# Patient Record
Sex: Male | Born: 1935 | Race: White | Hispanic: No | Marital: Married | Smoking: Never smoker
Health system: Southern US, Community
[De-identification: ages and names within clinical notes are randomized; demographics above are authoritative.]

## PROBLEM LIST (undated history)

## (undated) DIAGNOSIS — L409 Psoriasis, unspecified: Secondary | ICD-10-CM

## (undated) DIAGNOSIS — K219 Gastro-esophageal reflux disease without esophagitis: Secondary | ICD-10-CM

## (undated) DIAGNOSIS — I6509 Occlusion and stenosis of unspecified vertebral artery: Secondary | ICD-10-CM

## (undated) DIAGNOSIS — R55 Syncope and collapse: Secondary | ICD-10-CM

## (undated) DIAGNOSIS — I499 Cardiac arrhythmia, unspecified: Secondary | ICD-10-CM

## (undated) DIAGNOSIS — G43909 Migraine, unspecified, not intractable, without status migrainosus: Secondary | ICD-10-CM

## (undated) DIAGNOSIS — D039 Melanoma in situ, unspecified: Secondary | ICD-10-CM

## (undated) DIAGNOSIS — IMO0001 Reserved for inherently not codable concepts without codable children: Secondary | ICD-10-CM

## (undated) DIAGNOSIS — K635 Polyp of colon: Secondary | ICD-10-CM

## (undated) DIAGNOSIS — I493 Ventricular premature depolarization: Secondary | ICD-10-CM

## (undated) DIAGNOSIS — C801 Malignant (primary) neoplasm, unspecified: Secondary | ICD-10-CM

## (undated) DIAGNOSIS — I10 Essential (primary) hypertension: Secondary | ICD-10-CM

## (undated) DIAGNOSIS — G458 Other transient cerebral ischemic attacks and related syndromes: Secondary | ICD-10-CM

## (undated) DIAGNOSIS — Z87442 Personal history of urinary calculi: Secondary | ICD-10-CM

## (undated) DIAGNOSIS — J309 Allergic rhinitis, unspecified: Secondary | ICD-10-CM

## (undated) DIAGNOSIS — H269 Unspecified cataract: Secondary | ICD-10-CM

## (undated) DIAGNOSIS — K649 Unspecified hemorrhoids: Secondary | ICD-10-CM

## (undated) DIAGNOSIS — M109 Gout, unspecified: Secondary | ICD-10-CM

## (undated) DIAGNOSIS — D696 Thrombocytopenia, unspecified: Secondary | ICD-10-CM

## (undated) DIAGNOSIS — B019 Varicella without complication: Secondary | ICD-10-CM

## (undated) DIAGNOSIS — K2270 Barrett's esophagus without dysplasia: Secondary | ICD-10-CM

## (undated) DIAGNOSIS — R7989 Other specified abnormal findings of blood chemistry: Secondary | ICD-10-CM

## (undated) DIAGNOSIS — Z8673 Personal history of transient ischemic attack (TIA), and cerebral infarction without residual deficits: Secondary | ICD-10-CM

## (undated) DIAGNOSIS — H509 Unspecified strabismus: Secondary | ICD-10-CM

## (undated) DIAGNOSIS — N200 Calculus of kidney: Secondary | ICD-10-CM

## (undated) DIAGNOSIS — K7689 Other specified diseases of liver: Secondary | ICD-10-CM

## (undated) DIAGNOSIS — S065XAA Traumatic subdural hemorrhage with loss of consciousness status unknown, initial encounter: Secondary | ICD-10-CM

## (undated) DIAGNOSIS — R161 Splenomegaly, not elsewhere classified: Secondary | ICD-10-CM

## (undated) DIAGNOSIS — H532 Diplopia: Secondary | ICD-10-CM

## (undated) DIAGNOSIS — H50112 Monocular exotropia, left eye: Secondary | ICD-10-CM

## (undated) DIAGNOSIS — H5010 Unspecified exotropia: Secondary | ICD-10-CM

## (undated) DIAGNOSIS — D369 Benign neoplasm, unspecified site: Secondary | ICD-10-CM

## (undated) HISTORY — DX: Other specified diseases of liver: K76.89

## (undated) HISTORY — DX: Unspecified cataract: H26.9

## (undated) HISTORY — DX: Splenomegaly, not elsewhere classified: R16.1

## (undated) HISTORY — DX: Migraine, unspecified, not intractable, without status migrainosus: G43.909

## (undated) HISTORY — DX: Other transient cerebral ischemic attacks and related syndromes: G45.8

## (undated) HISTORY — DX: Benign neoplasm, unspecified site: D36.9

## (undated) HISTORY — DX: Cardiac arrhythmia, unspecified: I49.9

## (undated) HISTORY — DX: Syncope and collapse: R55

## (undated) HISTORY — DX: Unspecified strabismus: H50.9

## (undated) HISTORY — DX: Barrett's esophagus without dysplasia: K22.70

## (undated) HISTORY — DX: Psoriasis, unspecified: L40.9

## (undated) HISTORY — DX: Varicella without complication: B01.9

## (undated) HISTORY — DX: Gastro-esophageal reflux disease without esophagitis: K21.9

## (undated) HISTORY — DX: Personal history of transient ischemic attack (TIA), and cerebral infarction without residual deficits: Z86.73

## (undated) HISTORY — DX: Unspecified hemorrhoids: K64.9

## (undated) HISTORY — DX: Diplopia: H53.2

## (undated) HISTORY — DX: Ventricular premature depolarization: I49.3

## (undated) HISTORY — DX: Calculus of kidney: N20.0

## (undated) HISTORY — DX: Gout, unspecified: M10.9

## (undated) HISTORY — DX: Reserved for inherently not codable concepts without codable children: IMO0001

## (undated) HISTORY — DX: Other specified abnormal findings of blood chemistry: R79.89

## (undated) HISTORY — DX: Polyp of colon: K63.5

## (undated) HISTORY — PX: WISDOM TOOTH EXTRACTION: SHX21

## (undated) HISTORY — DX: Allergic rhinitis, unspecified: J30.9

## (undated) HISTORY — PX: COLONOSCOPY: SHX174

## (undated) HISTORY — DX: Traumatic subdural hemorrhage with loss of consciousness status unknown, initial encounter: S06.5XAA

## (undated) HISTORY — DX: Essential (primary) hypertension: I10

## (undated) HISTORY — DX: Monocular exotropia, left eye: H50.112

## (undated) HISTORY — DX: Melanoma in situ, unspecified: D03.9

## (undated) HISTORY — PX: MOHS SURGERY: SUR867

## (undated) HISTORY — DX: Occlusion and stenosis of unspecified vertebral artery: I65.09

## (undated) HISTORY — PX: TONSILLECTOMY: SUR1361

## (undated) HISTORY — PX: ESOPHAGOGASTRODUODENOSCOPY: SHX1529

## (undated) HISTORY — DX: Thrombocytopenia, unspecified: D69.6

## (undated) HISTORY — DX: Unspecified exotropia: H50.10

## (undated) HISTORY — PX: APPENDECTOMY: SHX54

## (undated) HISTORY — PX: OTHER SURGICAL HISTORY: SHX169

---

## 2003-08-16 HISTORY — PX: EYE SURGERY: SHX253

## 2003-10-18 ENCOUNTER — Other Ambulatory Visit: Payer: Self-pay

## 2005-05-19 ENCOUNTER — Emergency Department: Payer: Self-pay | Admitting: Unknown Physician Specialty

## 2006-04-18 ENCOUNTER — Ambulatory Visit: Payer: Self-pay | Admitting: Gastroenterology

## 2007-07-28 ENCOUNTER — Emergency Department: Payer: Self-pay | Admitting: Emergency Medicine

## 2007-07-31 ENCOUNTER — Emergency Department: Payer: Self-pay | Admitting: Emergency Medicine

## 2007-08-04 ENCOUNTER — Emergency Department: Payer: Self-pay | Admitting: Emergency Medicine

## 2007-08-11 ENCOUNTER — Emergency Department: Payer: Self-pay | Admitting: Emergency Medicine

## 2007-08-25 ENCOUNTER — Emergency Department: Payer: Self-pay | Admitting: Emergency Medicine

## 2008-06-21 ENCOUNTER — Ambulatory Visit: Payer: Self-pay | Admitting: Gastroenterology

## 2010-09-03 DIAGNOSIS — D369 Benign neoplasm, unspecified site: Secondary | ICD-10-CM

## 2010-09-03 HISTORY — DX: Benign neoplasm, unspecified site: D36.9

## 2010-12-29 ENCOUNTER — Ambulatory Visit: Payer: Self-pay | Admitting: Gastroenterology

## 2011-01-01 LAB — PATHOLOGY REPORT

## 2012-08-12 ENCOUNTER — Ambulatory Visit: Payer: Self-pay | Admitting: Gastroenterology

## 2012-08-12 LAB — CBC WITH DIFFERENTIAL/PLATELET
Basophil %: 1.2 %
Eosinophil %: 2.9 %
HGB: 17.8 g/dL (ref 13.0–18.0)
Lymphocyte #: 1 10*3/uL (ref 1.0–3.6)
Lymphocyte %: 16.5 %
MCH: 31.2 pg (ref 26.0–34.0)
MCV: 90 fL (ref 80–100)
Monocyte #: 0.6 x10 3/mm (ref 0.2–1.0)
Monocyte %: 9.4 %
Neutrophil %: 70 %
RBC: 5.7 10*6/uL (ref 4.40–5.90)
RDW: 13.2 % (ref 11.5–14.5)

## 2012-08-13 LAB — PATHOLOGY REPORT

## 2014-03-04 ENCOUNTER — Ambulatory Visit: Payer: Self-pay | Admitting: Gastroenterology

## 2014-03-25 ENCOUNTER — Ambulatory Visit: Payer: Self-pay | Admitting: Gastroenterology

## 2014-06-03 ENCOUNTER — Observation Stay: Payer: Self-pay | Admitting: Internal Medicine

## 2014-06-03 LAB — BASIC METABOLIC PANEL
Anion Gap: 5 — ABNORMAL LOW (ref 7–16)
BUN: 18 mg/dL (ref 7–18)
Calcium, Total: 8.2 mg/dL — ABNORMAL LOW (ref 8.5–10.1)
Chloride: 106 mmol/L (ref 98–107)
Co2: 31 mmol/L (ref 21–32)
Creatinine: 1.24 mg/dL (ref 0.60–1.30)
EGFR (African American): >60
EGFR (Non-African Amer.): 60 — ABNORMAL LOW
GLUCOSE: 121 mg/dL — AB (ref 65–99)
Osmolality: 286 (ref 275–301)
Potassium: 3.7 mmol/L (ref 3.5–5.1)
Sodium: 142 mmol/L (ref 136–145)

## 2014-06-03 LAB — CBC WITH DIFFERENTIAL/PLATELET
BASOS PCT: 0.3 %
Basophil #: 0 10*3/uL (ref 0.0–0.1)
EOS ABS: 0.1 10*3/uL (ref 0.0–0.7)
Eosinophil %: 2.3 %
HCT: 53.8 % — AB (ref 40.0–52.0)
HGB: 17.5 g/dL (ref 13.0–18.0)
LYMPHS ABS: 0.6 10*3/uL — AB (ref 1.0–3.6)
LYMPHS PCT: 12.5 %
MCH: 30.6 pg (ref 26.0–34.0)
MCHC: 32.4 g/dL (ref 32.0–36.0)
MCV: 94 fL (ref 80–100)
MONOS PCT: 8 %
Monocyte #: 0.4 x10 3/mm (ref 0.2–1.0)
NEUTROS ABS: 3.9 10*3/uL (ref 1.4–6.5)
Neutrophil %: 76.9 %
Platelet: 99 10*3/uL — ABNORMAL LOW (ref 150–440)
RBC: 5.71 10*6/uL (ref 4.40–5.90)
RDW: 13.8 % (ref 11.5–14.5)
WBC: 5.1 10*3/uL (ref 3.8–10.6)

## 2014-06-03 LAB — TROPONIN I

## 2014-06-03 LAB — URINALYSIS, COMPLETE
BACTERIA: NONE SEEN
Bilirubin,UR: NEGATIVE
Blood: NEGATIVE
GLUCOSE, UR: NEGATIVE mg/dL (ref 0–75)
KETONE: NEGATIVE
Leukocyte Esterase: NEGATIVE
NITRITE: NEGATIVE
PROTEIN: NEGATIVE
Ph: 5 (ref 4.5–8.0)
RBC,UR: 1 /HPF (ref 0–5)
Specific Gravity: 1.013 (ref 1.003–1.030)
Squamous Epithelial: NONE SEEN

## 2014-06-04 DIAGNOSIS — R55 Syncope and collapse: Secondary | ICD-10-CM

## 2014-06-04 DIAGNOSIS — G458 Other transient cerebral ischemic attacks and related syndromes: Secondary | ICD-10-CM

## 2014-06-04 DIAGNOSIS — I361 Nonrheumatic tricuspid (valve) insufficiency: Secondary | ICD-10-CM

## 2014-06-04 LAB — TSH: THYROID STIMULATING HORM: 2.65 u[IU]/mL

## 2014-06-04 LAB — LIPID PANEL
Cholesterol: 109 mg/dL (ref 0–200)
HDL Cholesterol: 23 mg/dL — ABNORMAL LOW (ref 40–60)
Ldl Cholesterol, Calc: 54 mg/dL (ref 0–100)
TRIGLYCERIDES: 160 mg/dL (ref 0–200)
VLDL CHOLESTEROL, CALC: 32 mg/dL (ref 5–40)

## 2014-06-04 LAB — HEMOGLOBIN A1C: HEMOGLOBIN A1C: 5.4 % (ref 4.2–6.3)

## 2014-06-04 LAB — TROPONIN I: Troponin-I: <0.02 ng/mL

## 2014-06-10 ENCOUNTER — Ambulatory Visit (INDEPENDENT_AMBULATORY_CARE_PROVIDER_SITE_OTHER): Payer: Medicare Other | Admitting: Physician Assistant

## 2014-06-10 ENCOUNTER — Encounter: Payer: Self-pay | Admitting: Physician Assistant

## 2014-06-10 VITALS — BP 120/90 | HR 88 | Ht 71.0 in | Wt 168.0 lb

## 2014-06-10 DIAGNOSIS — D696 Thrombocytopenia, unspecified: Secondary | ICD-10-CM

## 2014-06-10 DIAGNOSIS — G458 Other transient cerebral ischemic attacks and related syndromes: Secondary | ICD-10-CM

## 2014-06-10 DIAGNOSIS — R55 Syncope and collapse: Secondary | ICD-10-CM

## 2014-06-10 MED ORDER — CARVEDILOL 3.125 MG PO TABS: 3.1250 mg | ORAL_TABLET | Freq: Two times a day (BID) | ORAL | Status: DC

## 2014-06-10 NOTE — Patient Instructions (Signed)
Please start Carvedilol 3.125mg  twice daily  Please keep your appt w/ vascular this afternoon  Please follow up with Dr. Rockey Situ in 1 month
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
## 2014-06-14 ENCOUNTER — Encounter: Payer: Self-pay | Admitting: Cardiovascular Disease

## 2014-06-16 ENCOUNTER — Ambulatory Visit: Payer: Self-pay | Admitting: Vascular Surgery

## 2014-07-12 ENCOUNTER — Ambulatory Visit (INDEPENDENT_AMBULATORY_CARE_PROVIDER_SITE_OTHER): Payer: Medicare Other | Admitting: Cardiovascular Disease

## 2014-07-12 ENCOUNTER — Encounter: Payer: Self-pay | Admitting: Cardiovascular Disease

## 2014-07-12 VITALS — BP 150/80 | HR 82 | Ht 71.0 in | Wt 173.8 lb

## 2014-07-12 DIAGNOSIS — I499 Cardiac arrhythmia, unspecified: Secondary | ICD-10-CM

## 2014-07-12 DIAGNOSIS — I493 Ventricular premature depolarization: Secondary | ICD-10-CM | POA: Insufficient documentation

## 2014-07-12 DIAGNOSIS — I6501 Occlusion and stenosis of right vertebral artery: Secondary | ICD-10-CM

## 2014-07-12 DIAGNOSIS — I6509 Occlusion and stenosis of unspecified vertebral artery: Secondary | ICD-10-CM | POA: Insufficient documentation

## 2014-07-12 DIAGNOSIS — R55 Syncope and collapse: Secondary | ICD-10-CM | POA: Insufficient documentation

## 2014-07-12 DIAGNOSIS — I491 Atrial premature depolarization: Secondary | ICD-10-CM | POA: Insufficient documentation

## 2014-07-12 NOTE — Assessment & Plan Note (Signed)
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 He has had follow-up with vascular surgery. Medical management recommended. This is likely the cause of his recent near syncope episode. No further workup needed at this time.
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
## 2014-07-12 NOTE — Assessment & Plan Note (Signed)
Holter monitor reviewed with him in detail. Ectopy shown to him. He does not want beta blockers at this time as he is asymptomatic

## 2014-07-12 NOTE — Patient Instructions (Signed)
You are doing well. No medication changes were made.  Please call for more episodes of lightheadedness,  We could order a 30 day monitor  Please track your blood pressure (we would start a b-blocker if needed)  Please call us if you have new issues that need to be addressed before your next appt.  Your physician wants you to follow-up in: 12 months.  You will receive a reminder letter in the mail two months in advance. If you don't receive a letter, please call our office to schedule the follow-up appointment.  Your next appointment will be scheduled in our new office located at :  [facility]  [address], [place]  [place],  [address]

## 2014-07-12 NOTE — Assessment & Plan Note (Signed)
PVCs noted on Holter monitor and shown to him. Again he does not want beta blockers as he is asymptomatic. Would recommend 30 day monitor if he has additional symptoms concerning for arrhythmia

## 2014-07-12 NOTE — Assessment & Plan Note (Signed)
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 If he has additional episodes of near syncope or syncope, recommended he call our office. 30 day monitor could be ordered

## 2014-07-16 DIAGNOSIS — R55 Syncope and collapse: Secondary | ICD-10-CM | POA: Insufficient documentation

## 2014-07-16 DIAGNOSIS — I499 Cardiac arrhythmia, unspecified: Secondary | ICD-10-CM | POA: Insufficient documentation

## 2014-07-16 DIAGNOSIS — G458 Other transient cerebral ischemic attacks and related syndromes: Secondary | ICD-10-CM | POA: Insufficient documentation
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
## 2015-06-29 IMAGING — CT CT ANGIOGRAPHY NECK
2 of 7 series · 9 of 33 positions shown · IV contrast (APPLIED)
Comparison: Carotid ultrasound 06/03/2014

CLINICAL DATA: Acute subclavian steal syndrome. Syncope. Right arm
pain 1 year ago. Intermittent left-sided chest pain for a couple of
weeks.

EXAM:
CT ANGIOGRAPHY NECK
TECHNIQUE: Multidetector CT imaging of the neck was performed using the
standard protocol during bolus administration of intravenous
contrast. Multiplanar CT image reconstructions and MIPs were
obtained to evaluate the vascular anatomy. Carotid stenosis
measurements (when applicable) are obtained utilizing NASCET
criteria, using the distal internal carotid diameter as the
denominator.
CONTRAST:  80 mL Isovue 370

[Series 5: cta neck · axial · 0.75mm/px · z∈[-502,-374]mm · 3 of 172 slices shown]
[im 43/172  soft-tissue]
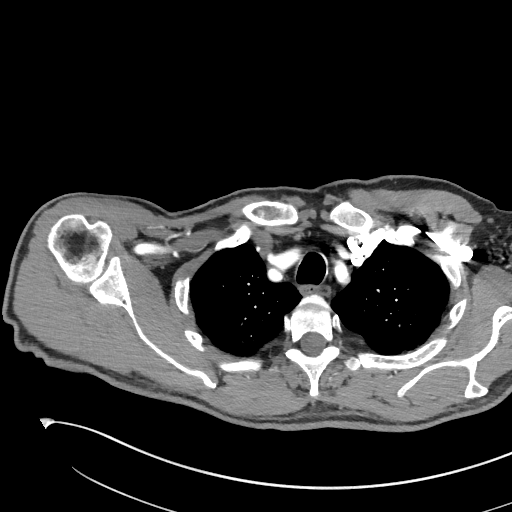
[im 86/172  soft-tissue]
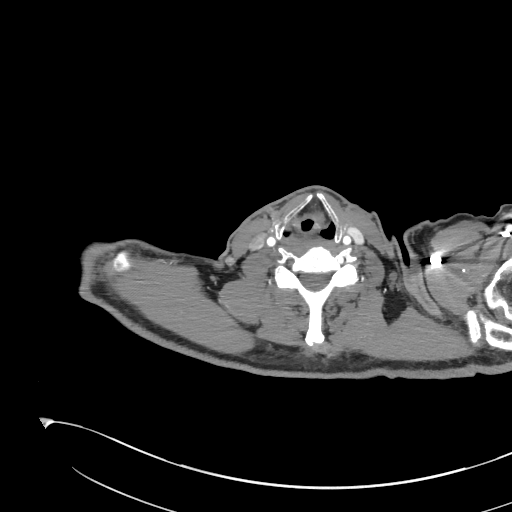
[im 129/172  soft-tissue]
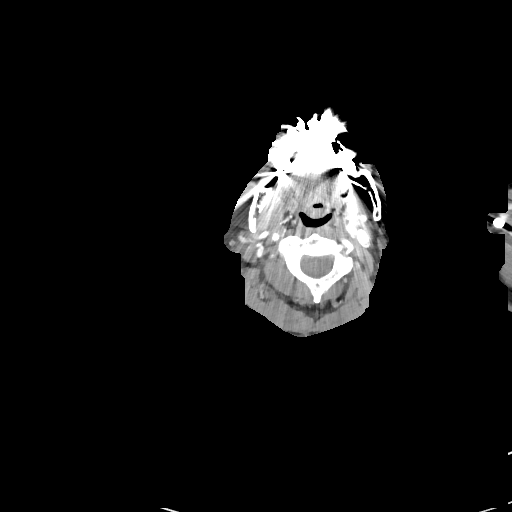

[Series 6: ax thin · axial · 0.39mm/px · z∈[-529,-347]mm · 6 of 256 slices shown]
[im 37/256  soft-tissue]
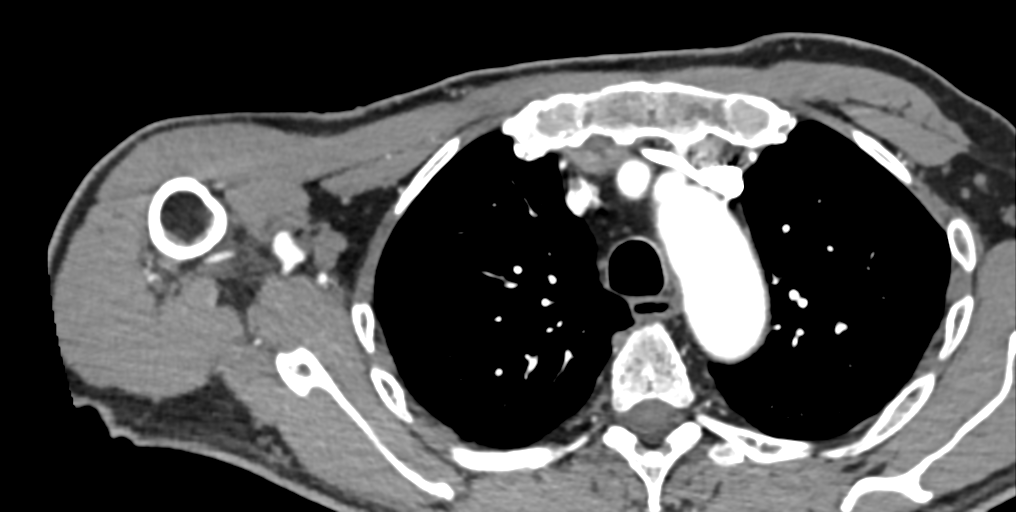
[im 73/256  bone]
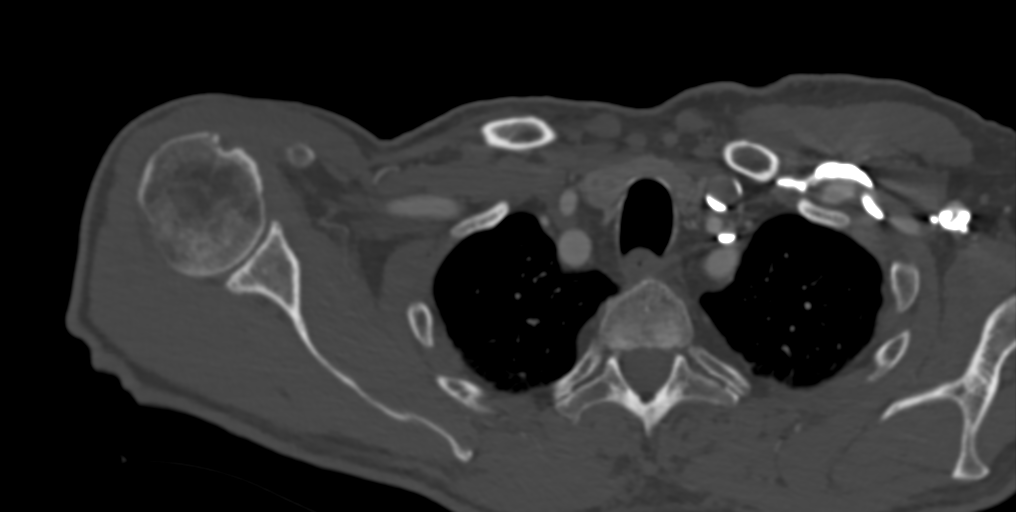
[im 110/256  soft-tissue]
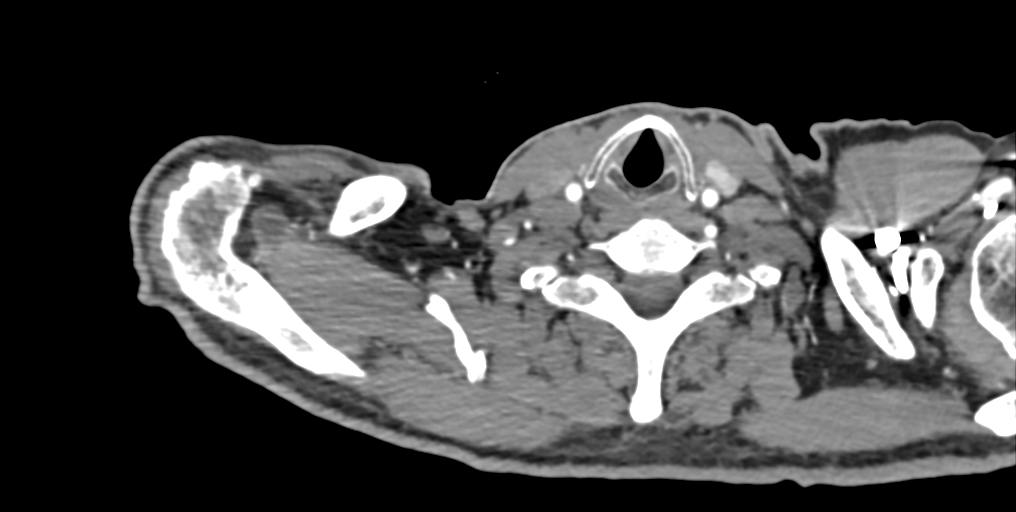
[im 146/256  bone]
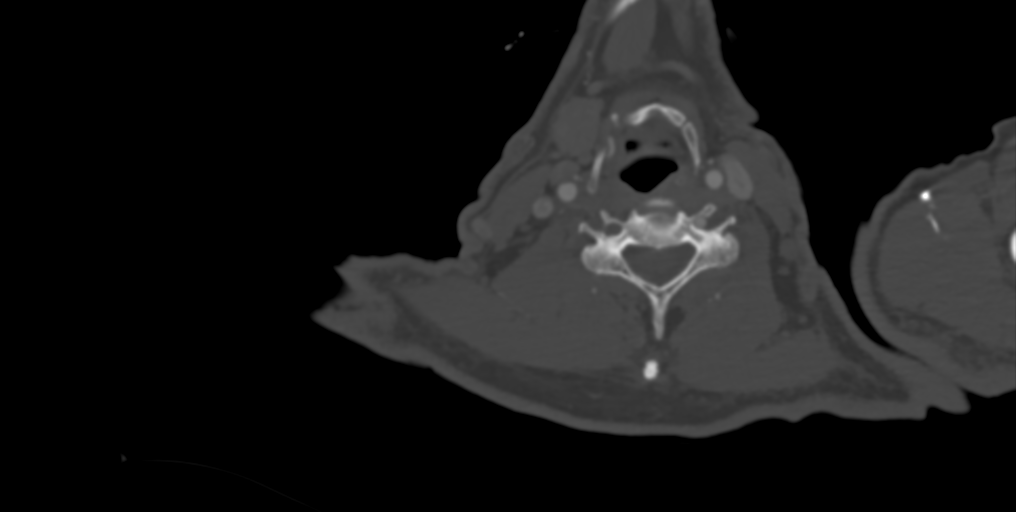
[im 183/256  soft-tissue]
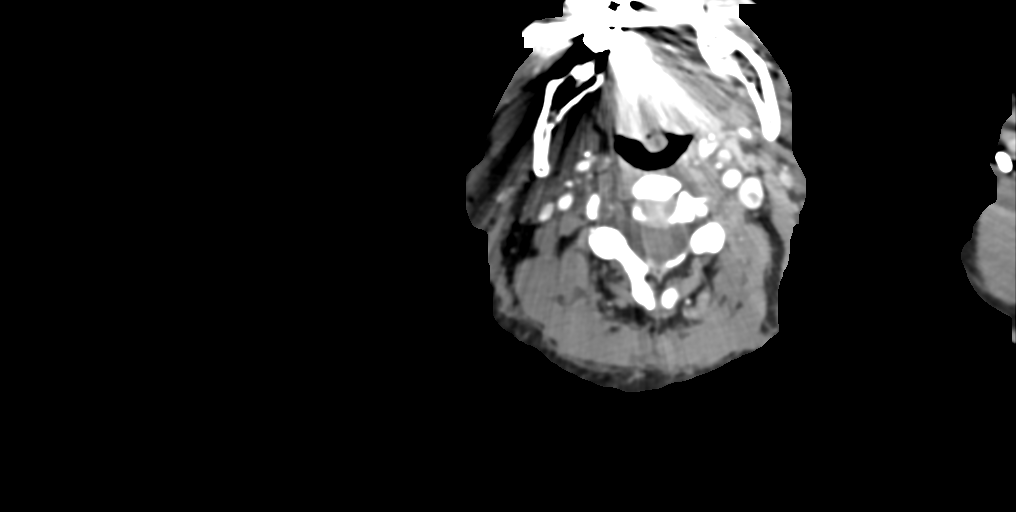
[im 219/256  bone]
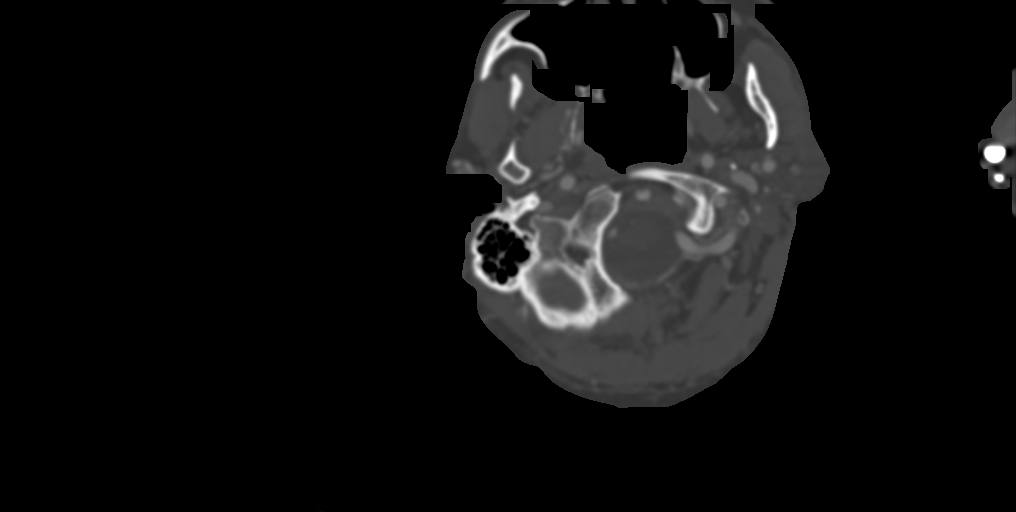

[9 of 33 positions shown; findings below may reference images not displayed]

FINDINGS: Three vessel aortic arch. Brachiocephalic artery is unremarkable.
There is mild luminal narrowing of the proximal right subclavian
artery due to vessel tortuosity, however there is no significant
atherosclerotic disease or associated stenosis, and the remainder of
the right subclavian artery is unremarkable. Left subclavian artery
is patent without stenosis. Common carotid and cervical internal
carotid arteries are patent without significant atherosclerotic
disease or stenosis.

Right vertebral artery is occluded at its origin. There is minimal,
intermittent reconstitution in its proximal V2 portion. The more
distal V2 portion is reconstituted at the C3 level with the V3 and
V4 segments being patent. This reconstitution may reflect retrograde
flow left given findings on ultrasound. The left vertebral artery is
patent and dominant without stenosis.

Lungs are are more fully evaluated on separate chest CT. There is
moderate multilevel cervical disc degeneration and facet arthrosis.

Review of the MIP images confirms the above findings.
IMPRESSION: 1. Occlusion of the right vertebral artery at its origin with
partial distal reconstitution/retrograde filling.
2. No subclavian artery stenosis.
3. Patent and dominant left vertebral artery without stenosis.
4. Unremarkable appearance of the common carotid and cervical
internal carotid arteries.

## 2015-07-13 ENCOUNTER — Encounter (INDEPENDENT_AMBULATORY_CARE_PROVIDER_SITE_OTHER): Payer: Self-pay

## 2015-07-13 ENCOUNTER — Encounter: Payer: Self-pay | Admitting: Cardiovascular Disease

## 2015-07-13 ENCOUNTER — Ambulatory Visit (INDEPENDENT_AMBULATORY_CARE_PROVIDER_SITE_OTHER): Payer: Medicare Other | Admitting: Cardiovascular Disease

## 2015-07-13 VITALS — BP 148/80 | HR 74 | Ht 71.0 in | Wt 171.5 lb

## 2015-07-13 DIAGNOSIS — I6501 Occlusion and stenosis of right vertebral artery: Secondary | ICD-10-CM | POA: Diagnosis not present

## 2015-07-13 DIAGNOSIS — I493 Ventricular premature depolarization: Secondary | ICD-10-CM | POA: Diagnosis not present

## 2015-07-13 DIAGNOSIS — I498 Other specified cardiac arrhythmias: Secondary | ICD-10-CM

## 2015-07-13 DIAGNOSIS — I499 Cardiac arrhythmia, unspecified: Secondary | ICD-10-CM | POA: Diagnosis not present

## 2015-07-13 DIAGNOSIS — R55 Syncope and collapse: Secondary | ICD-10-CM

## 2015-07-13 DIAGNOSIS — I491 Atrial premature depolarization: Secondary | ICD-10-CM

## 2015-07-13 MED ORDER — CARVEDILOL 3.125 MG PO TABS: 3.1250 mg | ORAL_TABLET | Freq: Two times a day (BID) | ORAL | Status: DC

## 2015-07-13 NOTE — Patient Instructions (Signed)
You are doing well. No medication changes were made.  Please call us if you have new issues that need to be addressed before your next appt.  Your physician wants you to follow-up in: 12 months.  You will receive a reminder letter in the mail two months in advance. If you don't receive a letter, please call our office to schedule the follow-up appointment. 

## 2015-07-13 NOTE — Assessment & Plan Note (Signed)
No further episodes of near syncope or syncope. If he has recurrent symptoms, would repeat head scan with contrast, consider 30 day monitor

## 2015-07-13 NOTE — Assessment & Plan Note (Signed)
Currently asymptomatic from his ectopy. None seen on EKG today  Carvedilol renewed and suggested he take this if he becomes symptomatic  would monitor his blood pressure

## 2015-07-13 NOTE — Assessment & Plan Note (Signed)
Etiology of vertebral artery obstruction unclear, previously seen by vascular surgery  Images reviewed on his last clinic visit with no atherosclerotic plaque seen in his carotids  With low cholesterol, currently not on a statin
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
## 2015-10-20 DIAGNOSIS — Z9889 Other specified postprocedural states: Secondary | ICD-10-CM | POA: Insufficient documentation

## 2015-10-20 DIAGNOSIS — I6782 Cerebral ischemia: Secondary | ICD-10-CM | POA: Insufficient documentation

## 2015-10-20 DIAGNOSIS — I10 Essential (primary) hypertension: Secondary | ICD-10-CM | POA: Insufficient documentation

## 2015-10-20 DIAGNOSIS — G939 Disorder of brain, unspecified: Secondary | ICD-10-CM | POA: Insufficient documentation

## 2015-10-20 HISTORY — PX: STRABISMUS SURGERY: SHX218

## 2016-01-31 ENCOUNTER — Encounter: Payer: Self-pay | Admitting: Urology

## 2016-01-31 ENCOUNTER — Ambulatory Visit (INDEPENDENT_AMBULATORY_CARE_PROVIDER_SITE_OTHER): Payer: Medicare Other | Admitting: Urology

## 2016-01-31 VITALS — BP 131/69 | HR 63 | Ht 70.0 in | Wt 165.8 lb

## 2016-01-31 DIAGNOSIS — R351 Nocturia: Secondary | ICD-10-CM | POA: Diagnosis not present

## 2016-01-31 DIAGNOSIS — R3912 Poor urinary stream: Secondary | ICD-10-CM

## 2016-01-31 DIAGNOSIS — J309 Allergic rhinitis, unspecified: Secondary | ICD-10-CM | POA: Insufficient documentation

## 2016-01-31 DIAGNOSIS — H5010 Unspecified exotropia: Secondary | ICD-10-CM | POA: Insufficient documentation

## 2016-01-31 DIAGNOSIS — D039 Melanoma in situ, unspecified: Secondary | ICD-10-CM | POA: Insufficient documentation

## 2016-01-31 DIAGNOSIS — K2270 Barrett's esophagus without dysplasia: Secondary | ICD-10-CM | POA: Insufficient documentation

## 2016-01-31 DIAGNOSIS — E349 Endocrine disorder, unspecified: Secondary | ICD-10-CM | POA: Insufficient documentation

## 2016-01-31 DIAGNOSIS — K209 Esophagitis, unspecified: Secondary | ICD-10-CM

## 2016-01-31 DIAGNOSIS — N200 Calculus of kidney: Secondary | ICD-10-CM | POA: Insufficient documentation

## 2016-01-31 DIAGNOSIS — G43909 Migraine, unspecified, not intractable, without status migrainosus: Secondary | ICD-10-CM | POA: Insufficient documentation

## 2016-01-31 DIAGNOSIS — K649 Unspecified hemorrhoids: Secondary | ICD-10-CM | POA: Insufficient documentation

## 2016-01-31 LAB — URINALYSIS, COMPLETE
Bilirubin, UA: NEGATIVE
Glucose, UA: NEGATIVE
KETONES UA: NEGATIVE
Leukocytes, UA: NEGATIVE
NITRITE UA: NEGATIVE
Protein, UA: NEGATIVE
RBC UA: NEGATIVE
SPEC GRAV UA: 1.02 (ref 1.005–1.030)
UUROB: 0.2 mg/dL (ref 0.2–1.0)
pH, UA: 5.5 (ref 5.0–7.5)

## 2016-01-31 LAB — MICROSCOPIC EXAMINATION
Bacteria, UA: NONE SEEN
Epithelial Cells (non renal): NONE SEEN /hpf (ref 0–10)

## 2016-01-31 LAB — BLADDER SCAN AMB NON-IMAGING: SCAN RESULT: 0

## 2016-01-31 MED ORDER — TAMSULOSIN HCL 0.4 MG PO CAPS: 0.4000 mg | ORAL_CAPSULE | Freq: Every day | ORAL | Status: DC

## 2016-01-31 NOTE — Progress Notes (Signed)
01/31/2016 10:36 AM   [name] [birth year] [id number]  Referring provider: [name], MD [phone] [place], [place] [address]  Chief Complaint  Patient presents with  . New Patient (Initial Visit)    BPH, weak stream    HPI: Mr [name] is a 82yo seen today for weak urinary stream and nocturia. His symptoms have been worsening over the past year. He has nocturia 1x and hesitancy. No BPH meds He is on androgel and per patient his PSA was 0.69 in 01/2016. AUA symptoms score was 10 with bother of 5. NO hx of prostate infections.  No has no issues with erections.   PVR is 26cc.    PMH: Past Medical History  Diagnosis Date  . Reflux   . Near syncope   . Subclavian steal syndrome   . Arrhythmia   . Syncope and collapse     Surgical History: Past Surgical History  Procedure Laterality Date  . Appendectomy    . Strabismus      Home Medications:    Medication List       This list is accurate as of: 01/31/16 10:36 AM.  Always use your most recent med list.               ANDROGEL PUMP 20.25 MG/ACT (1.62%) Gel  Generic drug:  Testosterone  20.5 mg daily.     MULTI-VITAMINS Tabs  Take by mouth daily.     omeprazole 20 MG capsule  Commonly known as:  PRILOSEC  Take 20 mg by mouth daily.     valACYclovir 1000 MG tablet  Commonly known as:  VALTREX  Take 500 mg by mouth daily.     vitamin C 1000 MG tablet  Take by mouth.        Allergies:  Allergies  Allergen Reactions  . Other Diarrhea    Other reaction(s): Abdominal Pain tarragon    Family History: Family History  Problem Relation Age of Onset  . Heart failure Mother   . Heart attack Father     Social History:  reports that he quit smoking about 55 years ago. His smoking use included Cigarettes. He has a 2.5 pack-year smoking history. He does not have any smokeless tobacco history on file. He reports that he drinks alcohol. He reports that he does not use illicit  drugs.  ROS: UROLOGY Frequent Urination?: No Hard to postpone urination?: No Burning/pain with urination?: No Get up at night to urinate?: Yes Leakage of urine?: No Urine stream starts and stops?: Yes Trouble starting stream?: Yes Do you have to strain to urinate?: No Blood in urine?: No Urinary tract infection?: No Sexually transmitted disease?: No Injury to kidneys or bladder?: No Painful intercourse?: No Weak stream?: Yes Erection problems?: No Penile pain?: No  Gastrointestinal Nausea?: No Vomiting?: No Indigestion/heartburn?: No Diarrhea?: Yes Constipation?: No  Constitutional Fever: No Night sweats?: No Weight loss?: Yes Fatigue?: No  Skin Skin rash/lesions?: No Itching?: No  Eyes Blurred vision?: No Double vision?: Yes  Ears/Nose/Throat Sore throat?: No Sinus problems?: Yes  Hematologic/Lymphatic Swollen glands?: No Easy bruising?: No  Cardiovascular Leg swelling?: No Chest pain?: No  Respiratory Cough?: No Shortness of breath?: No  Endocrine Excessive thirst?: No  Musculoskeletal Back pain?: Yes Joint pain?: No  Neurological Headaches?: No Dizziness?: No  Psychologic Depression?: No Anxiety?: No  Physical Exam: BP 131/69 mmHg  Pulse 63  Ht 5\' 10"  (1.778 m)  Wt 75.206 kg (165 lb 12.8 oz)  BMI 23.79  kg/m2  Constitutional:  Alert and oriented, No acute distress. HEENT: [place] AT, moist mucus membranes.  Trachea midline, no masses. Cardiovascular: No clubbing, cyanosis, or edema. Respiratory: Normal respiratory effort, no increased work of breathing. GI: Abdomen is soft, nontender, nondistended, no abdominal masses GU: No CVA tenderness. No masses/lesions on penis, testes, scrotum. Circumcised. Prostate 60g, smooth, no nodules, no induration. Normal rectal tone Skin: No rashes, bruises or suspicious lesions. Lymph: No cervical or inguinal adenopathy. Neurologic: Grossly intact, no focal deficits, moving all 4  extremities. Psychiatric: Normal mood and affect.  Laboratory Data: Lab Results  Component Value Date   WBC 5.1 06/03/2014   HGB 17.5 06/03/2014   HCT 53.8* 06/03/2014   MCV 94 06/03/2014   PLT 99* 06/03/2014    Lab Results  Component Value Date   CREATININE 1.24 06/03/2014    No results found for: PSA  No results found for: TESTOSTERONE  Lab Results  Component Value Date   HGBA1C 5.4 06/04/2014    Urinalysis    Component Value Date/Time   COLORURINE Yellow 06/03/2014 1536   APPEARANCEUR Clear 06/03/2014 1536   LABSPEC 1.013 06/03/2014 1536   PHURINE 5.0 06/03/2014 1536   GLUCOSEU Negative 06/03/2014 1536   HGBUR Negative 06/03/2014 1536   BILIRUBINUR Negative 06/03/2014 1536   KETONESUR Negative 06/03/2014 1536   PROTEINUR Negative 06/03/2014 1536   NITRITE Negative 06/03/2014 1536   LEUKOCYTESUR Negative 06/03/2014 1536    Pertinent Imaging: none  Assessment & Plan:    1. Weak urinary stream -flomax 0.4mg   - Urinalysis, Complete - Bladder Scan (Post Void Residual) in office - RTC 6 weeks with flomax PVR  2. Nocturia -fluid management - Urinalysis, Complete - Bladder Scan (Post Void Residual) in office   No Follow-up on file.  Nicolette Bang, MD  [facility] [address], [place], [place] [address] [phone]

## 2016-02-02 ENCOUNTER — Inpatient Hospital Stay: Payer: Medicare Other | Attending: Internal Medicine | Admitting: Internal Medicine

## 2016-02-02 ENCOUNTER — Encounter: Payer: Self-pay | Admitting: *Deleted

## 2016-02-02 VITALS — BP 143/88 | HR 67 | Temp 98.0°F | Resp 20 | Ht 69.0 in | Wt 166.1 lb

## 2016-02-02 DIAGNOSIS — K219 Gastro-esophageal reflux disease without esophagitis: Secondary | ICD-10-CM | POA: Diagnosis not present

## 2016-02-02 DIAGNOSIS — Z86010 Personal history of colonic polyps: Secondary | ICD-10-CM | POA: Diagnosis not present

## 2016-02-02 DIAGNOSIS — I6509 Occlusion and stenosis of unspecified vertebral artery: Secondary | ICD-10-CM

## 2016-02-02 DIAGNOSIS — R160 Hepatomegaly, not elsewhere classified: Secondary | ICD-10-CM | POA: Insufficient documentation

## 2016-02-02 DIAGNOSIS — G458 Other transient cerebral ischemic attacks and related syndromes: Secondary | ICD-10-CM | POA: Insufficient documentation

## 2016-02-02 DIAGNOSIS — L409 Psoriasis, unspecified: Secondary | ICD-10-CM | POA: Insufficient documentation

## 2016-02-02 DIAGNOSIS — I499 Cardiac arrhythmia, unspecified: Secondary | ICD-10-CM | POA: Insufficient documentation

## 2016-02-02 DIAGNOSIS — Z87442 Personal history of urinary calculi: Secondary | ICD-10-CM | POA: Diagnosis not present

## 2016-02-02 DIAGNOSIS — Z79899 Other long term (current) drug therapy: Secondary | ICD-10-CM | POA: Diagnosis not present

## 2016-02-02 DIAGNOSIS — Z8669 Personal history of other diseases of the nervous system and sense organs: Secondary | ICD-10-CM | POA: Diagnosis not present

## 2016-02-02 DIAGNOSIS — Z85828 Personal history of other malignant neoplasm of skin: Secondary | ICD-10-CM | POA: Diagnosis not present

## 2016-02-02 DIAGNOSIS — K2270 Barrett's esophagus without dysplasia: Secondary | ICD-10-CM | POA: Diagnosis not present

## 2016-02-02 DIAGNOSIS — I10 Essential (primary) hypertension: Secondary | ICD-10-CM

## 2016-02-02 DIAGNOSIS — Z87891 Personal history of nicotine dependence: Secondary | ICD-10-CM | POA: Insufficient documentation

## 2016-02-02 DIAGNOSIS — I493 Ventricular premature depolarization: Secondary | ICD-10-CM

## 2016-02-02 DIAGNOSIS — R161 Splenomegaly, not elsewhere classified: Secondary | ICD-10-CM | POA: Insufficient documentation

## 2016-02-02 DIAGNOSIS — D696 Thrombocytopenia, unspecified: Secondary | ICD-10-CM | POA: Diagnosis not present

## 2016-02-02 NOTE — Progress Notes (Signed)
[place] NOTE  Patient Care Team: [name], MD as PCP - General (Family Medicine)  CHIEF COMPLAINTS/PURPOSE OF CONSULTATION:   # 2013- CHRONIC ISOLATED THROMBOCYTOPENIA- 741-63; May 2017- 115 [place]- 2015- MILD splenomegaly.   # On testosterone supplementation  HISTORY OF PRESENTING ILLNESS:  [name] 82 y.o.  male has been referred to [place] for further evaluation of his isolated thrombocytopenia. Patient had multiple endoscopies-EGD- 4 Barrett's esophagus.  Patient denies any bleeding gums or bleeding nose. Denies easy bruising. He is not on any blood thinners. He does drink 4-5 cocktails each night. Denies any herbal medication.   ROS: A complete 10 point review of system is done which is negative except mentioned above in history of present illness  MEDICAL HISTORY:  Past Medical History  Diagnosis Date  . Reflux   . Near syncope   . Subclavian steal syndrome   . Arrhythmia   . Syncope and collapse   . Thrombocytopenia ([place])   . Exotropia, left eye   . Allergic rhinitis   . Hemorrhoid   . Nephrolithiasis   . Migraine   . 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 Low testosterone   . Melanoma in situ ([place])     left ear, right cheek  . Cardiac arrhythmia   . H/O: CVA (cerebrovascular accident)   . Ventricular premature depolarization   . Essential hypertension   . Vertebral artery stenosis   . Splenomegaly   . Hepatic cyst   . Adenomatous polyp 2012  . Cataract   . Chicken pox   . Colon polyp 12/29/10, 04/18/06, 01/05/03  . Diplopia   . GERD (gastroesophageal reflux disease)   . Gout   . Psoriasis   . Strabismus   . Nephrolithiasis     SURGICAL HISTORY: Past Surgical History  Procedure Laterality Date  . Appendectomy    . Eye surgery  08/16/2003    eye sug  . Esophagogastroduodenoscopy  08/2014, 06/14/2012  . Colonoscopy    . Mohs surgery  2011, 2012    left ear and right cheek  .  Strabismus surgery  10/20/2015    2 horizontal muscles-left    SOCIAL HISTORY: [place]; no smoking; 4-5 cocktails a week; retired from [employer].  Social History   Social History  . Marital Status: Married    Spouse Name: N/A  . Number of Children: N/A  . Years of Education: N/A   Occupational History  . Not on file.   Social History Main Topics  . Smoking status: Former Smoker -- 0.25 packs/day for 10 years    Types: Cigarettes    Quit date: 01/30/1961  . Smokeless tobacco: Never Used  . Alcohol Use: 0.0 oz/week    0 Standard drinks or equivalent per week     Comment: social drinker  . Drug Use: No  . Sexual Activity: Not on file   Other Topics Concern  . Not on file   Social History Narrative    FAMILY HISTORY: Family History  Problem Relation Age of Onset  . Heart failure Mother   . Heart attack Father   . Osteoporosis Mother   . Stroke Mother   . Gout Father   . Diabetes Mellitus II Father   . Renal Disease Father   . Alcohol abuse Father   . Osteoporosis Maternal Grandfather   . Osteoporosis Maternal Grandmother     ALLERGIES:  is allergic to other.  MEDICATIONS:  Current Outpatient Prescriptions  Medication Sig Dispense Refill  . Ascorbic Acid (VITAMIN C) 1000 MG tablet Take by mouth.    . Multiple Vitamin (MULTI-VITAMINS) TABS Take by mouth daily.     Marland Kitchen omeprazole (PRILOSEC) 20 MG capsule Take 20 mg by mouth daily.     . tamsulosin (FLOMAX) 0.4 MG CAPS capsule Take 1 capsule (0.4 mg total) by mouth daily after supper. 30 capsule 11  . Testosterone (ANDROGEL PUMP) 20.25 MG/ACT (1.62%) GEL 20.5 mg daily.     . valACYclovir (VALTREX) 1000 MG tablet Take 500 mg by mouth daily.      No current facility-administered medications for this visit.      Marland Kitchen  PHYSICAL EXAMINATION:   Filed Vitals:   02/02/16 0837  BP: 143/88  Pulse: 67  Temp: 98 F (36.7 C)   Filed Weights   02/02/16 0837  Weight: 166 lb 1.9 oz (75.35 kg)    GENERAL:  Well-nourished well-developed; Alert, no distress and comfortable.   Alone.  EYES: no pallor or icterus OROPHARYNX: no thrush or ulceration; good dentition  NECK: supple, no masses felt LYMPH:  no palpable lymphadenopathy in the cervical, axillary or inguinal regions LUNGS: clear to auscultation and  No wheeze or crackles HEART/CVS: regular rate & rhythm and no murmurs; No lower extremity edema ABDOMEN: abdomen soft, non-tender and normal bowel sounds Musculoskeletal:no cyanosis of digits and no clubbing  PSYCH: alert & oriented x 3 with fluent speech NEURO: no focal motor/sensory deficits SKIN:  no rashes or significant lesions  LABORATORY DATA:  I have reviewed the data as listed Lab Results  Component Value Date   WBC 5.1 06/03/2014   HGB 17.5 06/03/2014   HCT 53.8* 06/03/2014   MCV 94 06/03/2014   PLT 99* 06/03/2014   No results for input(s): NA, K, CL, CO2, GLUCOSE, BUN, CREATININE, CALCIUM, GFRNONAA, GFRAA, PROT, ALBUMIN, AST, ALT, ALKPHOS, BILITOT, BILIDIR, IBILI in the last 8760 hours.   ASSESSMENT & PLAN:   # Chronic thrombocytopenia isolated- 100 to 115. The etiology is unclear- the differential includes alcohol use; possibly from mild splenomegaly ITP versus primary bone marrow process like myelodysplastic syndrome especially given his age.  Patient is fairly asymptomatic at this time; he is not on any blood thinners.  I discussed the option of offering a bone marrow biopsy further evaluation however I think it's reasonable to hold it off  unless his platelets starts to drop further. I would also recommend further imaging of the abdomen if his counts get worse.  # Recommend checking CBC CMP and LDH monoclonal workup in approximately 4 months/2 days before the next visit. Depending upon his labs recommend further evaluation including a possible bone marrow biopsy.  All questions were answered. The patient knows to call the clinic with any problems, questions or  concerns.  # 45 minutes face-to-face with the patient discussing the above plan of care; more than 50% of time spent on counseling and coordination.  Thank you Dr. Gustavo Lah  for allowing me to participate in the care of your pleasant patient. Please do not hesitate to contact me with questions or concerns in the interim.    Cammie Sickle, MD 02/02/2016 8:48 AM

## 2016-02-02 NOTE — Progress Notes (Signed)
Patient here for initial visit. He states he has chronic back pain. No other concerns today.

## 2016-02-03 ENCOUNTER — Ambulatory Visit: Payer: Medicare Other | Admitting: Internal Medicine

## 2016-03-12 ENCOUNTER — Encounter: Payer: Self-pay | Admitting: *Deleted

## 2016-03-13 ENCOUNTER — Ambulatory Visit
Admission: RE | Admit: 2016-03-13 | Discharge: 2016-03-13 | Disposition: A | Discharge status: Home / Self Care | Payer: Medicare Other | Source: Ambulatory Visit | Attending: Gastroenterology | Admitting: Gastroenterology

## 2016-03-13 ENCOUNTER — Encounter: Payer: Self-pay | Admitting: *Deleted

## 2016-03-13 ENCOUNTER — Encounter
Admission: RE | Disposition: A | Discharge status: Home / Self Care | Payer: Self-pay | Source: Ambulatory Visit | Attending: Gastroenterology

## 2016-03-13 ENCOUNTER — Ambulatory Visit: Payer: Medicare Other | Admitting: Anesthesiology

## 2016-03-13 DIAGNOSIS — I8500 Esophageal varices without bleeding: Secondary | ICD-10-CM | POA: Insufficient documentation

## 2016-03-13 DIAGNOSIS — K219 Gastro-esophageal reflux disease without esophagitis: Secondary | ICD-10-CM | POA: Diagnosis not present

## 2016-03-13 DIAGNOSIS — G43909 Migraine, unspecified, not intractable, without status migrainosus: Secondary | ICD-10-CM | POA: Insufficient documentation

## 2016-03-13 DIAGNOSIS — K2270 Barrett's esophagus without dysplasia: Secondary | ICD-10-CM | POA: Insufficient documentation

## 2016-03-13 DIAGNOSIS — J309 Allergic rhinitis, unspecified: Secondary | ICD-10-CM | POA: Insufficient documentation

## 2016-03-13 DIAGNOSIS — Z85820 Personal history of malignant melanoma of skin: Secondary | ICD-10-CM | POA: Diagnosis not present

## 2016-03-13 DIAGNOSIS — I499 Cardiac arrhythmia, unspecified: Secondary | ICD-10-CM | POA: Insufficient documentation

## 2016-03-13 DIAGNOSIS — Z79899 Other long term (current) drug therapy: Secondary | ICD-10-CM | POA: Diagnosis not present

## 2016-03-13 DIAGNOSIS — I10 Essential (primary) hypertension: Secondary | ICD-10-CM | POA: Insufficient documentation

## 2016-03-13 DIAGNOSIS — K529 Noninfective gastroenteritis and colitis, unspecified: Secondary | ICD-10-CM | POA: Insufficient documentation

## 2016-03-13 DIAGNOSIS — K222 Esophageal obstruction: Secondary | ICD-10-CM | POA: Diagnosis not present

## 2016-03-13 DIAGNOSIS — K2950 Unspecified chronic gastritis without bleeding: Secondary | ICD-10-CM | POA: Insufficient documentation

## 2016-03-13 DIAGNOSIS — K766 Portal hypertension: Secondary | ICD-10-CM | POA: Insufficient documentation

## 2016-03-13 DIAGNOSIS — K3189 Other diseases of stomach and duodenum: Secondary | ICD-10-CM | POA: Insufficient documentation

## 2016-03-13 DIAGNOSIS — M109 Gout, unspecified: Secondary | ICD-10-CM | POA: Insufficient documentation

## 2016-03-13 DIAGNOSIS — K5730 Diverticulosis of large intestine without perforation or abscess without bleeding: Secondary | ICD-10-CM | POA: Insufficient documentation

## 2016-03-13 DIAGNOSIS — D696 Thrombocytopenia, unspecified: Secondary | ICD-10-CM | POA: Diagnosis not present

## 2016-03-13 DIAGNOSIS — Z8673 Personal history of transient ischemic attack (TIA), and cerebral infarction without residual deficits: Secondary | ICD-10-CM | POA: Diagnosis not present

## 2016-03-13 DIAGNOSIS — Z86010 Personal history of colonic polyps: Secondary | ICD-10-CM | POA: Diagnosis not present

## 2016-03-13 DIAGNOSIS — K621 Rectal polyp: Secondary | ICD-10-CM | POA: Insufficient documentation

## 2016-03-13 DIAGNOSIS — Z87442 Personal history of urinary calculi: Secondary | ICD-10-CM | POA: Diagnosis not present

## 2016-03-13 DIAGNOSIS — L409 Psoriasis, unspecified: Secondary | ICD-10-CM | POA: Diagnosis not present

## 2016-03-13 HISTORY — PX: ESOPHAGOGASTRODUODENOSCOPY (EGD) WITH PROPOFOL: SHX5813

## 2016-03-13 HISTORY — DX: Malignant (primary) neoplasm, unspecified: C80.1

## 2016-03-13 HISTORY — DX: Occlusion and stenosis of unspecified vertebral artery: I65.09

## 2016-03-13 HISTORY — PX: COLONOSCOPY WITH PROPOFOL: SHX5780

## 2016-03-13 LAB — CBC
HCT: 53.6 % — ABNORMAL HIGH (ref 40.0–52.0)
HEMOGLOBIN: 18.8 g/dL — AB (ref 13.0–18.0)
MCH: 32.3 pg (ref 26.0–34.0)
MCHC: 35 g/dL (ref 32.0–36.0)
MCV: 92.1 fL (ref 80.0–100.0)
Platelets: 101 10*3/uL — ABNORMAL LOW (ref 150–440)
RBC: 5.82 MIL/uL (ref 4.40–5.90)
RDW: 13.6 % (ref 11.5–14.5)
WBC: 5.3 10*3/uL (ref 3.8–10.6)

## 2016-03-13 SURGERY — COLONOSCOPY WITH PROPOFOL
Anesthesia: General

## 2016-03-13 MED ORDER — EPHEDRINE SULFATE 50 MG/ML IJ SOLN
INTRAMUSCULAR | Status: DC | PRN
  Administered 2016-03-13: 5 mg via INTRAVENOUS

## 2016-03-13 MED ORDER — SODIUM CHLORIDE 0.9 % IV SOLN: INTRAVENOUS | Status: DC

## 2016-03-13 MED ORDER — PROPOFOL 10 MG/ML IV BOLUS
INTRAVENOUS | Status: DC | PRN
  Administered 2016-03-13 (×2): 20 mg via INTRAVENOUS
  Administered 2016-03-13: 100 mg via INTRAVENOUS
  Administered 2016-03-13: 50 mg via INTRAVENOUS
  Administered 2016-03-13: 30 mg via INTRAVENOUS
  Administered 2016-03-13: 10 mg via INTRAVENOUS

## 2016-03-13 MED ORDER — PROPOFOL 500 MG/50ML IV EMUL
INTRAVENOUS | Status: DC | PRN
Start: 2016-03-13 — End: 2016-03-13
  Administered 2016-03-13: 160 ug/kg/min via INTRAVENOUS

## 2016-03-13 MED ORDER — FENTANYL CITRATE (PF) 100 MCG/2ML IJ SOLN
INTRAMUSCULAR | Status: DC | PRN
  Administered 2016-03-13: 50 ug via INTRAVENOUS

## 2016-03-13 MED ORDER — LIDOCAINE 2% (20 MG/ML) 5 ML SYRINGE
INTRAMUSCULAR | Status: DC | PRN
  Administered 2016-03-13: 40 mg via INTRAVENOUS

## 2016-03-13 MED ORDER — MIDAZOLAM HCL 5 MG/5ML IJ SOLN
INTRAMUSCULAR | Status: DC | PRN
  Administered 2016-03-13: 1 mg via INTRAVENOUS

## 2016-03-13 MED ORDER — SODIUM CHLORIDE 0.9 % IV SOLN
INTRAVENOUS | Status: DC
  Administered 2016-03-13: 1000 mL via INTRAVENOUS
  Administered 2016-03-13: 08:00:00 via INTRAVENOUS

## 2016-03-13 NOTE — Op Note (Signed)
[facility] Gastroenterology Patient Name: [name] Procedure Date: 03/13/2016 8:16 AM MRN: [id number] Account #: [id number] Date of Birth: [birth year] Admit Type: Outpatient Age: 82 Room: [facility] ENDO ROOM 3 Gender: Male Note Status: Finalized Procedure:            Colonoscopy Indications:          Personal history of colonic polyps Providers:            Lollie Sails, MD Referring MD:         Youlanda Roys. Lovie Macadamia, MD (Referring MD) Medicines:            Monitored Anesthesia Care Complications:        No immediate complications. Procedure:            Pre-Anesthesia Assessment:                       - ASA Grade Assessment: III - A patient with severe                        systemic disease.                       After obtaining informed consent, the colonoscope was                        passed under direct vision. Throughout the procedure,                        the patient's blood pressure, pulse, and oxygen                        saturations were monitored continuously. The                        Colonoscope was introduced through the anus and                        advanced to the the cecum, identified by appendiceal                        orifice and ileocecal valve. The colonoscopy was                        performed with moderate difficulty due to multiple                        diverticula in the colon and poor bowel prep with stool                        present. Successful completion of the procedure was                        aided by lavage. The quality of the bowel preparation                        was fair. Findings:      Multiple small and large-mouthed diverticula were found in the entire       colon.      A 2 mm polyp was found in the rectum. The polyp was sessile. The polyp  was removed with a cold biopsy forceps. Resection and retrieval were       complete.      No additional abnormalities were found on retroflexion.  Non-bleeding external and internal hemorrhoids were found during       retroflexion and during anoscopy. The hemorrhoids were medium-sized. Impression:           - Preparation of the colon was fair.                       - Diverticulosis in the entire examined colon.                       - One 2 mm polyp in the rectum, removed with a cold                        biopsy forceps. Resected and retrieved. Recommendation:       - Await pathology results.                       - Telephone GI clinic for pathology results in 1 week. Procedure Code(s):    --- Professional ---                       [phone], Colonoscopy, flexible; with biopsy, single or                        multiple Diagnosis Code(s):    --- Professional ---                       K62.1, Rectal polyp                       Z86.010, Personal history of colonic polyps                       K57.30, Diverticulosis of large intestine without                        perforation or abscess without bleeding CPT copyright 2016 American Medical Association. All rights reserved. The codes documented in this report are preliminary and upon coder review may  be revised to meet current compliance requirements. Lollie Sails, MD 03/13/2016 9:11:14 AM This report has been signed electronically. Number of Addenda: 0 Note Initiated On: 03/13/2016 8:16 AM Scope Withdrawal Time: 0 hours 8 minutes 57 seconds  Total Procedure Duration: 0 hours 21 minutes 4 seconds       [facility]

## 2016-03-13 NOTE — Transfer of Care (Signed)
Immediate Anesthesia Transfer of Care Note  Patient: [name]  Procedure(s) Performed: Procedure(s): COLONOSCOPY WITH PROPOFOL (N/A) ESOPHAGOGASTRODUODENOSCOPY (EGD) WITH PROPOFOL (N/A)  Patient Location: PACU and Endoscopy Unit  Anesthesia Type:General  Level of Consciousness: sedated  Airway & Oxygen Therapy: Patient Spontanous Breathing and Patient connected to nasal cannula oxygen  Post-op Assessment: Report given to RN and Post -op Vital signs reviewed and stable  Post vital signs: stable  Last Vitals:  Filed Vitals:   03/13/16 0724  BP: 123/75  Pulse: 67  Temp: 35.9 C  Resp: 16    Last Pain: There were no vitals filed for this visit.       Complications: No apparent anesthesia complications

## 2016-03-13 NOTE — Anesthesia Preprocedure Evaluation (Signed)
Anesthesia Evaluation  Patient identified by MRN, date of birth, ID band Patient awake    Reviewed: Allergy & Precautions, NPO status , Patient's Chart, lab work & pertinent test results  Airway Mallampati: II       Dental  (+) Teeth Intact   Pulmonary neg pulmonary ROS, former smoker,    breath sounds clear to auscultation       Cardiovascular Exercise Tolerance: Good hypertension,  Rhythm:Regular     Neuro/Psych  Headaches,    GI/Hepatic GERD  Medicated,  Endo/Other  negative endocrine ROS  Renal/GU      Musculoskeletal   Abdominal Normal abdominal exam  (+)   Peds negative pediatric ROS (+)  Hematology negative hematology ROS (+)   Anesthesia Other Findings   Reproductive/Obstetrics                             Anesthesia Physical Anesthesia Plan  ASA: II  Anesthesia Plan: General   Post-op Pain Management:    Induction: Intravenous  Airway Management Planned: Natural Airway and Nasal Cannula  Additional Equipment:   Intra-op Plan:   Post-operative Plan:   Informed Consent: I have reviewed the patients History and Physical, chart, labs and discussed the procedure including the risks, benefits and alternatives for the proposed anesthesia with the patient or authorized representative who has indicated his/her understanding and acceptance.     Plan Discussed with: CRNA  Anesthesia Plan Comments:         Anesthesia Quick Evaluation

## 2016-03-13 NOTE — Anesthesia Postprocedure Evaluation (Signed)
Anesthesia Post Note  Patient: [name]  Procedure(s) Performed: Procedure(s) (LRB): COLONOSCOPY WITH PROPOFOL (N/A) ESOPHAGOGASTRODUODENOSCOPY (EGD) WITH PROPOFOL (N/A)  Patient location during evaluation: PACU Anesthesia Type: General Level of consciousness: awake Pain management: satisfactory to patient Vital Signs Assessment: post-procedure vital signs reviewed and stable Respiratory status: nonlabored ventilation Cardiovascular status: blood pressure returned to baseline Anesthetic complications: no    Last Vitals:  Filed Vitals:   03/13/16 0910 03/13/16 0920  BP: 96/63 101/65  Pulse: 69 65  Temp: 35.9 C   Resp: 12 15    Last Pain: There were no vitals filed for this visit.               VAN STAVEREN,Kaleia Longhi

## 2016-03-13 NOTE — H&P (Signed)
Outpatient short stay form Pre-procedure 03/13/2016 8:13 AM Kenneth Sails MD  Primary Physician: Dr. [name]  Reason for visit:  EGD and colonoscopy  History of present illness:  Patient is a 82 year old Caucasian male presenting today for follow-up evaluation of his history of varices esophagus as well as personal history of adenomatous colon polyps. He tolerated his prep well. He has taken no recent aspirin products. He takes no blood thinning agents.    Current facility-administered medications:  .  0.9 %  sodium chloride infusion, , Intravenous, Continuous, Kenneth Sails, MD, Last Rate: 20 mL/hr at 03/13/16 0809, 1,000 mL at 03/13/16 0809 .  0.9 %  sodium chloride infusion, , Intravenous, Continuous, Kenneth Sails, MD  Prescriptions prior to admission  Medication Sig Dispense Refill Last Dose  . CALCIUM ASCORBATE PO Take 1 tablet by mouth daily.     . calcium-vitamin D (OSCAL WITH D) 500-200 MG-UNIT tablet Take 1 tablet by mouth daily with breakfast.     . Testosterone (ANDROGEL PUMP) 20.25 MG/ACT (1.62%) GEL 20.5 mg daily.    03/13/2016 at 0630  . Ascorbic Acid (VITAMIN C) 1000 MG tablet Take by mouth.   Taking  . Multiple Vitamin (MULTI-VITAMINS) TABS Take by mouth daily.    Taking  . omeprazole (PRILOSEC) 20 MG capsule Take 20 mg by mouth daily.    Taking  . tamsulosin (FLOMAX) 0.4 MG CAPS capsule Take 1 capsule (0.4 mg total) by mouth daily after supper. 30 capsule 11 Taking  . valACYclovir (VALTREX) 1000 MG tablet Take 500 mg by mouth daily.    Taking     Allergies  Allergen Reactions  . Other Diarrhea    Other reaction(s): Abdominal Pain tarragon     Past Medical History  Diagnosis Date  . Reflux   . Near syncope   . Subclavian steal syndrome   . Arrhythmia   . Syncope and collapse   . Thrombocytopenia ([place])   . Exotropia, left eye   . Allergic rhinitis   . Hemorrhoid   . Nephrolithiasis   . Migraine   . Barrett esophagus 08/12/12,  12/26/10, 06/21/08, 04/20/06, 04/11/03, 01/05/03  . Low testosterone   . Melanoma in situ ([place])     left ear, right cheek  . Cardiac arrhythmia   . H/O: CVA (cerebrovascular accident)   . Ventricular premature depolarization   . Essential hypertension   . Vertebral artery stenosis   . Splenomegaly   . Hepatic cyst   . Adenomatous polyp 2012  . Cataract   . Chicken pox   . Colon polyp 12/29/10, 04/18/06, 01/05/03  . Diplopia   . GERD (gastroesophageal reflux disease)   . Gout   . Psoriasis   . Strabismus   . Nephrolithiasis   . Occlusion and stenosis of vertebral artery   . Cancer ([place])     melanoma  . Stroke [facility])     Review of systems:      Physical Exam    Heart and lungs: Regular rate and rhythm without rub or gallop, lungs are bilaterally clear.    HEENT: Normocephalic atraumatic eyes are anicteric    Other:     Pertinant exam for procedure: Soft nontender nondistended bowel sounds positive normoactive    Planned proceedures: EGD, colonoscopy and indicated procedures. I have discussed the risks benefits and complications of procedures to include not limited to bleeding, infection, perforation and the risk of sedation and the patient wishes to proceed.    Hassell Done  Kassie Mends, MD Gastroenterology 03/13/2016  8:13 AM

## 2016-03-13 NOTE — Op Note (Signed)
[facility] Gastroenterology Patient Name: [name] Procedure Date: 03/13/2016 8:16 AM MRN: [id number] Account #: [id number] Date of Birth: 1933/12/07 Admit Type: Outpatient Age: 82 Room: [facility] ENDO ROOM 3 Gender: Male Note Status: Finalized Procedure:            Upper GI endoscopy Indications:          Follow-up of Barrett's esophagus Providers:            Lollie Sails, MD Referring MD:         Youlanda Roys. Lovie Macadamia, MD (Referring MD) Medicines:            Monitored Anesthesia Care Complications:        No immediate complications. Procedure:            Pre-Anesthesia Assessment:                       - ASA Grade Assessment: II - A patient with mild                        systemic disease.                       After obtaining informed consent, the endoscope was                        passed under direct vision. Throughout the procedure,                        the patient's blood pressure, pulse, and oxygen                        saturations were monitored continuously. The Endoscope                        was introduced through the mouth, and advanced to the                        third part of duodenum. The upper GI endoscopy was                        accomplished without difficulty. The patient tolerated                        the procedure well. Findings:      There were esophageal mucosal changes secondary to established       short-segment Barrett's disease present at the gastroesophageal       junction. The maximum longitudinal extent of these mucosal changes was 2       cm in length. Mucosa was biopsied with a cold forceps for histology in 4       quadrants.      A widely patent and non-obstructing, transient appearing Schatzki ring       (acquired) was found in the lower third of the esophagus.      Mild portal hypertensive gastropathy versus gastritis was found in the       gastric body. Biopsies were taken with a cold forceps for histology.     The exam of the esophagus was otherwise normal, no evidence of varices.      The examined duodenum was normal.      The cardia and  gastric fundus were normal on retroflexion. Impression:           - Esophageal mucosal changes secondary to established                        short-segment Barrett's disease. Biopsied.                       - Widely patent and non-obstructing Schatzki ring.                       - Portal hypertensive gastropathy. Biopsied.                       - Normal examined duodenum. Recommendation:       - Perform an abdominal ultrasound with dopplers of the                        portal splenic and hepatic veins and elastography at                        appointment to be scheduled.                       - Await pathology results. Procedure Code(s):    --- Professional ---                       [phone], Esophagogastroduodenoscopy, flexible, transoral;                        with biopsy, single or multiple Diagnosis Code(s):    --- Professional ---                       K22.70, Barrett's esophagus without dysplasia                       K22.2, Esophageal obstruction                       K76.6, Portal hypertension                       K31.89, Other diseases of stomach and duodenum CPT copyright 2016 American Medical Association. All rights reserved. The codes documented in this report are preliminary and upon coder review may  be revised to meet current compliance requirements. Lollie Sails, MD 03/13/2016 8:43:33 AM This report has been signed electronically. Number of Addenda: 0 Note Initiated On: 03/13/2016 8:16 AM      [facility]

## 2016-03-18 ENCOUNTER — Encounter: Payer: Self-pay | Admitting: Gastroenterology

## 2016-03-23 ENCOUNTER — Other Ambulatory Visit: Payer: Self-pay | Admitting: Gastroenterology

## 2016-03-23 DIAGNOSIS — I864 Gastric varices: Secondary | ICD-10-CM

## 2016-03-27 ENCOUNTER — Ambulatory Visit (INDEPENDENT_AMBULATORY_CARE_PROVIDER_SITE_OTHER): Payer: Medicare Other | Admitting: Urology

## 2016-03-27 ENCOUNTER — Encounter: Payer: Self-pay | Admitting: Urology

## 2016-03-27 ENCOUNTER — Ambulatory Visit: Payer: Medicare Other

## 2016-03-27 VITALS — BP 144/66 | HR 66 | Ht 70.0 in | Wt 164.2 lb

## 2016-03-27 DIAGNOSIS — R351 Nocturia: Secondary | ICD-10-CM

## 2016-03-27 DIAGNOSIS — R3912 Poor urinary stream: Secondary | ICD-10-CM | POA: Diagnosis not present

## 2016-03-27 LAB — SURGICAL PATHOLOGY

## 2016-03-27 NOTE — Progress Notes (Signed)
03/27/2016 8:55 AM   [name] [birth year] [id number]  Referring provider: [name], MD [phone] [place], [place] [address]  Chief Complaint  Patient presents with  . Follow-up    weak urinary stream, nocturia    HPI: [name] is a 82yo seen in followup for BPH with LUTS. He was started on flomax 6 weeks ago and notes improvement in stream. Nocturia has decreased to 1x. His hesitancy, frequency, urgency. Has improved.    PMH: Past Medical History:  Diagnosis Date  . Adenomatous polyp 2012  . Allergic rhinitis   . Arrhythmia   . 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 Cancer ([place])    melanoma  . Cardiac arrhythmia   . Cataract   . Chicken pox   . Colon polyp 12/29/10, 04/18/06, 01/05/03  . Diplopia   . Essential hypertension   . Exotropia, left eye   . GERD (gastroesophageal reflux disease)   . Gout   . H/O: CVA (cerebrovascular accident)   . Hemorrhoid   . Hepatic cyst   . Low testosterone   . Melanoma in situ ([place])    left ear, right cheek  . Migraine   . Near syncope   . Nephrolithiasis   . Nephrolithiasis   . Occlusion and stenosis of vertebral artery   . Psoriasis   . Reflux   . Splenomegaly   . Strabismus   . Stroke ([place])   . Subclavian steal syndrome   . Syncope and collapse   . Thrombocytopenia ([place])   . Ventricular premature depolarization   . Vertebral artery stenosis     Surgical History: Past Surgical History:  Procedure Laterality Date  . APPENDECTOMY    . COLONOSCOPY    . COLONOSCOPY WITH PROPOFOL N/A 03/13/2016   Procedure: COLONOSCOPY WITH PROPOFOL;  Surgeon: Lollie Sails, MD;  Location: [facility] ENDOSCOPY;  Service: Endoscopy;  Laterality: N/A;  . diplopia    . ESOPHAGOGASTRODUODENOSCOPY  08/2014, 06/14/2012  . ESOPHAGOGASTRODUODENOSCOPY (EGD) WITH PROPOFOL N/A 03/13/2016   Procedure: ESOPHAGOGASTRODUODENOSCOPY (EGD) WITH PROPOFOL;  Surgeon: Lollie Sails, MD;  Location: [facility]  ENDOSCOPY;  Service: Endoscopy;  Laterality: N/A;  . EYE SURGERY  08/16/2003   eye sug  . MOHS SURGERY  2011, 2012   left ear and right cheek  . STRABISMUS SURGERY  10/20/2015   2 horizontal muscles-left    Home Medications:    Medication List       Accurate as of 03/27/16  8:55 AM. Always use your most recent med list.          calcium-vitamin D 500-200 MG-UNIT tablet Commonly known as:  OSCAL WITH D Take 1 tablet by mouth daily with breakfast.   MULTI-VITAMINS Tabs Take by mouth daily.   omeprazole 20 MG capsule Commonly known as:  PRILOSEC TAKE 1 CAPSULE BY MOUTH ONCE DAILY 1 HOUR BEFORE A MEAL   polyethylene glycol powder powder Commonly known as:  GLYCOLAX/MIRALAX   tamsulosin 0.4 MG Caps capsule Commonly known as:  FLOMAX Take by mouth.   Testosterone 20.25 MG/ACT (1.62%) Gel Apply to shoulders and/or upper arms only 5 grams   valACYclovir 1000 MG tablet Commonly known as:  VALTREX Take 500 mg by mouth daily.       Allergies:  Allergies  Allergen Reactions  . Other Diarrhea    Other reaction(s): Abdominal Pain tarragon    Family History: Family History  Problem Relation Age of Onset  . Heart failure Mother   .  Heart attack Father   . Osteoporosis Mother   . Stroke Mother   . Gout Father   . Diabetes Mellitus II Father   . Renal Disease Father   . Alcohol abuse Father   . Osteoporosis Maternal Grandfather   . Osteoporosis Maternal Grandmother     Social History:  reports that he quit smoking about 55 years ago. His smoking use included Cigarettes. He has a 2.50 pack-year smoking history. He has never used smokeless tobacco. He reports that he drinks alcohol. He reports that he does not use drugs.  ROS: UROLOGY Frequent Urination?: No Hard to postpone urination?: No Burning/pain with urination?: No Get up at night to urinate?: No Leakage of urine?: No Urine stream starts and stops?: No Trouble starting stream?: Yes Do you have to  strain to urinate?: No Blood in urine?: No Urinary tract infection?: No Sexually transmitted disease?: No Injury to kidneys or bladder?: No Painful intercourse?: No Weak stream?: Yes Erection problems?: No Penile pain?: No  Gastrointestinal Nausea?: No Vomiting?: No Indigestion/heartburn?: No Diarrhea?: No Constipation?: No  Constitutional Fever: No Night sweats?: No Weight loss?: No Fatigue?: No  Skin Skin rash/lesions?: No Itching?: No  Eyes Blurred vision?: No Double vision?: No  Ears/Nose/Throat Sore throat?: No Sinus problems?: No  Hematologic/Lymphatic Swollen glands?: No Easy bruising?: No  Cardiovascular Leg swelling?: No Chest pain?: No  Respiratory Cough?: No Shortness of breath?: No  Endocrine Excessive thirst?: No  Musculoskeletal Back pain?: No Joint pain?: No  Neurological Headaches?: No Dizziness?: No  Psychologic Depression?: No Anxiety?: No  Physical Exam: BP (!) 144/66 (BP Location: Left Arm, Patient Position: Sitting)   Pulse 66   Ht 5\' 10"  (1.778 m)   Wt 74.5 kg (164 lb 3.2 oz)   BMI 23.56 kg/m   Constitutional:  Alert and oriented, No acute distress. HEENT: [place] AT, moist mucus membranes.  Trachea midline, no masses. Cardiovascular: No clubbing, cyanosis, or edema. Respiratory: Normal respiratory effort, no increased work of breathing. GI: Abdomen is soft, nontender, nondistended, no abdominal masses GU: No CVA tenderness.  Skin: No rashes, bruises or suspicious lesions. Lymph: No cervical or inguinal adenopathy. Neurologic: Grossly intact, no focal deficits, moving all 4 extremities. Psychiatric: Normal mood and affect.  Laboratory Data: Lab Results  Component Value Date   WBC 5.3 03/13/2016   HGB 18.8 (H) 03/13/2016   HCT 53.6 (H) 03/13/2016   MCV 92.1 03/13/2016   PLT 101 (L) 03/13/2016    Lab Results  Component Value Date   CREATININE 1.24 06/03/2014    No results found for: PSA  No results found  for: TESTOSTERONE  Lab Results  Component Value Date   HGBA1C 5.4 06/04/2014    Urinalysis    Component Value Date/Time   COLORURINE Yellow 06/03/2014 1536   APPEARANCEUR Clear 01/31/2016 1001   LABSPEC 1.013 06/03/2014 1536   PHURINE 5.0 06/03/2014 1536   GLUCOSEU Negative 01/31/2016 1001   GLUCOSEU Negative 06/03/2014 1536   HGBUR Negative 06/03/2014 1536   BILIRUBINUR Negative 01/31/2016 1001   BILIRUBINUR Negative 06/03/2014 1536   KETONESUR Negative 06/03/2014 1536   PROTEINUR Negative 01/31/2016 1001   PROTEINUR Negative 06/03/2014 1536   NITRITE Negative 01/31/2016 1001   NITRITE Negative 06/03/2014 1536   LEUKOCYTESUR Negative 01/31/2016 1001   LEUKOCYTESUR Negative 06/03/2014 1536    Pertinent Imaging: none  Assessment & Plan:   1. Nocturia -timed voiding - Bladder Scan (Post Void Residual) in office  2. Weak urinary stream -continue flomax 0.4mg   daily -RTC 3 months - Bladder Scan (Post Void Residual) in office -we discussed 5ARI treatment and the pt defers at this time   No Follow-up on file.  Nicolette Bang, MD  [facility] [address] [place], [place] [address] [phone]

## 2016-03-28 ENCOUNTER — Ambulatory Visit
Admission: RE | Admit: 2016-03-28 | Discharge: 2016-03-28 | Disposition: A | Discharge status: Home / Self Care | Payer: Medicare Other | Source: Ambulatory Visit | Attending: Gastroenterology | Admitting: Gastroenterology

## 2016-03-28 DIAGNOSIS — K8020 Calculus of gallbladder without cholecystitis without obstruction: Secondary | ICD-10-CM | POA: Insufficient documentation

## 2016-03-28 DIAGNOSIS — I864 Gastric varices: Secondary | ICD-10-CM

## 2016-03-28 DIAGNOSIS — R161 Splenomegaly, not elsewhere classified: Secondary | ICD-10-CM | POA: Diagnosis not present

## 2016-05-28 ENCOUNTER — Inpatient Hospital Stay: Payer: Medicare Other | Attending: Internal Medicine

## 2016-05-28 DIAGNOSIS — D696 Thrombocytopenia, unspecified: Secondary | ICD-10-CM | POA: Insufficient documentation

## 2016-05-28 LAB — LACTATE DEHYDROGENASE: LDH: 133 U/L (ref 98–192)

## 2016-05-28 LAB — COMPREHENSIVE METABOLIC PANEL
ALBUMIN: 4.7 g/dL (ref 3.5–5.0)
ALT: 16 U/L — AB (ref 17–63)
AST: 27 U/L (ref 15–41)
Alkaline Phosphatase: 65 U/L (ref 38–126)
Anion gap: 6 (ref 5–15)
BUN: 26 mg/dL — AB (ref 6–20)
CHLORIDE: 105 mmol/L (ref 101–111)
CO2: 27 mmol/L (ref 22–32)
CREATININE: 1.09 mg/dL (ref 0.61–1.24)
Calcium: 9.2 mg/dL (ref 8.9–10.3)
GFR calc Af Amer: >60 mL/min (ref >60)
GFR calc non Af Amer: >60 mL/min (ref >60)
GLUCOSE: 102 mg/dL — AB (ref 65–99)
Potassium: 4 mmol/L (ref 3.5–5.1)
SODIUM: 138 mmol/L (ref 135–145)
Total Bilirubin: 1.1 mg/dL (ref 0.3–1.2)
Total Protein: 7.4 g/dL (ref 6.5–8.1)

## 2016-05-28 LAB — APTT: aPTT: 29 seconds (ref 24–36)

## 2016-05-28 LAB — CBC WITH DIFFERENTIAL/PLATELET
BASOS ABS: 0 10*3/uL (ref 0–0.1)
BASOS PCT: 1 %
Eosinophils Absolute: 0.2 10*3/uL (ref 0–0.7)
Eosinophils Relative: 5 %
HEMATOCRIT: 52.6 % — AB (ref 40.0–52.0)
HEMOGLOBIN: 18.4 g/dL — AB (ref 13.0–18.0)
Lymphocytes Relative: 14 %
Lymphs Abs: 0.7 10*3/uL — ABNORMAL LOW (ref 1.0–3.6)
MCH: 31.8 pg (ref 26.0–34.0)
MCHC: 35 g/dL (ref 32.0–36.0)
MCV: 90.9 fL (ref 80.0–100.0)
Monocytes Absolute: 0.5 10*3/uL (ref 0.2–1.0)
Monocytes Relative: 10 %
NEUTROS ABS: 3.5 10*3/uL (ref 1.4–6.5)
NEUTROS PCT: 70 %
PLATELETS: 107 10*3/uL — AB (ref 150–440)
RBC: 5.79 MIL/uL (ref 4.40–5.90)
RDW: 13.3 % (ref 11.5–14.5)
WBC: 5 10*3/uL (ref 3.8–10.6)

## 2016-05-28 LAB — PROTIME-INR
INR: 0.98
Prothrombin Time: 13 seconds (ref 11.4–15.2)

## 2016-05-30 LAB — MULTIPLE MYELOMA PANEL, SERUM
ALBUMIN/GLOB SERPL: 1.9 — AB (ref 0.7–1.7)
Albumin SerPl Elph-Mcnc: 4.3 g/dL (ref 2.9–4.4)
Alpha 1: 0.2 g/dL (ref 0.0–0.4)
Alpha2 Glob SerPl Elph-Mcnc: 0.5 g/dL (ref 0.4–1.0)
B-Globulin SerPl Elph-Mcnc: 0.8 g/dL (ref 0.7–1.3)
GAMMA GLOB SERPL ELPH-MCNC: 0.8 g/dL (ref 0.4–1.8)
GLOBULIN, TOTAL: 2.3 g/dL (ref 2.2–3.9)
IGA: 67 mg/dL (ref 61–437)
IGM, SERUM: 67 mg/dL (ref 15–143)
IgG (Immunoglobin G), Serum: 854 mg/dL (ref 700–1600)
Total Protein ELP: 6.6 g/dL (ref 6.0–8.5)

## 2016-06-04 ENCOUNTER — Inpatient Hospital Stay: Payer: Medicare Other | Attending: Internal Medicine | Admitting: Internal Medicine

## 2016-06-04 ENCOUNTER — Encounter: Payer: Self-pay | Admitting: Internal Medicine

## 2016-06-04 DIAGNOSIS — Z86010 Personal history of colonic polyps: Secondary | ICD-10-CM | POA: Insufficient documentation

## 2016-06-04 DIAGNOSIS — I6509 Occlusion and stenosis of unspecified vertebral artery: Secondary | ICD-10-CM | POA: Insufficient documentation

## 2016-06-04 DIAGNOSIS — G458 Other transient cerebral ischemic attacks and related syndromes: Secondary | ICD-10-CM | POA: Insufficient documentation

## 2016-06-04 DIAGNOSIS — M129 Arthropathy, unspecified: Secondary | ICD-10-CM | POA: Diagnosis not present

## 2016-06-04 DIAGNOSIS — Z85828 Personal history of other malignant neoplasm of skin: Secondary | ICD-10-CM | POA: Diagnosis not present

## 2016-06-04 DIAGNOSIS — I10 Essential (primary) hypertension: Secondary | ICD-10-CM | POA: Diagnosis not present

## 2016-06-04 DIAGNOSIS — Z8673 Personal history of transient ischemic attack (TIA), and cerebral infarction without residual deficits: Secondary | ICD-10-CM | POA: Diagnosis not present

## 2016-06-04 DIAGNOSIS — K2270 Barrett's esophagus without dysplasia: Secondary | ICD-10-CM | POA: Insufficient documentation

## 2016-06-04 DIAGNOSIS — I499 Cardiac arrhythmia, unspecified: Secondary | ICD-10-CM | POA: Diagnosis not present

## 2016-06-04 DIAGNOSIS — Z79899 Other long term (current) drug therapy: Secondary | ICD-10-CM | POA: Diagnosis not present

## 2016-06-04 DIAGNOSIS — Z87891 Personal history of nicotine dependence: Secondary | ICD-10-CM | POA: Diagnosis not present

## 2016-06-04 DIAGNOSIS — R161 Splenomegaly, not elsewhere classified: Secondary | ICD-10-CM | POA: Insufficient documentation

## 2016-06-04 DIAGNOSIS — Z87442 Personal history of urinary calculi: Secondary | ICD-10-CM | POA: Insufficient documentation

## 2016-06-04 DIAGNOSIS — K7689 Other specified diseases of liver: Secondary | ICD-10-CM | POA: Insufficient documentation

## 2016-06-04 DIAGNOSIS — D696 Thrombocytopenia, unspecified: Secondary | ICD-10-CM | POA: Diagnosis not present

## 2016-06-04 DIAGNOSIS — K219 Gastro-esophageal reflux disease without esophagitis: Secondary | ICD-10-CM | POA: Diagnosis not present

## 2016-06-04 DIAGNOSIS — I493 Ventricular premature depolarization: Secondary | ICD-10-CM | POA: Insufficient documentation

## 2016-06-04 NOTE — Progress Notes (Signed)
[facility] NOTE  Patient Care Team: [name], MD as PCP - General (Family Medicine)  CHIEF COMPLAINTS/PURPOSE OF CONSULTATION:   # 2013- CHRONIC ISOLATED THROMBOCYTOPENIA- [id number]; May 2017- 115 [place]- 2015- MILD splenomegaly; liver Elastography- Dr.Skulskie-N/ no cirrhosis; hepatitis B/C- NEG; Monoclonal work up-NEG  # On testosterone supplementation  HISTORY OF PRESENTING ILLNESS:  [name] 82 y.o.  male here for follow-up because of his thrombocytopenia.   Patient denies any bleeding gums or bleeding nose. Denies easy bruising. He is not on any blood thinners. He does drink 4-5 cocktails each night. This is not any new/ for many years.   ROS: A complete 10 point review of system is done which is negative except mentioned above in history of present illness  MEDICAL HISTORY:  Past Medical History:  Diagnosis Date  . Adenomatous polyp 2012  . Allergic rhinitis   . Arrhythmia   . 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 Cancer ([place])    melanoma  . Cardiac arrhythmia   . Cataract   . Chicken pox   . Colon polyp 12/29/10, 04/18/06, 01/05/03  . Diplopia   . Essential hypertension   . Exotropia, left eye   . GERD (gastroesophageal reflux disease)   . Gout   . H/O: CVA (cerebrovascular accident)   . Hemorrhoid   . Hepatic cyst   . Low testosterone   . Melanoma in situ ([place])    left ear, right cheek  . Migraine   . Near syncope   . Nephrolithiasis   . Nephrolithiasis   . Occlusion and stenosis of vertebral artery   . Psoriasis   . Reflux   . Splenomegaly   . Strabismus   . Stroke ([place])   . Subclavian steal syndrome   . Syncope and collapse   . Thrombocytopenia ([place])   . Ventricular premature depolarization   . Vertebral artery stenosis     SURGICAL HISTORY: Past Surgical History:  Procedure Laterality Date  . APPENDECTOMY    . COLONOSCOPY    . COLONOSCOPY WITH PROPOFOL N/A 03/13/2016   Procedure: COLONOSCOPY WITH PROPOFOL;  Surgeon: Lollie Sails, MD;  Location[facility] ENDOSCOPY;  Service: Endoscopy;  Laterality: N/A;  . diplopia    . ESOPHAGOGASTRODUODENOSCOPY  08/2014, 06/14/2012  . ESOPHAGOGASTRODUODENOSCOPY (EGD) WITH PROPOFOL N/A 03/13/2016   Procedure: ESOPHAGOGASTRODUODENOSCOPY (EGD) WITH PROPOFOL;  Surgeon: Lollie Sails, MD;  Location: [facility] ENDOSCOPY;  Service: Endoscopy;  Laterality: N/A;  . EYE SURGERY  08/16/2003   eye sug  . MOHS SURGERY  2011, 2012   left ear and right cheek  . STRABISMUS SURGERY  10/20/2015   2 horizontal muscles-left    SOCIAL HISTORY: [place]; no smoking; 4-5 cocktails a week; retired from [employer].  Social History   Social History  . Marital status: Married    Spouse name: N/A  . Number of children: N/A  . Years of education: N/A   Occupational History  . Not on file.   Social History Main Topics  . Smoking status: Former Smoker    Packs/day: 0.25    Years: 10.00    Types: Cigarettes    Quit date: 01/30/1961  . Smokeless tobacco: Never Used  . Alcohol use 0.0 oz/week     Comment: social drinker  . Drug use: No  . Sexual activity: Not on file   Other Topics Concern  . Not on file   Social History Narrative  . No narrative on file  FAMILY HISTORY: Family History  Problem Relation Age of Onset  . Heart failure Mother   . Osteoporosis Mother   . Stroke Mother   . Heart attack Father   . Gout Father   . Diabetes Mellitus II Father   . Renal Disease Father   . Alcohol abuse Father   . Osteoporosis Maternal Grandfather   . Osteoporosis Maternal Grandmother     ALLERGIES:  is allergic to other.  MEDICATIONS:  Current Outpatient Prescriptions  Medication Sig Dispense Refill  . calcium-vitamin D (OSCAL WITH D) 500-200 MG-UNIT tablet Take 1 tablet by mouth daily with breakfast.    . Multiple Vitamin (MULTI-VITAMINS) TABS Take by mouth daily.     Marland Kitchen omeprazole (PRILOSEC) 20 MG capsule TAKE 1  CAPSULE BY MOUTH ONCE DAILY 1 HOUR BEFORE A MEAL    . tamsulosin (FLOMAX) 0.4 MG CAPS capsule Take by mouth.    . Testosterone 20.25 MG/ACT (1.62%) GEL Apply to shoulders and/or upper arms only 5 grams    . valACYclovir (VALTREX) 1000 MG tablet Take 500 mg by mouth daily.     . clobetasol ointment (TEMOVATE) 0.05 %     . desonide (DESOWEN) 0.05 % cream      No current facility-administered medications for this visit.       Marland Kitchen  PHYSICAL EXAMINATION:   Vitals:   06/04/16 1012  BP: (!) 159/82  Resp: 16  Temp: (!) 96.7 F (35.9 C)   Filed Weights   06/04/16 1012  Weight: 169 lb 6.4 oz (76.8 kg)    GENERAL: Well-nourished well-developed; Alert, no distress and comfortable.   Alone.  EYES: no pallor or icterus OROPHARYNX: no thrush or ulceration; good dentition  NECK: supple, no masses felt LYMPH:  no palpable lymphadenopathy in the cervical, axillary or inguinal regions LUNGS: clear to auscultation and  No wheeze or crackles HEART/CVS: regular rate & rhythm and no murmurs; No lower extremity edema ABDOMEN: abdomen soft, non-tender and normal bowel sounds Musculoskeletal:no cyanosis of digits and no clubbing  PSYCH: alert & oriented x 3 with fluent speech NEURO: no focal motor/sensory deficits SKIN:  no rashes or significant lesions  LABORATORY DATA:  I have reviewed the data as listed Lab Results  Component Value Date   WBC 5.0 05/28/2016   HGB 18.4 (H) 05/28/2016   HCT 52.6 (H) 05/28/2016   MCV 90.9 05/28/2016   PLT 107 (L) 05/28/2016    Recent Labs  05/28/16 0943  NA 138  K 4.0  CL 105  CO2 27  GLUCOSE 102*  BUN 26*  CREATININE 1.09  CALCIUM 9.2  GFRNONAA >60  GFRAA >60  PROT 7.4  ALBUMIN 4.7  AST 27  ALT 16*  ALKPHOS 65  BILITOT 1.1     ASSESSMENT & PLAN:   Thrombocytopenia (HCC) # Chronic thrombocytopenia isolated- > 100 [since 2015]- ? Alcohol use; possibly from mild splenomegaly ITP versus primary bone marrow process like myelodysplastic  syndrome  [less likley]  Patient is fairly asymptomatic at this time; he is not on any blood thinners. Discussed re: bone marrow; wants to hold off for now; which is reasonable.   # follow up in 6 months/labs.     Cammie Sickle, MD 06/04/2016 12:58 PM

## 2016-06-04 NOTE — Progress Notes (Signed)
Pt reports Recent vertigo, some numbness on bottom of feet, chronic low back when moving.

## 2016-06-04 NOTE — Assessment & Plan Note (Addendum)
#   Chronic thrombocytopenia isolated- > 100 [since 2015]- ? Alcohol use; possibly from mild splenomegaly ITP versus primary bone marrow process like myelodysplastic syndrome  [less likley]  Patient is fairly asymptomatic at this time; he is not on any blood thinners. Discussed re: bone marrow; wants to hold off for now; which is reasonable.   # follow up in 6 months/labs.

## 2016-06-05 ENCOUNTER — Other Ambulatory Visit: Payer: Self-pay

## 2016-06-05 DIAGNOSIS — D696 Thrombocytopenia, unspecified: Secondary | ICD-10-CM

## 2016-06-26 ENCOUNTER — Encounter: Payer: Self-pay | Admitting: Urology

## 2016-06-26 ENCOUNTER — Ambulatory Visit (INDEPENDENT_AMBULATORY_CARE_PROVIDER_SITE_OTHER): Payer: Medicare Other | Admitting: Urology

## 2016-06-26 VITALS — BP 152/77 | HR 80 | Ht 70.0 in | Wt 167.0 lb

## 2016-06-26 DIAGNOSIS — R3912 Poor urinary stream: Secondary | ICD-10-CM

## 2016-06-26 DIAGNOSIS — N401 Enlarged prostate with lower urinary tract symptoms: Secondary | ICD-10-CM | POA: Insufficient documentation

## 2016-06-26 DIAGNOSIS — R351 Nocturia: Secondary | ICD-10-CM

## 2016-06-26 LAB — BLADDER SCAN AMB NON-IMAGING

## 2016-06-26 MED ORDER — FINASTERIDE 5 MG PO TABS: 5.0000 mg | ORAL_TABLET | Freq: Every day | ORAL | 3 refills | Status: DC

## 2016-06-26 NOTE — Progress Notes (Signed)
06/26/2016 9:40 AM   [name] [birth year] [id number]  Referring provider: [name], MD [phone] [place], [place] [address]  Chief Complaint  Patient presents with  . Follow-up    68month    HPI: [name] is a 82yo seen in followup for BPH with LUTS. He was started on flomax 6 weeks ago and notes improvement in stream. Nocturia has decreased to 1x. His hesitancy, frequency, urgency has improved.  Interval hx:  IPSS is 9 with a bother of 3. Weak stream was a 5 and his biggest complaint. He is still on flomax.    PMH: Past Medical History:  Diagnosis Date  . Adenomatous polyp 2012  . Allergic rhinitis   . Arrhythmia   . 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 Cancer ([place])    melanoma  . Cardiac arrhythmia   . Cataract   . Chicken pox   . Colon polyp 12/29/10, 04/18/06, 01/05/03  . Diplopia   . Essential hypertension   . Exotropia, left eye   . GERD (gastroesophageal reflux disease)   . Gout   . H/O: CVA (cerebrovascular accident)   . Hemorrhoid   . Hepatic cyst   . Low testosterone   . Melanoma in situ ([place])    left ear, right cheek  . Migraine   . Near syncope   . Nephrolithiasis   . Nephrolithiasis   . Occlusion and stenosis of vertebral artery   . Psoriasis   . Reflux   . Splenomegaly   . Strabismus   . Stroke ([place])   . Subclavian steal syndrome   . Syncope and collapse   . Thrombocytopenia ([place])   . Ventricular premature depolarization   . Vertebral artery stenosis     Surgical History: Past Surgical History:  Procedure Laterality Date  . APPENDECTOMY    . COLONOSCOPY    . COLONOSCOPY WITH PROPOFOL N/A 03/13/2016   Procedure: COLONOSCOPY WITH PROPOFOL;  Surgeon: Kenneth Sails, MD;  Location: [facility] ENDOSCOPY;  Service: Endoscopy;  Laterality: N/A;  . diplopia    . ESOPHAGOGASTRODUODENOSCOPY  08/2014, 06/14/2012  . ESOPHAGOGASTRODUODENOSCOPY (EGD) WITH PROPOFOL N/A 03/13/2016   Procedure:  ESOPHAGOGASTRODUODENOSCOPY (EGD) WITH PROPOFOL;  Surgeon: Kenneth Sails, MD;  Location: [facility] ENDOSCOPY;  Service: Endoscopy;  Laterality: N/A;  . EYE SURGERY  08/16/2003   eye sug  . MOHS SURGERY  2011, 2012   left ear and right cheek  . STRABISMUS SURGERY  10/20/2015   2 horizontal muscles-left    Home Medications:    Medication List       Accurate as of 06/26/16  9:40 AM. Always use your most recent med list.          calcium-vitamin D 500-200 MG-UNIT tablet Commonly known as:  OSCAL WITH D Take 1 tablet by mouth daily with breakfast.   desonide 0.05 % cream Commonly known as:  DESOWEN   MULTI-VITAMINS Tabs Take by mouth daily.   omeprazole 20 MG capsule Commonly known as:  PRILOSEC TAKE 1 CAPSULE BY MOUTH ONCE DAILY 1 HOUR BEFORE A MEAL   tamsulosin 0.4 MG Caps capsule Commonly known as:  FLOMAX Take by mouth.   Testosterone 20.25 MG/ACT (1.62%) Gel Apply to shoulders and/or upper arms only 5 grams   valACYclovir 1000 MG tablet Commonly known as:  VALTREX Take 500 mg by mouth daily.       Allergies:  Allergies  Allergen Reactions  . Other Diarrhea  Other reaction(s): Abdominal Pain tarragon    Family History: Family History  Problem Relation Age of Onset  . Heart failure Mother   . Osteoporosis Mother   . Stroke Mother   . Heart attack Father   . Gout Father   . Diabetes Mellitus II Father   . Renal Disease Father   . Alcohol abuse Father   . Osteoporosis Maternal Grandfather   . Osteoporosis Maternal Grandmother     Social History:  reports that he quit smoking about 55 years ago. His smoking use included Cigarettes. He has a 2.50 pack-year smoking history. He has never used smokeless tobacco. He reports that he drinks alcohol. He reports that he does not use drugs.  ROS: UROLOGY Frequent Urination?: No Hard to postpone urination?: No Burning/pain with urination?: No Get up at night to urinate?: Yes Leakage of urine?: No Urine  stream starts and stops?: No Trouble starting stream?: No Do you have to strain to urinate?: No Blood in urine?: No Urinary tract infection?: No Sexually transmitted disease?: No Injury to kidneys or bladder?: No Painful intercourse?: No Weak stream?: No Erection problems?: No Penile pain?: No  Gastrointestinal Nausea?: No Vomiting?: No Indigestion/heartburn?: No Diarrhea?: No Constipation?: No  Constitutional Fever: No Night sweats?: No Weight loss?: No Fatigue?: No  Skin Skin rash/lesions?: No Itching?: No  Eyes Blurred vision?: No Double vision?: No  Ears/Nose/Throat Sore throat?: No Sinus problems?: No  Hematologic/Lymphatic Swollen glands?: No Easy bruising?: No  Cardiovascular Leg swelling?: No Chest pain?: No  Respiratory Cough?: No Shortness of breath?: No  Endocrine Excessive thirst?: No  Musculoskeletal Back pain?: No Joint pain?: No  Neurological Headaches?: No Dizziness?: No  Psychologic Depression?: No Anxiety?: No  Physical Exam: BP (!) 152/77   Pulse 80   Ht 5\' 10"  (1.778 m)   Wt 75.8 kg (167 lb)   BMI 23.96 kg/m   Constitutional:  Alert and oriented, No acute distress. HEENT: [name] AT, moist mucus membranes.  Trachea midline, no masses. Cardiovascular: No clubbing, cyanosis, or edema. Respiratory: Normal respiratory effort, no increased work of breathing. GI: Abdomen is soft, nontender, nondistended, no abdominal masses GU: No CVA tenderness.  Skin: No rashes, bruises or suspicious lesions. Lymph: No cervical or inguinal adenopathy. Neurologic: Grossly intact, no focal deficits, moving all 4 extremities. Psychiatric: Normal mood and affect.  Laboratory Data: Lab Results  Component Value Date   WBC 5.0 05/28/2016   HGB 18.4 (H) 05/28/2016   HCT 52.6 (H) 05/28/2016   MCV 90.9 05/28/2016   PLT 107 (L) 05/28/2016    Lab Results  Component Value Date   CREATININE 1.09 05/28/2016    No results found for: PSA  No  results found for: TESTOSTERONE  Lab Results  Component Value Date   HGBA1C 5.4 06/04/2014    Urinalysis    Component Value Date/Time   COLORURINE Yellow 06/03/2014 1536   APPEARANCEUR Clear 01/31/2016 1001   LABSPEC 1.013 06/03/2014 1536   PHURINE 5.0 06/03/2014 1536   GLUCOSEU Negative 01/31/2016 1001   GLUCOSEU Negative 06/03/2014 1536   HGBUR Negative 06/03/2014 1536   BILIRUBINUR Negative 01/31/2016 1001   BILIRUBINUR Negative 06/03/2014 1536   KETONESUR Negative 06/03/2014 1536   PROTEINUR Negative 01/31/2016 1001   PROTEINUR Negative 06/03/2014 1536   NITRITE Negative 01/31/2016 1001   NITRITE Negative 06/03/2014 1536   LEUKOCYTESUR Negative 01/31/2016 1001   LEUKOCYTESUR Negative 06/03/2014 1536    Pertinent Imaging: none  Assessment & Plan:    1. Weak  urinary stream -flomax 0.4mg  -finasteride 5mg  - Bladder Scan (Post Void Residual) in office   No Follow-up on file.  Nicolette Bang, MD  [facility] [address], [place], [place] [address] [phone]

## 2016-11-05 ENCOUNTER — Encounter: Payer: Self-pay | Admitting: Cardiovascular Disease

## 2016-11-05 ENCOUNTER — Ambulatory Visit (INDEPENDENT_AMBULATORY_CARE_PROVIDER_SITE_OTHER): Payer: Medicare Other | Admitting: Cardiovascular Disease

## 2016-11-05 VITALS — BP 138/64 | HR 76 | Ht 70.0 in | Wt 171.0 lb

## 2016-11-05 DIAGNOSIS — R42 Dizziness and giddiness: Secondary | ICD-10-CM

## 2016-11-05 DIAGNOSIS — I6501 Occlusion and stenosis of right vertebral artery: Secondary | ICD-10-CM

## 2016-11-05 DIAGNOSIS — I498 Other specified cardiac arrhythmias: Secondary | ICD-10-CM | POA: Diagnosis not present

## 2016-11-05 DIAGNOSIS — R55 Syncope and collapse: Secondary | ICD-10-CM | POA: Diagnosis not present

## 2016-11-05 MED ORDER — MECLIZINE HCL 25 MG PO TABS: 25.0000 mg | ORAL_TABLET | Freq: Three times a day (TID) | ORAL | 3 refills | Status: DC | PRN
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
## 2016-11-05 NOTE — Patient Instructions (Addendum)
Medication Instructions:   No medication changes made  Try meclizine as needed for vertigo  Labwork:  No new labs needed  Testing/Procedures:  No further testing at this time   I recommend watching educational videos on topics of interest to you at:       www.goemmi.com  Enter code: HEARTCARE    Follow-Up: It was a pleasure seeing you in the office today. Please call us if you have new issues that need to be addressed before your next appt.  [phone]  Your physician wants you to follow-up in: 12 months.  You will receive a reminder letter in the mail two months in advance. If you don't receive a letter, please call our office to schedule the follow-up appointment.  If you need a refill on your cardiac medications before your next appointment, please call your pharmacy.

## 2016-12-03 ENCOUNTER — Inpatient Hospital Stay: Payer: Medicare Other | Attending: Internal Medicine

## 2016-12-03 ENCOUNTER — Inpatient Hospital Stay (HOSPITAL_BASED_OUTPATIENT_CLINIC_OR_DEPARTMENT_OTHER): Payer: Medicare Other | Admitting: Internal Medicine

## 2016-12-03 VITALS — BP 150/80 | HR 73 | Temp 97.8°F | Ht 71.0 in | Wt 170.9 lb

## 2016-12-03 DIAGNOSIS — M109 Gout, unspecified: Secondary | ICD-10-CM | POA: Diagnosis not present

## 2016-12-03 DIAGNOSIS — R161 Splenomegaly, not elsewhere classified: Secondary | ICD-10-CM | POA: Diagnosis not present

## 2016-12-03 DIAGNOSIS — G458 Other transient cerebral ischemic attacks and related syndromes: Secondary | ICD-10-CM | POA: Diagnosis not present

## 2016-12-03 DIAGNOSIS — I6509 Occlusion and stenosis of unspecified vertebral artery: Secondary | ICD-10-CM | POA: Insufficient documentation

## 2016-12-03 DIAGNOSIS — Z8673 Personal history of transient ischemic attack (TIA), and cerebral infarction without residual deficits: Secondary | ICD-10-CM | POA: Insufficient documentation

## 2016-12-03 DIAGNOSIS — Z87442 Personal history of urinary calculi: Secondary | ICD-10-CM

## 2016-12-03 DIAGNOSIS — Z8719 Personal history of other diseases of the digestive system: Secondary | ICD-10-CM | POA: Insufficient documentation

## 2016-12-03 DIAGNOSIS — D696 Thrombocytopenia, unspecified: Secondary | ICD-10-CM | POA: Diagnosis not present

## 2016-12-03 DIAGNOSIS — K7689 Other specified diseases of liver: Secondary | ICD-10-CM | POA: Diagnosis not present

## 2016-12-03 DIAGNOSIS — Z87891 Personal history of nicotine dependence: Secondary | ICD-10-CM | POA: Insufficient documentation

## 2016-12-03 DIAGNOSIS — K219 Gastro-esophageal reflux disease without esophagitis: Secondary | ICD-10-CM | POA: Insufficient documentation

## 2016-12-03 DIAGNOSIS — Z86010 Personal history of colonic polyps: Secondary | ICD-10-CM | POA: Insufficient documentation

## 2016-12-03 DIAGNOSIS — I493 Ventricular premature depolarization: Secondary | ICD-10-CM | POA: Insufficient documentation

## 2016-12-03 DIAGNOSIS — Z85828 Personal history of other malignant neoplasm of skin: Secondary | ICD-10-CM

## 2016-12-03 DIAGNOSIS — Z79890 Hormone replacement therapy (postmenopausal): Secondary | ICD-10-CM | POA: Insufficient documentation

## 2016-12-03 DIAGNOSIS — I10 Essential (primary) hypertension: Secondary | ICD-10-CM | POA: Diagnosis not present

## 2016-12-03 DIAGNOSIS — Z79899 Other long term (current) drug therapy: Secondary | ICD-10-CM

## 2016-12-03 LAB — CBC WITH DIFFERENTIAL/PLATELET
BASOS ABS: 0 10*3/uL (ref 0–0.1)
BASOS PCT: 1 %
Eosinophils Absolute: 0.2 10*3/uL (ref 0–0.7)
Eosinophils Relative: 3 %
HEMATOCRIT: 49.9 % (ref 40.0–52.0)
Hemoglobin: 17.3 g/dL (ref 13.0–18.0)
Lymphocytes Relative: 15 %
Lymphs Abs: 0.8 10*3/uL — ABNORMAL LOW (ref 1.0–3.6)
MCH: 32 pg (ref 26.0–34.0)
MCHC: 34.8 g/dL (ref 32.0–36.0)
MCV: 91.9 fL (ref 80.0–100.0)
MONO ABS: 0.5 10*3/uL (ref 0.2–1.0)
Monocytes Relative: 9 %
NEUTROS PCT: 72 %
Neutro Abs: 3.8 10*3/uL (ref 1.4–6.5)
Platelets: 107 10*3/uL — ABNORMAL LOW (ref 150–440)
RBC: 5.42 MIL/uL (ref 4.40–5.90)
RDW: 13.5 % (ref 11.5–14.5)
WBC: 5.3 10*3/uL (ref 3.8–10.6)

## 2016-12-03 LAB — COMPREHENSIVE METABOLIC PANEL
ALK PHOS: 63 U/L (ref 38–126)
ALT: 18 U/L (ref 17–63)
AST: 24 U/L (ref 15–41)
Albumin: 4.3 g/dL (ref 3.5–5.0)
Anion gap: 6 (ref 5–15)
BILIRUBIN TOTAL: 1 mg/dL (ref 0.3–1.2)
BUN: 22 mg/dL — ABNORMAL HIGH (ref 6–20)
CALCIUM: 9 mg/dL (ref 8.9–10.3)
CO2: 30 mmol/L (ref 22–32)
CREATININE: 1.14 mg/dL (ref 0.61–1.24)
Chloride: 103 mmol/L (ref 101–111)
GFR calc non Af Amer: 59 mL/min — ABNORMAL LOW (ref >60)
Glucose, Bld: 100 mg/dL — ABNORMAL HIGH (ref 65–99)
Potassium: 4.2 mmol/L (ref 3.5–5.1)
Sodium: 139 mmol/L (ref 135–145)
Total Protein: 7 g/dL (ref 6.5–8.1)

## 2016-12-03 NOTE — Assessment & Plan Note (Signed)
#   Chronic thrombocytopenia isolated- > 100 [since 2015]- ? Alcohol use; possibly from mild splenomegaly ITP versus primary bone marrow process like myelodysplastic syndrome  [less likley] . Today platelets 107; monitor for now.   # Patient is fairly asymptomatic at this time; he is not on any blood thinners. Again discussed re: bone marrow; wants to hold off for now; which is reasonable.   # follow up in 6 months/labs.

## 2016-12-03 NOTE — Progress Notes (Signed)
[place] NOTE  Patient Care Team: [name], MD as PCP - General (Family Medicine) Minna Merritts, MD as Consulting Physician (Cardiology)  CHIEF COMPLAINTS/PURPOSE OF CONSULTATION:   # 2013- CHRONIC ISOLATED THROMBOCYTOPENIA- 130-99; May 2017- 115 [place]- 2015- MILD splenomegaly; liver Elastography- Dr.Skulskie-N/ no cirrhosis; hepatitis B/C- NEG; Monoclonal work up-NEG  # On testosterone supplementation  HISTORY OF PRESENTING ILLNESS:  [name] 83 y.o.  male here for follow-up because of his thrombocytopenia.   Patient denies any bleeding gums or bleeding nose. Denies easy bruising. He is not on any blood thinners. He does drink 4-5 cocktails each night; denies any change in his drinking habits.  ROS: A complete 10 point review of system is done which is negative except mentioned above in history of present illness  MEDICAL HISTORY:  Past Medical History:  Diagnosis Date  . Adenomatous polyp 2012  . Allergic rhinitis   . Arrhythmia   . 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 Cancer ([place])    melanoma  . Cardiac arrhythmia   . Cataract   . Chicken pox   . Colon polyp 12/29/10, 04/18/06, 01/05/03  . Diplopia   . Essential hypertension   . Exotropia, left eye   . GERD (gastroesophageal reflux disease)   . Gout   . H/O: CVA (cerebrovascular accident)   . Hemorrhoid   . Hepatic cyst   . Low testosterone   . Melanoma in situ ([place])    left ear, right cheek  . Migraine   . Near syncope   . Nephrolithiasis   . Nephrolithiasis   . Occlusion and stenosis of vertebral artery   . Psoriasis   . Reflux   . Splenomegaly   . Strabismus   . Stroke ([place])   . Subclavian steal syndrome   . Syncope and collapse   . Thrombocytopenia ([place])   . Ventricular premature depolarization   . Vertebral artery stenosis     SURGICAL HISTORY: Past Surgical History:  Procedure Laterality Date  . APPENDECTOMY    .  COLONOSCOPY    . COLONOSCOPY WITH PROPOFOL N/A 03/13/2016   Procedure: COLONOSCOPY WITH PROPOFOL;  Surgeon: Lollie Sails, MD;  Location: [facility] ENDOSCOPY;  Service: Endoscopy;  Laterality: N/A;  . diplopia    . ESOPHAGOGASTRODUODENOSCOPY  08/2014, 06/14/2012  . ESOPHAGOGASTRODUODENOSCOPY (EGD) WITH PROPOFOL N/A 03/13/2016   Procedure: ESOPHAGOGASTRODUODENOSCOPY (EGD) WITH PROPOFOL;  Surgeon: Lollie Sails, MD;  Location: [facility] ENDOSCOPY;  Service: Endoscopy;  Laterality: N/A;  . EYE SURGERY  08/16/2003   eye sug  . MOHS SURGERY  2011, 2012   left ear and right cheek  . STRABISMUS SURGERY  10/20/2015   2 horizontal muscles-left    SOCIAL HISTORY: [place]; no smoking; 4-5 cocktails a week; retired from [employer].  Social History   Social History  . Marital status: Married    Spouse name: N/A  . Number of children: N/A  . Years of education: N/A   Occupational History  . Not on file.   Social History Main Topics  . Smoking status: Former Smoker    Packs/day: 0.25    Years: 10.00    Types: Cigarettes    Quit date: 01/30/1961  . Smokeless tobacco: Never Used  . Alcohol use 0.0 oz/week     Comment: social drinker  . Drug use: No  . Sexual activity: Not on file   Other Topics Concern  . Not on file   Social History  Narrative  . No narrative on file    FAMILY HISTORY: Family History  Problem Relation Age of Onset  . Heart failure Mother   . Osteoporosis Mother   . Stroke Mother   . Heart attack Father   . Gout Father   . Diabetes Mellitus II Father   . Renal Disease Father   . Alcohol abuse Father   . Osteoporosis Maternal Grandfather   . Osteoporosis Maternal Grandmother     ALLERGIES:  is allergic to other.  MEDICATIONS:  Current Outpatient Prescriptions  Medication Sig Dispense Refill  . calcium-vitamin D (OSCAL WITH D) 500-200 MG-UNIT tablet Take 1 tablet by mouth daily with breakfast.    . desonide (DESOWEN) 0.05 % cream     . finasteride  (PROSCAR) 5 MG tablet Take 1 tablet (5 mg total) by mouth daily. 90 tablet 3  . meclizine (ANTIVERT) 25 MG tablet Take 1 tablet (25 mg total) by mouth 3 (three) times daily as needed. 60 tablet 3  . Multiple Vitamin (MULTI-VITAMINS) TABS Take by mouth daily.     Marland Kitchen omeprazole (PRILOSEC) 20 MG capsule TAKE 1 CAPSULE BY MOUTH ONCE DAILY 1 HOUR BEFORE A MEAL    . tamsulosin (FLOMAX) 0.4 MG CAPS capsule Take by mouth.    . Testosterone 20.25 MG/ACT (1.62%) GEL Apply to shoulders and/or upper arms only 5 grams    . valACYclovir (VALTREX) 1000 MG tablet Take 500 mg by mouth daily.      No current facility-administered medications for this visit.       Marland Kitchen  PHYSICAL EXAMINATION:   Vitals:   12/03/16 1023  BP: (!) 150/80  Pulse: 73  Temp: 97.8 F (36.6 C)   Filed Weights   12/03/16 1023  Weight: 170 lb 14.4 oz (77.5 kg)    GENERAL: Well-nourished well-developed; Alert, no distress and comfortable.   Alone. Patient looks much younger than his stated age. EYES: no pallor or icterus OROPHARYNX: no thrush or ulceration; good dentition  NECK: supple, no masses felt LYMPH:  no palpable lymphadenopathy in the cervical, axillary or inguinal regions LUNGS: clear to auscultation and  No wheeze or crackles HEART/CVS: regular rate & rhythm and no murmurs; No lower extremity edema ABDOMEN: abdomen soft, non-tender and normal bowel sounds Musculoskeletal:no cyanosis of digits and no clubbing  PSYCH: alert & oriented x 3 with fluent speech NEURO: no focal motor/sensory deficits SKIN:  no rashes or significant lesions  LABORATORY DATA:  I have reviewed the data as listed Lab Results  Component Value Date   WBC 5.3 12/03/2016   HGB 17.3 12/03/2016   HCT 49.9 12/03/2016   MCV 91.9 12/03/2016   PLT 107 (L) 12/03/2016    Recent Labs  05/28/16 0943 12/03/16 1005  NA 138 139  K 4.0 4.2  CL 105 103  CO2 27 30  GLUCOSE 102* 100*  BUN 26* 22*  CREATININE 1.09 1.14  CALCIUM 9.2 9.0   GFRNONAA >60 59*  GFRAA >60 >60  PROT 7.4 7.0  ALBUMIN 4.7 4.3  AST 27 24  ALT 16* 18  ALKPHOS 65 63  BILITOT 1.1 1.0     ASSESSMENT & PLAN:   Thrombocytopenia (HCC) # Chronic thrombocytopenia isolated- > 100 [since 2015]- ? Alcohol use; possibly from mild splenomegaly ITP versus primary bone marrow process like myelodysplastic syndrome  [less likley] . Today platelets 107; monitor for now.   # Patient is fairly asymptomatic at this time; he is not on any blood thinners. Again  discussed re: bone marrow; wants to hold off for now; which is reasonable.   # follow up in 6 months/labs.     Cammie Sickle, MD 12/04/2016 1:28 PM

## 2016-12-03 NOTE — Progress Notes (Signed)
Patient here for follow up. He has injured his shoulder recently and has chronic back pain, otherwise no changes since last appointment.

## 2016-12-24 ENCOUNTER — Ambulatory Visit (INDEPENDENT_AMBULATORY_CARE_PROVIDER_SITE_OTHER): Payer: Medicare Other | Admitting: Urology

## 2016-12-24 ENCOUNTER — Encounter: Payer: Self-pay | Admitting: Urology

## 2016-12-24 VITALS — BP 131/76 | HR 73 | Ht 71.0 in | Wt 170.6 lb

## 2016-12-24 DIAGNOSIS — N401 Enlarged prostate with lower urinary tract symptoms: Secondary | ICD-10-CM | POA: Diagnosis not present

## 2016-12-24 DIAGNOSIS — R3912 Poor urinary stream: Secondary | ICD-10-CM

## 2016-12-24 LAB — BLADDER SCAN AMB NON-IMAGING: Scan Result: 19

## 2016-12-24 NOTE — Progress Notes (Signed)
12/24/2016 9:34 AM   [name] 12/10/1933 [id number]  Referring provider: [name], MD [id number] [place], [place] [address]  Chief Complaint  Patient presents with  . Follow-up    weak stream    HPI: [name] is a 83yo seen in followup for BPH with LUTS. He was started on flomax in May 2017 and noted improvement in stream. PVR was 26 mL. Nocturia decreased to 1x. His hesitancy, frequency, urgency improved. F/u IPSS was 9 with a bother of 3. Weak stream was a 5 and his biggest complaint. He started finasteride Oct 2017.   He is on androgel and per patient his PSA was 0.69 in 01/2016.  Today, [name] is seen for the above.  He voiding well with less frequency and urgency. Still sometimes a weak stream. Not a lot of change since finasteride. He continues tamsulosin. He's had no gross hematuria.    PMH: Past Medical History:  Diagnosis Date  . Adenomatous polyp 2012  . Allergic rhinitis   . Arrhythmia   . 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 Cancer ([place])    melanoma  . Cardiac arrhythmia   . Cataract   . Chicken pox   . Colon polyp 12/29/10, 04/18/06, 01/05/03  . Diplopia   . Essential hypertension   . Exotropia, left eye   . GERD (gastroesophageal reflux disease)   . Gout   . H/O: CVA (cerebrovascular accident)   . Hemorrhoid   . Hepatic cyst   . Low testosterone   . Melanoma in situ ([place])    left ear, right cheek  . Migraine   . Near syncope   . Nephrolithiasis   . Nephrolithiasis   . Occlusion and stenosis of vertebral artery   . Psoriasis   . Reflux   . Splenomegaly   . Strabismus   . Stroke ([place])   . Subclavian steal syndrome   . Syncope and collapse   . Thrombocytopenia ([place])   . Ventricular premature depolarization   . Vertebral artery stenosis     Surgical History: Past Surgical History:  Procedure Laterality Date  . APPENDECTOMY    . COLONOSCOPY    . COLONOSCOPY WITH PROPOFOL N/A  03/13/2016   Procedure: COLONOSCOPY WITH PROPOFOL;  Surgeon: Lollie Sails, MD;  Location: [facility] ENDOSCOPY;  Service: Endoscopy;  Laterality: N/A;  . diplopia    . ESOPHAGOGASTRODUODENOSCOPY  08/2014, 06/14/2012  . ESOPHAGOGASTRODUODENOSCOPY (EGD) WITH PROPOFOL N/A 03/13/2016   Procedure: ESOPHAGOGASTRODUODENOSCOPY (EGD) WITH PROPOFOL;  Surgeon: Lollie Sails, MD;  Location: [facility] ENDOSCOPY;  Service: Endoscopy;  Laterality: N/A;  . EYE SURGERY  08/16/2003   eye sug  . MOHS SURGERY  2011, 2012   left ear and right cheek  . STRABISMUS SURGERY  10/20/2015   2 horizontal muscles-left    Home Medications:  Allergies as of 12/24/2016      Reactions   Other Diarrhea   Other reaction(s): Abdominal Pain tarragon      Medication List       Accurate as of 12/24/16  9:34 AM. Always use your most recent med list.          calcium-vitamin D 500-200 MG-UNIT tablet Commonly known as:  OSCAL WITH D Take 1 tablet by mouth daily with breakfast.   desonide 0.05 % cream Commonly known as:  DESOWEN   finasteride 5 MG tablet Commonly known as:  PROSCAR Take 1 tablet (5 mg total) by mouth daily.  meclizine 25 MG tablet Commonly known as:  ANTIVERT Take 1 tablet (25 mg total) by mouth 3 (three) times daily as needed.   MULTI-VITAMINS Tabs Take by mouth daily.   omeprazole 20 MG capsule Commonly known as:  PRILOSEC TAKE 1 CAPSULE BY MOUTH ONCE DAILY 1 HOUR BEFORE A MEAL   tamsulosin 0.4 MG Caps capsule Commonly known as:  FLOMAX Take by mouth.   Testosterone 20.25 MG/ACT (1.62%) Gel Apply to shoulders and/or upper arms only 5 grams   valACYclovir 1000 MG tablet Commonly known as:  VALTREX Take 500 mg by mouth daily.       Allergies:  Allergies  Allergen Reactions  . Other Diarrhea    Other reaction(s): Abdominal Pain tarragon    Family History: Family History  Problem Relation Age of Onset  . Heart failure Mother   . Osteoporosis Mother   . Stroke Mother   .  Heart attack Father   . Gout Father   . Diabetes Mellitus II Father   . Renal Disease Father   . Alcohol abuse Father   . Osteoporosis Maternal Grandfather   . Osteoporosis Maternal Grandmother     Social History:  reports that he quit smoking about 55 years ago. His smoking use included Cigarettes. He has a 2.50 pack-year smoking history. He has never used smokeless tobacco. He reports that he drinks alcohol. He reports that he does not use drugs.  ROS: UROLOGY Frequent Urination?: No Hard to postpone urination?: No Burning/pain with urination?: No Get up at night to urinate?: Yes Leakage of urine?: No Urine stream starts and stops?: No Trouble starting stream?: No Do you have to strain to urinate?: No Blood in urine?: No Urinary tract infection?: No Sexually transmitted disease?: No Injury to kidneys or bladder?: No Painful intercourse?: No Weak stream?: No Erection problems?: No Penile pain?: No  Gastrointestinal Nausea?: No Vomiting?: No Indigestion/heartburn?: No Diarrhea?: No Constipation?: No  Constitutional Fever: No Night sweats?: No Weight loss?: No Fatigue?: No  Skin Skin rash/lesions?: No Itching?: No  Eyes Blurred vision?: No Double vision?: No  Ears/Nose/Throat Sore throat?: No Sinus problems?: Yes  Hematologic/Lymphatic Swollen glands?: No Easy bruising?: No  Cardiovascular Leg swelling?: No Chest pain?: No  Respiratory Cough?: No Shortness of breath?: No  Endocrine Excessive thirst?: No  Musculoskeletal Back pain?: Yes Joint pain?: No  Neurological Headaches?: No Dizziness?: No  Psychologic Depression?: No Anxiety?: No  Physical Exam: BP 131/76   Pulse 73   Ht 5\' 11"  (1.803 m)   Wt 77.4 kg (170 lb 9.6 oz)   BMI 23.79 kg/m   Constitutional:  Alert and oriented, No acute distress. HEENT: [place] AT, moist mucus membranes.  Trachea midline, no masses. Cardiovascular: No clubbing, cyanosis, or edema. Respiratory:  Normal respiratory effort, no increased work of breathing. GI: Abdomen is soft, nontender, nondistended, no abdominal masses GU: No CVA tenderness.  DRE - prostate 40 grams, smooth, broad. No hard area or nodule.  Skin: No rashes, bruises or suspicious lesions. Lymph: No cervical or inguinal adenopathy. Neurologic: Grossly intact, no focal deficits, moving all 4 extremities. Psychiatric: Normal mood and affect.  Laboratory Data: Lab Results  Component Value Date   WBC 5.3 12/03/2016   HGB 17.3 12/03/2016   HCT 49.9 12/03/2016   MCV 91.9 12/03/2016   PLT 107 (L) 12/03/2016    Lab Results  Component Value Date   CREATININE 1.14 12/03/2016    No results found for: PSA  No results found for: TESTOSTERONE  Lab Results  Component Value Date   HGBA1C 5.4 06/04/2014    Urinalysis    Component Value Date/Time   COLORURINE Yellow 06/03/2014 1536   APPEARANCEUR Clear 01/31/2016 1001   LABSPEC 1.013 06/03/2014 1536   PHURINE 5.0 06/03/2014 1536   GLUCOSEU Negative 01/31/2016 1001   GLUCOSEU Negative 06/03/2014 1536   HGBUR Negative 06/03/2014 1536   BILIRUBINUR Negative 01/31/2016 1001   BILIRUBINUR Negative 06/03/2014 1536   KETONESUR Negative 06/03/2014 1536   PROTEINUR Negative 01/31/2016 1001   PROTEINUR Negative 06/03/2014 1536   NITRITE Negative 01/31/2016 1001   NITRITE Negative 06/03/2014 1536   LEUKOCYTESUR Negative 01/31/2016 1001   LEUKOCYTESUR Negative 06/03/2014 1536     Assessment & Plan:    1) BPH with LUTS - bwe went over again the nature r/b of alpha blocker, 5ARI and also discussed procedures. He wants to continue combination therapy.   There are no diagnoses linked to this encounter.  No Follow-up on file.  Festus Aloe, [facility] [address] [place], [place] [address] [phone]

## 2016-12-25 ENCOUNTER — Ambulatory Visit: Payer: Medicare Other

## 2017-01-07 ENCOUNTER — Ambulatory Visit (INDEPENDENT_AMBULATORY_CARE_PROVIDER_SITE_OTHER): Payer: Medicare Other | Admitting: Podiatry

## 2017-01-07 DIAGNOSIS — L03032 Cellulitis of left toe: Secondary | ICD-10-CM | POA: Diagnosis not present

## 2017-01-07 DIAGNOSIS — L600 Ingrowing nail: Secondary | ICD-10-CM | POA: Diagnosis not present

## 2017-01-07 MED ORDER — CEPHALEXIN 500 MG PO CAPS: 500.0000 mg | ORAL_CAPSULE | Freq: Two times a day (BID) | ORAL | 0 refills | Status: DC

## 2017-01-07 NOTE — Addendum Note (Signed)
Addended by: Graceann Congress D on: 01/07/2017 03:52 PM   Modules accepted: Orders

## 2017-01-07 NOTE — Progress Notes (Signed)
Subjective:     Patient ID: [name], male   DOB: September 03, 1934, 83 y.o.   MRN: [id number]  HPI this patient presents the office with chief complaint of a painful right big toe. He states he developed an ingrown toenail one week ago. He says it immediately became painful and tender to the touch. He says it has improved now that he has worked on the nail himself. He denies any drainage or pus noted from the site. He presents the office today for an evaluation and treatment of this great toe   Review of Systems     Objective:   Physical Exam GENERAL APPEARANCE: Alert, conversant. Appropriately groomed. No acute distress.  VASCULAR: Pedal pulses are  palpable at  [facility] and PT bilateral.  Capillary refill time is immediate to all digits,  Normal temperature gradient.   NEUROLOGIC: sensation is normal to 5.07 monofilament at 5/5 sites bilateral.  Light touch is intact bilateral, Muscle strength normal.  MUSCULOSKELETAL: acceptable muscle strength, tone and stability bilateral.  Intrinsic muscluature intact bilateral.  Rectus appearance of foot and digits noted bilateral.   DERMATOLOGIC: skin color, texture, and turgor are within normal limits.  No preulcerative lesions or ulcers  are seen, no interdigital maceration noted.  No open lesions present.  . No drainage noted. NAILS  Redness and swelling along the medial border right great toe.  No pus or drainage noted.     Assessment:     Ingrown Toenail medial border right great toe.    Plan:     IE  Examination of his right great toe reveals no evidence of drainage or pus noted along the medial border of the right great toe. Patient does have persistent pain noted at this site. After evaluating his toe in discussion of the ingrowing toenail. We decided to allow him to perform home soaks and take antibiotics for 5 days. Patient will be reevaluated in the office on Thursday morning and if the problem persists, an incision and drainage will be  performed at that time. Prescribe 5 days of cephalexin.   Gardiner Barefoot DPM

## 2017-01-10 ENCOUNTER — Ambulatory Visit (INDEPENDENT_AMBULATORY_CARE_PROVIDER_SITE_OTHER): Payer: Medicare Other | Admitting: Podiatry

## 2017-01-10 ENCOUNTER — Encounter: Payer: Self-pay | Admitting: Podiatry

## 2017-01-10 DIAGNOSIS — L600 Ingrowing nail: Secondary | ICD-10-CM | POA: Diagnosis not present

## 2017-01-10 NOTE — Progress Notes (Signed)
Subjective:     Patient ID: [name], male   DOB: [birth year], 83 y.o.   MRN: [id number]  HPI this patient presents the office with chief complaint of a ingrown toenail. right big toe.  He gives a history of having an infected ingrown toenail. He says that his toenail has improved when seen on Monday and we discussed his condition. I told him to continue soaks and to allow the painful site to continue to heal. I told to return the office on Thursday morning and if the problem persists we will perform surgical correction of his nail. He was also given antibiotics for 5 days to help to clear up the self described infection   Review of Systems     Objective:   Physical Exam GENERAL APPEARANCE: Alert, conversant. Appropriately groomed. No acute distress.  VASCULAR: Pedal pulses are  palpable at  [facility] and PT bilateral.  Capillary refill time is immediate to all digits,  Normal temperature gradient.   NEUROLOGIC: sensation is normal to 5.07 monofilament at 5/5 sites bilateral.  Light touch is intact bilateral, Muscle strength normal.  MUSCULOSKELETAL: acceptable muscle strength, tone and stability bilateral.  Intrinsic muscluature intact bilateral.  Rectus appearance of foot and digits noted bilateral.   DERMATOLOGIC: skin color, texture, and turgor are within normal limits.  No preulcerative lesions or ulcers  are seen, no interdigital maceration noted.  No open lesions present.  . No drainage noted. NAILS  Redness and swelling has resolved  along the medial border right great toe.  No pus or drainage noted. A small, hard tissue is noted at the distal aspect of the medial border of the right great toe     Assessment:     Ingrown Toenail medial border right great toe.    Plan:      Examination of his right great toe reveals no evidence of drainage or pus noted along the medial border of the right great toe. Localized hard hyperkeratotic tissue noted at the distal aspect of the medial border  right great toe. This seems to be the remnant of a localized abscess.  This tissue was resected and patient was pain free.  RTC prn   Gardiner Barefoot DPM

## 2017-02-11 DIAGNOSIS — D693 Immune thrombocytopenic purpura: Secondary | ICD-10-CM | POA: Insufficient documentation

## 2017-03-07 ENCOUNTER — Telehealth: Payer: Self-pay | Admitting: Urology

## 2017-03-07 NOTE — Telephone Encounter (Signed)
[name] with [facility] pharmacy called [phone]) regarding a Tamsulosin (0.4) refill for this patient.   Patient has only seen Dr. Alyson Ingles in October 2017 and has a follow up with Larene Beach in April 2019.

## 2017-03-08 MED ORDER — TAMSULOSIN HCL 0.4 MG PO CAPS: 0.4000 mg | ORAL_CAPSULE | Freq: Every day | ORAL | 9 refills | Status: DC

## 2017-03-08 NOTE — Telephone Encounter (Signed)
Patient was seen in 12/2016 by Dr Junious Silk script refill sent

## 2017-05-10 ENCOUNTER — Other Ambulatory Visit: Payer: Self-pay

## 2017-05-10 DIAGNOSIS — N401 Enlarged prostate with lower urinary tract symptoms: Secondary | ICD-10-CM

## 2017-05-10 MED ORDER — FINASTERIDE 5 MG PO TABS: 5.0000 mg | ORAL_TABLET | Freq: Every day | ORAL | 3 refills | Status: AC

## 2017-06-03 ENCOUNTER — Inpatient Hospital Stay: Payer: Medicare Other | Admitting: Internal Medicine

## 2017-06-03 ENCOUNTER — Inpatient Hospital Stay: Payer: Medicare Other

## 2017-06-14 ENCOUNTER — Inpatient Hospital Stay (HOSPITAL_BASED_OUTPATIENT_CLINIC_OR_DEPARTMENT_OTHER): Payer: Medicare Other | Admitting: Internal Medicine

## 2017-06-14 ENCOUNTER — Inpatient Hospital Stay: Payer: Medicare Other | Attending: Internal Medicine

## 2017-06-14 VITALS — BP 149/81 | HR 87 | Temp 98.1°F | Resp 14 | Wt 171.6 lb

## 2017-06-14 DIAGNOSIS — M109 Gout, unspecified: Secondary | ICD-10-CM

## 2017-06-14 DIAGNOSIS — I6509 Occlusion and stenosis of unspecified vertebral artery: Secondary | ICD-10-CM | POA: Diagnosis not present

## 2017-06-14 DIAGNOSIS — Z8673 Personal history of transient ischemic attack (TIA), and cerebral infarction without residual deficits: Secondary | ICD-10-CM

## 2017-06-14 DIAGNOSIS — R161 Splenomegaly, not elsewhere classified: Secondary | ICD-10-CM | POA: Diagnosis not present

## 2017-06-14 DIAGNOSIS — Z79899 Other long term (current) drug therapy: Secondary | ICD-10-CM

## 2017-06-14 DIAGNOSIS — K219 Gastro-esophageal reflux disease without esophagitis: Secondary | ICD-10-CM | POA: Insufficient documentation

## 2017-06-14 DIAGNOSIS — Z86010 Personal history of colonic polyps: Secondary | ICD-10-CM | POA: Insufficient documentation

## 2017-06-14 DIAGNOSIS — Z85820 Personal history of malignant melanoma of skin: Secondary | ICD-10-CM | POA: Diagnosis not present

## 2017-06-14 DIAGNOSIS — I499 Cardiac arrhythmia, unspecified: Secondary | ICD-10-CM | POA: Insufficient documentation

## 2017-06-14 DIAGNOSIS — Z87891 Personal history of nicotine dependence: Secondary | ICD-10-CM

## 2017-06-14 DIAGNOSIS — I4940 Unspecified premature depolarization: Secondary | ICD-10-CM

## 2017-06-14 DIAGNOSIS — D696 Thrombocytopenia, unspecified: Secondary | ICD-10-CM | POA: Insufficient documentation

## 2017-06-14 DIAGNOSIS — Z87442 Personal history of urinary calculi: Secondary | ICD-10-CM

## 2017-06-14 DIAGNOSIS — G458 Other transient cerebral ischemic attacks and related syndromes: Secondary | ICD-10-CM

## 2017-06-14 DIAGNOSIS — I10 Essential (primary) hypertension: Secondary | ICD-10-CM | POA: Insufficient documentation

## 2017-06-14 DIAGNOSIS — K7689 Other specified diseases of liver: Secondary | ICD-10-CM | POA: Insufficient documentation

## 2017-06-14 DIAGNOSIS — Z8719 Personal history of other diseases of the digestive system: Secondary | ICD-10-CM | POA: Diagnosis not present

## 2017-06-14 DIAGNOSIS — Z8619 Personal history of other infectious and parasitic diseases: Secondary | ICD-10-CM | POA: Diagnosis not present

## 2017-06-14 LAB — CBC WITH DIFFERENTIAL/PLATELET
BASOS ABS: 0 10*3/uL (ref 0–0.1)
BASOS PCT: 1 %
EOS ABS: 0.1 10*3/uL (ref 0–0.7)
EOS PCT: 1 %
HCT: 51.6 % (ref 40.0–52.0)
HEMOGLOBIN: 17.4 g/dL (ref 13.0–18.0)
LYMPHS ABS: 0.8 10*3/uL — AB (ref 1.0–3.6)
Lymphocytes Relative: 12 %
MCH: 31.6 pg (ref 26.0–34.0)
MCHC: 33.8 g/dL (ref 32.0–36.0)
MCV: 93.5 fL (ref 80.0–100.0)
Monocytes Absolute: 0.4 10*3/uL (ref 0.2–1.0)
Monocytes Relative: 7 %
NEUTROS PCT: 79 %
Neutro Abs: 5.3 10*3/uL (ref 1.4–6.5)
PLATELETS: 121 10*3/uL — AB (ref 150–440)
RBC: 5.52 MIL/uL (ref 4.40–5.90)
RDW: 13.2 % (ref 11.5–14.5)
WBC: 6.6 10*3/uL (ref 3.8–10.6)

## 2017-06-14 LAB — COMPREHENSIVE METABOLIC PANEL
ALBUMIN: 4.3 g/dL (ref 3.5–5.0)
ALK PHOS: 51 U/L (ref 38–126)
ALT: 17 U/L (ref 17–63)
AST: 34 U/L (ref 15–41)
Anion gap: 9 (ref 5–15)
BUN: 21 mg/dL — ABNORMAL HIGH (ref 6–20)
CALCIUM: 9.1 mg/dL (ref 8.9–10.3)
CHLORIDE: 104 mmol/L (ref 101–111)
CO2: 28 mmol/L (ref 22–32)
CREATININE: 1.31 mg/dL — AB (ref 0.61–1.24)
GFR calc non Af Amer: 49 mL/min — ABNORMAL LOW (ref >60)
GFR, EST AFRICAN AMERICAN: 57 mL/min — AB (ref >60)
GLUCOSE: 121 mg/dL — AB (ref 65–99)
Potassium: 3.7 mmol/L (ref 3.5–5.1)
SODIUM: 141 mmol/L (ref 135–145)
Total Bilirubin: 1.1 mg/dL (ref 0.3–1.2)
Total Protein: 7 g/dL (ref 6.5–8.1)

## 2017-06-14 NOTE — Progress Notes (Signed)
Patient is here today for a follow up. Patient states no new concerns today.  

## 2017-06-14 NOTE — Assessment & Plan Note (Addendum)
#  Chronic thrombocytopenia isolated- > 100 [since 2015]-  ITP [most likely cause] vs? Alcohol use Vs  Vs mild splenomegaly vs versus primary bone marrow process like myelodysplastic syndrome  [less likley] . Today platelets 121; monitor for now.   # Patient is fairly asymptomatic at this time no indications for any further workup or any treatment this time. Continue to hold off bone marrow biopsy.  # follow up in 12 months/labs [will see PCP in spring 2019]  Cc; Dr.Bronstein

## 2017-06-14 NOTE — Progress Notes (Signed)
[place] NOTE  Patient Care Team: [name], MD as PCP - General (Family Medicine) Rockey Situ Kathlene November, MD as Consulting Physician (Cardiology)  CHIEF COMPLAINTS/PURPOSE OF CONSULTATION:   # 2013- CHRONIC ISOLATED THROMBOCYTOPENIA- 130-99; May 2017- 115 [place]- 2015- MILD splenomegaly; liver Elastography- Dr.Skulskie-N/ no cirrhosis; hepatitis B/C- NEG; Monoclonal work up-NEG  # On testosterone supplementation  HISTORY OF PRESENTING ILLNESS:  [name] 83 y.o.  male here for follow-up because of his thrombocytopenia.    He continues to drink 4-5 cocktails each night; denies any change in his drinking habits. Denies any falls. Denies any loss of appetite night sweats chest pain or shortness of breath or cough. Patient denies any bleeding gums or bleeding nose. Denies easy bruising. He is not on any blood thinners.  ROS: A complete 10 point review of system is done which is negative except mentioned above in history of present illness  MEDICAL HISTORY:  Past Medical History:  Diagnosis Date  . Adenomatous polyp 2012  . Allergic rhinitis   . Arrhythmia   . 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 Cancer ([place])    melanoma  . Cardiac arrhythmia   . Cataract   . Chicken pox   . Colon polyp 12/29/10, 04/18/06, 01/05/03  . Diplopia   . Essential hypertension   . Exotropia, left eye   . GERD (gastroesophageal reflux disease)   . Gout   . H/O: CVA (cerebrovascular accident)   . Hemorrhoid   . Hepatic cyst   . Low testosterone   . Melanoma in situ ([place])    left ear, right cheek  . Migraine   . Near syncope   . Nephrolithiasis   . Nephrolithiasis   . Occlusion and stenosis of vertebral artery   . Psoriasis   . Reflux   . Splenomegaly   . Strabismus   . Stroke ([place])   . Subclavian steal syndrome   . Syncope and collapse   . Thrombocytopenia ([place])   . Ventricular premature depolarization   . Vertebral artery  stenosis     SURGICAL HISTORY: Past Surgical History:  Procedure Laterality Date  . APPENDECTOMY    . COLONOSCOPY    . COLONOSCOPY WITH PROPOFOL N/A 03/13/2016   Procedure: COLONOSCOPY WITH PROPOFOL;  Surgeon: Lollie Sails, MD;  Location: [facility] ENDOSCOPY;  Service: Endoscopy;  Laterality: N/A;  . diplopia    . ESOPHAGOGASTRODUODENOSCOPY  08/2014, 06/14/2012  . ESOPHAGOGASTRODUODENOSCOPY (EGD) WITH PROPOFOL N/A 03/13/2016   Procedure: ESOPHAGOGASTRODUODENOSCOPY (EGD) WITH PROPOFOL;  Surgeon: Lollie Sails, MD;  Location: [facility] ENDOSCOPY;  Service: Endoscopy;  Laterality: N/A;  . EYE SURGERY  08/16/2003   eye sug  . MOHS SURGERY  2011, 2012   left ear and right cheek  . STRABISMUS SURGERY  10/20/2015   2 horizontal muscles-left    SOCIAL HISTORY: ; no smoking; 4-5 cocktails a week; retired from [employer].  Social History   Social History  . Marital status: Married    Spouse name: N/A  . Number of children: N/A  . Years of education: N/A   Occupational History  . Not on file.   Social History Main Topics  . Smoking status: Former Smoker    Packs/day: 0.25    Years: 10.00    Types: Cigarettes    Quit date: 01/30/1961  . Smokeless tobacco: Never Used  . Alcohol use 0.0 oz/week     Comment: social drinker  . Drug use: No  .  Sexual activity: Not on file   Other Topics Concern  . Not on file   Social History Narrative  . No narrative on file    FAMILY HISTORY: Family History  Problem Relation Age of Onset  . Heart failure Mother   . Osteoporosis Mother   . Stroke Mother   . Heart attack Father   . Gout Father   . Diabetes Mellitus II Father   . Renal Disease Father   . Alcohol abuse Father   . Osteoporosis Maternal Grandfather   . Osteoporosis Maternal Grandmother     ALLERGIES:  is allergic to other.  MEDICATIONS:  Current Outpatient Prescriptions  Medication Sig Dispense Refill  . calcium-vitamin D (OSCAL WITH D) 500-200 MG-UNIT  tablet Take 1 tablet by mouth daily with breakfast.    . desonide (DESOWEN) 0.05 % cream     . finasteride (PROSCAR) 5 MG tablet Take 1 tablet (5 mg total) by mouth daily. 90 tablet 3  . meclizine (ANTIVERT) 25 MG tablet Take 1 tablet (25 mg total) by mouth 3 (three) times daily as needed. 60 tablet 3  . Multiple Vitamin (MULTI-VITAMINS) TABS Take by mouth daily.     Marland Kitchen omeprazole (PRILOSEC) 20 MG capsule TAKE 1 CAPSULE BY MOUTH ONCE DAILY 1 HOUR BEFORE A MEAL    . tamsulosin (FLOMAX) 0.4 MG CAPS capsule Take 1 capsule (0.4 mg total) by mouth daily. 30 capsule 9  . valACYclovir (VALTREX) 1000 MG tablet Take 500 mg by mouth daily.     . Testosterone 20.25 MG/ACT (1.62%) GEL Apply to shoulders and/or upper arms only 5 grams     No current facility-administered medications for this visit.       Marland Kitchen  PHYSICAL EXAMINATION:   Vitals:   06/14/17 1450  BP: (!) 149/81  Pulse: 87  Resp: 14  Temp: 98.1 F (36.7 C)   Filed Weights   06/14/17 1450  Weight: 171 lb 9.6 oz (77.8 kg)    GENERAL: Well-nourished well-developed; Alert, no distress and comfortable.   Alone. Patient looks much younger than his stated age. EYES: no pallor or icterus OROPHARYNX: no thrush or ulceration; good dentition  NECK: supple, no masses felt LYMPH:  no palpable lymphadenopathy in the cervical, axillary or inguinal regions LUNGS: clear to auscultation and  No wheeze or crackles HEART/CVS: regular rate & rhythm and no murmurs; No lower extremity edema ABDOMEN: abdomen soft, non-tender and normal bowel sounds Musculoskeletal:no cyanosis of digits and no clubbing  PSYCH: alert & oriented x 3 with fluent speech NEURO: no focal motor/sensory deficits SKIN:  no rashes or significant lesions  LABORATORY DATA:  I have reviewed the data as listed Lab Results  Component Value Date   WBC 6.6 06/14/2017   HGB 17.4 06/14/2017   HCT 51.6 06/14/2017   MCV 93.5 06/14/2017   PLT 121 (L) 06/14/2017    Recent Labs   12/03/16 1005 06/14/17 1420  NA 139 141  K 4.2 3.7  CL 103 104  CO2 30 28  GLUCOSE 100* 121*  BUN 22* 21*  CREATININE 1.14 1.31*  CALCIUM 9.0 9.1  GFRNONAA 59* 49*  GFRAA >60 57*  PROT 7.0 7.0  ALBUMIN 4.3 4.3  AST 24 34  ALT 18 17  ALKPHOS 63 51  BILITOT 1.0 1.1     ASSESSMENT & PLAN:   Thrombocytopenia ([facility]) # Chronic thrombocytopenia isolated- > 100 [since 2015]-  ITP [most likely cause] vs? Alcohol use Vs  Vs mild splenomegaly vs versus  primary bone marrow process like myelodysplastic syndrome  [less likley] . Today platelets 121; monitor for now.   # Patient is fairly asymptomatic at this time no indications for any further workup or any treatment this time. Continue to hold off bone marrow biopsy.  # follow up in 12 months/labs [will see PCP in spring 2019]  Cc; Dr.Bronstein    Cammie Sickle, MD 06/14/2017 3:53 PM

## 2017-07-08 ENCOUNTER — Encounter: Payer: Self-pay | Admitting: Podiatry

## 2017-07-08 ENCOUNTER — Ambulatory Visit (INDEPENDENT_AMBULATORY_CARE_PROVIDER_SITE_OTHER): Payer: Medicare Other

## 2017-07-08 ENCOUNTER — Ambulatory Visit (INDEPENDENT_AMBULATORY_CARE_PROVIDER_SITE_OTHER): Payer: Medicare Other | Admitting: Podiatry

## 2017-07-08 VITALS — BP 143/89 | HR 70 | Resp 16

## 2017-07-08 DIAGNOSIS — M7751 Other enthesopathy of right foot: Secondary | ICD-10-CM | POA: Diagnosis not present

## 2017-07-08 DIAGNOSIS — M129 Arthropathy, unspecified: Secondary | ICD-10-CM

## 2017-07-08 DIAGNOSIS — M778 Other enthesopathies, not elsewhere classified: Secondary | ICD-10-CM

## 2017-07-08 DIAGNOSIS — M779 Enthesopathy, unspecified: Principal | ICD-10-CM

## 2017-07-08 MED ORDER — MELOXICAM 15 MG PO TABS: 15.0000 mg | ORAL_TABLET | Freq: Every day | ORAL | 3 refills | Status: DC

## 2017-07-08 NOTE — Progress Notes (Signed)
This patient presents the office with chief complaint of pain and redness on his right midfoot.  He says this is been present for approximately 2-4 weeks.  Patient states he is very active and even does biking.  He says he started developing pain and discomfort on his midfoot which  later became red and inflamed. [name]  He says his pain level is approximately 4 out of 10.  He denies any history of trauma or injury to the foot.  No self treatment nor sought any professional help.  He presents the office today for evaluation of his right midfoot.  General Appearance  Alert, conversant and in no acute stress.  Vascular  Dorsalis pedis and posterior pulses are palpable  bilaterally.  Capillary return is within normal limits  Bilaterally. Temperature is within normal limits  Bilaterally  Neurologic  Senn-Weinstein monofilament wire test within normal limits  bilaterally. Muscle power  Within normal limits bilaterally.  Nails neurotropic nails noted with no evidence of any bacterial or fungal infection.  Orthopedic  No limitations of motion of motion feet bilaterally.  No crepitus or effusions noted.  No bony pathology or digital deformities noted. Redness and pain noted at the first metatarsal cuneiform joint, right foot.  Skin  normotropic skin with no porokeratosis noted bilaterally.  No signs of infections or ulcers noted.    Right foot arthritis.  ROV  X-rays are taken and no evidence of bony pathology noted.  Discussed condition with patient and we decided to treat him initially with multiple diabetic and if the problem persists, we will then consider injection therapy.  RTC prn.   Gardiner Barefoot DPM

## 2017-12-03 DIAGNOSIS — Z9889 Other specified postprocedural states: Secondary | ICD-10-CM | POA: Insufficient documentation

## 2017-12-03 DIAGNOSIS — H532 Diplopia: Secondary | ICD-10-CM | POA: Insufficient documentation

## 2017-12-03 DIAGNOSIS — H5021 Vertical strabismus, right eye: Secondary | ICD-10-CM | POA: Insufficient documentation

## 2017-12-03 DIAGNOSIS — H50012 Monocular esotropia, left eye: Secondary | ICD-10-CM | POA: Insufficient documentation

## 2017-12-03 DIAGNOSIS — H5017 Alternating exotropia with V pattern: Secondary | ICD-10-CM | POA: Insufficient documentation

## 2017-12-23 ENCOUNTER — Ambulatory Visit: Payer: Medicare Other | Admitting: Urology

## 2018-01-13 NOTE — Progress Notes (Signed)
01/15/2018 9:11 AM   [name] 28-Nov-1933 [id number]  Referring provider: [name], MD [phone] [place], [place] [address]  Chief Complaint  Patient presents with  . Follow-up    HPI: 84 yo WM with BPH with LU TS who presents today for a one year follow up.  BPH WITH LUTS  (prostate and/or bladder) IPSS score: 8/4  PVR: 31 mL  Previous score: 9/3  Previous PVR: 26 mL  Major complaint(s):  Very weak stream x 2 years.  This is very bothersome to him.  Denies any dysuria, hematuria or suprapubic pain.   Currently taking: tamsulosin 0.4 mg daily (01/2016) and finasteride 5 mg daily (06/2016)  Denies any recent fevers, chills, nausea or vomiting.  He does not have a family history of PCa.  IPSS    Row Name 01/15/18 0800         International Prostate Symptom Score   How often have you had the sensation of not emptying your bladder?  Less than 1 in 5     How often have you had to urinate less than every two hours?  Not at All     How often have you found you stopped and started again several times when you urinated?  Less than 1 in 5 times     How often have you found it difficult to postpone urination?  Not at All     How often have you had a weak urinary stream?  Almost always     How often have you had to strain to start urination?  Not at All     How many times did you typically get up at night to urinate?  1 Time     Total IPSS Score  8       Quality of Life due to urinary symptoms   If you were to spend the rest of your life with your urinary condition just the way it is now how would you feel about that?  Mostly Disatisfied        Score:  1-7 Mild 8-19 Moderate 20-35 Severe  He is on androgel and per patient his PSA was 0.44 in 02/2017.  Followed by Dr. Lovie Macadamia  He has been experiencing right-sided flank pain for the last month.  He states that time the pain is very severe.  He states it is worse in the morning but then the pain eases off or  completely abates.  He has not noted any radiation with this pain.  He feels it may be arthritic in nature but he does have a history of nephrolithiasis and has some mild concerned it may be a stone.     PMH: Past Medical History:  Diagnosis Date  . Adenomatous polyp 2012  . Allergic rhinitis   . Arrhythmia   . 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 Cancer ([place])    melanoma  . Cardiac arrhythmia   . Cataract   . Chicken pox   . Colon polyp 12/29/10, 04/18/06, 01/05/03  . Diplopia   . Essential hypertension   . Exotropia, left eye   . GERD (gastroesophageal reflux disease)   . Gout   . H/O: CVA (cerebrovascular accident)   . Hemorrhoid   . Hepatic cyst   . Low testosterone   . Melanoma in situ ([place])    left ear, right cheek  . Migraine   . Near syncope   . Nephrolithiasis   .  Nephrolithiasis   . Occlusion and stenosis of vertebral artery   . Psoriasis   . Reflux   . Splenomegaly   . Strabismus   . Stroke ([place])   . Subclavian steal syndrome   . Syncope and collapse   . Thrombocytopenia ([place])   . Ventricular premature depolarization   . Vertebral artery stenosis     Surgical History: Past Surgical History:  Procedure Laterality Date  . APPENDECTOMY    . COLONOSCOPY    . COLONOSCOPY WITH PROPOFOL N/A 03/13/2016   Procedure: COLONOSCOPY WITH PROPOFOL;  Surgeon: Lollie Sails, MD;  Location: [facility] ENDOSCOPY;  Service: Endoscopy;  Laterality: N/A;  . diplopia    . ESOPHAGOGASTRODUODENOSCOPY  08/2014, 06/14/2012  . ESOPHAGOGASTRODUODENOSCOPY (EGD) WITH PROPOFOL N/A 03/13/2016   Procedure: ESOPHAGOGASTRODUODENOSCOPY (EGD) WITH PROPOFOL;  Surgeon: Lollie Sails, MD;  Location: [facility] ENDOSCOPY;  Service: Endoscopy;  Laterality: N/A;  . EYE SURGERY  08/16/2003   eye sug  . MOHS SURGERY  2011, 2012   left ear and right cheek  . STRABISMUS SURGERY  10/20/2015   2 horizontal muscles-left    Home Medications:  Allergies as of  01/15/2018      Reactions   Other Diarrhea   Other reaction(s): Abdominal Pain tarragon Other reaction(s): Abdominal Pain tarragon      Medication List        Accurate as of 01/15/18  9:11 AM. Always use your most recent med list.          calcium-vitamin D 500-200 MG-UNIT tablet Commonly known as:  OSCAL WITH D Take 1 tablet by mouth daily with breakfast.   desonide 0.05 % cream Commonly known as:  DESOWEN   finasteride 5 MG tablet Commonly known as:  PROSCAR Take 1 tablet (5 mg total) by mouth daily.   meclizine 25 MG tablet Commonly known as:  ANTIVERT Take 1 tablet (25 mg total) by mouth 3 (three) times daily as needed.   meloxicam 15 MG tablet Commonly known as:  MOBIC Take 1 tablet (15 mg total) daily by mouth.   MULTI-VITAMINS Tabs Take by mouth daily.   omeprazole 20 MG capsule Commonly known as:  PRILOSEC TAKE 1 CAPSULE BY MOUTH ONCE DAILY 1 HOUR BEFORE A MEAL   tacrolimus 0.1 % ointment Commonly known as:  PROTOPIC   tamsulosin 0.4 MG Caps capsule Commonly known as:  FLOMAX Take 1 capsule (0.4 mg total) by mouth daily.   Testosterone 20.25 MG/ACT (1.62%) Gel Apply to shoulders and/or upper arms only 5 grams   valACYclovir 1000 MG tablet Commonly known as:  VALTREX Take 500 mg by mouth daily.       Allergies:  Allergies  Allergen Reactions  . Other Diarrhea    Other reaction(s): Abdominal Pain tarragon Other reaction(s): Abdominal Pain tarragon    Family History: Family History  Problem Relation Age of Onset  . Heart failure Mother   . Osteoporosis Mother   . Stroke Mother   . Heart attack Father   . Gout Father   . Diabetes Mellitus II Father   . Renal Disease Father   . Alcohol abuse Father   . Osteoporosis Maternal Grandfather   . Osteoporosis Maternal Grandmother     Social History:  reports that he quit smoking about 56 years ago. His smoking use included cigarettes. He has a 2.50 pack-year smoking history. He has never  used smokeless tobacco. He reports that he drinks alcohol. He reports that he does not use drugs.  ROS: UROLOGY Frequent Urination?: No Hard to postpone urination?: No Burning/pain with urination?: No Get up at night to urinate?: No Leakage of urine?: No Urine stream starts and stops?: No Trouble starting stream?: Yes Do you have to strain to urinate?: No Blood in urine?: No Urinary tract infection?: No Sexually transmitted disease?: No Injury to kidneys or bladder?: No Painful intercourse?: No Weak stream?: No Erection problems?: No Penile pain?: No  Gastrointestinal Nausea?: No Vomiting?: No Indigestion/heartburn?: No Diarrhea?: No Constipation?: No  Constitutional Fever: No Night sweats?: No Weight loss?: No Fatigue?: No  Skin Skin rash/lesions?: No Itching?: No  Eyes Blurred vision?: No Double vision?: No  Ears/Nose/Throat Sore throat?: No Sinus problems?: No  Hematologic/Lymphatic Swollen glands?: No Easy bruising?: No  Cardiovascular Leg swelling?: No Chest pain?: No  Respiratory Cough?: No Shortness of breath?: No  Endocrine Excessive thirst?: No  Musculoskeletal Back pain?: Yes Joint pain?: No  Neurological Headaches?: No Dizziness?: No  Psychologic Depression?: No Anxiety?: No  Physical Exam: BP 127/64 (BP Location: Right Arm, Patient Position: Sitting, Cuff Size: Normal)   Pulse 61   Ht 6' (1.829 m)   Wt 165 lb 14.4 oz (75.3 kg)   SpO2 99%   BMI 22.50 kg/m   Constitutional: Well nourished. Alert and oriented, No acute distress. HEENT: [place] AT, moist mucus membranes. Trachea midline, no masses. Cardiovascular: No clubbing, cyanosis, or edema. Respiratory: Normal respiratory effort, no increased work of breathing. GI: Abdomen is soft, non tender, non distended, no abdominal masses. Liver and spleen not palpable.  No hernias appreciated.  Stool sample for occult testing is not indicated.   GU: No CVA tenderness.  No bladder  fullness or masses.  Patient with circumcised phallus.   Urethral meatus is patent.  No penile discharge.  2 mm in diameter papula above the meatus.  Patient states it is from [place] while biking.  Scrotum without lesions, cysts, rashes and/or edema.  Testicles are located scrotally bilaterally. No masses are appreciated in the testicles. Left and right epididymis are normal. Rectal: Patient with  normal sphincter tone. Anus and perineum without scarring or rashes. No rectal masses are appreciated. Prostate is approximately 50 grams, no nodules are appreciated. Seminal vesicles are normal. Skin: No rashes, bruises or suspicious lesions. Lymph: No cervical or inguinal adenopathy. Neurologic: Grossly intact, no focal deficits, moving all 4 extremities. Psychiatric: Normal mood and affect.   Laboratory Data: Lab Results  Component Value Date   WBC 6.6 06/14/2017   HGB 17.4 06/14/2017   HCT 51.6 06/14/2017   MCV 93.5 06/14/2017   PLT 121 (L) 06/14/2017    Lab Results  Component Value Date   CREATININE 1.31 (H) 06/14/2017    No results found for: PSA  No results found for: TESTOSTERONE  Lab Results  Component Value Date   HGBA1C 5.4 06/04/2014    Urinalysis    Component Value Date/Time   COLORURINE Yellow 06/03/2014 1536   APPEARANCEUR Clear 01/31/2016 1001   LABSPEC 1.013 06/03/2014 1536   PHURINE 5.0 06/03/2014 1536   GLUCOSEU Negative 01/31/2016 1001   GLUCOSEU Negative 06/03/2014 1536   HGBUR Negative 06/03/2014 1536   BILIRUBINUR Negative 01/31/2016 1001   BILIRUBINUR Negative 06/03/2014 1536   KETONESUR Negative 06/03/2014 1536   PROTEINUR Negative 01/31/2016 1001   PROTEINUR Negative 06/03/2014 1536   NITRITE Negative 01/31/2016 1001   NITRITE Negative 06/03/2014 1536   LEUKOCYTESUR Negative 01/31/2016 1001   LEUKOCYTESUR Negative 06/03/2014 1536   I have reviewed the labs  Pertinent Imaging Results for RAYMIR, FROMMELT (MRN [id number]) as of 01/15/2018  09:02  Ref. Range 01/15/2018 08:58  Scan Result Unknown 50ml    Assessment & Plan:    1. BPH with LUTS  - IPSS score is 8/4, it is stable   - Continue conservative management, avoiding bladder irritants and timed voiding's  - most bothersome symptoms is/are a weak stream  - Continue tamsulosin 0.4 mg daily and finasteride 5 mg  Did discuss that AndroGel may be contributing to worsening of his lower urinary tract symptoms  - Medication failure, schedule cystoscopy  I have explained to the patient that they will  be scheduled for a cystoscopy in our office to evaluate their bladder.  The cystoscopy consists of passing a tube with a lens up through their urethra and into their urinary bladder.   We will inject the urethra with a lidocaine gel prior to introducing the cystoscope to help with any discomfort during the procedure.   After the procedure, they might experience blood in the urine and discomfort with urination.  This will abate after the first few voids.  I have  encouraged the patient to increase water intake  during this time.  Patient denies any allergies to lidocaine.   2. Right Flank pain KUB today Advised to contact our office or seek treatment in the ED if becomes febrile or pain/ vomiting are difficult control in order to arrange for emergent/urgent intervention      Return for cystoscopy for BOO. Zara Council, PA-C  [facility] [address] [place], [place] [address] [phone]

## 2018-01-15 ENCOUNTER — Encounter: Payer: Self-pay | Admitting: Urology

## 2018-01-15 ENCOUNTER — Ambulatory Visit
Admission: RE | Admit: 2018-01-15 | Discharge: 2018-01-15 | Disposition: A | Discharge status: Home / Self Care | Payer: Medicare Other | Source: Ambulatory Visit | Attending: Urology | Admitting: Urology

## 2018-01-15 ENCOUNTER — Ambulatory Visit: Payer: Medicare Other | Admitting: Urology

## 2018-01-15 ENCOUNTER — Other Ambulatory Visit: Payer: Self-pay | Admitting: Urology

## 2018-01-15 VITALS — BP 127/64 | HR 61 | Ht 72.0 in | Wt 165.9 lb

## 2018-01-15 DIAGNOSIS — R109 Unspecified abdominal pain: Secondary | ICD-10-CM | POA: Insufficient documentation

## 2018-01-15 DIAGNOSIS — N138 Other obstructive and reflux uropathy: Secondary | ICD-10-CM

## 2018-01-15 DIAGNOSIS — N401 Enlarged prostate with lower urinary tract symptoms: Secondary | ICD-10-CM

## 2018-01-15 LAB — BLADDER SCAN AMB NON-IMAGING

## 2018-01-15 NOTE — Patient Instructions (Signed)
Cystoscopy  Cystoscopy is a procedure that is used to help diagnose and sometimes treat conditions that affect that lower urinary tract. The lower urinary tract includes the bladder and the tube that drains urine from the bladder out of the body (urethra). Cystoscopy is performed with a thin, tube-shaped instrument with a light and camera at the end (cystoscope). The cystoscope may be hard (rigid) or flexible, depending on the goal of the procedure.The cystoscope is inserted through the urethra, into the bladder.  Cystoscopy may be recommended if you have:   Urinary tractinfections that keep coming back (recurring).   Blood in the urine (hematuria).   Loss of bladder control (urinary incontinence) or an overactive bladder.   Unusual cells found in a urine sample.   A blockage in the urethra.   Painful urination.   An abnormality in the bladder found during an intravenous pyelogram (IVP) or CT scan.    Cystoscopy may also be done to remove a sample of tissue to be examined under a microscope (biopsy).  Tell a health care provider about:   Any allergies you have.   All medicines you are taking, including vitamins, herbs, eye drops, creams, and over-the-counter medicines.   Any problems you or family members have had with anesthetic medicines.   Any blood disorders you have.   Any surgeries you have had.   Any medical conditions you have.   Whether you are pregnant or may be pregnant.  What are the risks?  Generally, this is a safe procedure. However, problems may occur, including:   Infection.   Bleeding.   Allergic reactions to medicines.   Damage to other structures or organs.    What happens before the procedure?   Ask your health care provider about:  ? Changing or stopping your regular medicines. This is especially important if you are taking diabetes medicines or blood thinners.  ? Taking medicines such as aspirin and ibuprofen. These medicines can thin your blood. Do not take these medicines  before your procedure if your health care provider instructs you not to.   Follow instructions from your health care provider about eating or drinking restrictions.   You may be given antibiotic medicine to help prevent infection.   You may have an exam or testing, such as X-rays of the bladder, urethra, or kidneys.   You may have urine tests to check for signs of infection.   Plan to have someone take you home after the procedure.  What happens during the procedure?   To reduce your risk of infection,your health care team will wash or sanitize their hands.   You will be given one or more of the following:  ? A medicine to help you relax (sedative).  ? A medicine to numb the area (local anesthetic).   The area around the opening of your urethra will be cleaned.   The cystoscope will be passed through your urethra into your bladder.   Germ-free (sterile)fluid will flow through the cystoscope to fill your bladder. The fluid will stretch your bladder so that your surgeon can clearly examine your bladder walls.   The cystoscope will be removed and your bladder will be emptied.  The procedure may vary among health care providers and hospitals.  What happens after the procedure?   You may have some soreness or pain in your abdomen and urethra. Medicines will be available to help you.   You may have some blood in your urine.   Do not   drive for 24 hours if you received a sedative.  This information is not intended to replace advice given to you by your health care provider. Make sure you discuss any questions you have with your health care provider.  Document Released: 08/17/2000 Document Revised: 12/29/2015 Document Reviewed: 07/07/2015  Elsevier Interactive Patient Education  2018 [employer]

## 2018-01-16 ENCOUNTER — Telehealth: Payer: Self-pay

## 2018-01-16 NOTE — Telephone Encounter (Signed)
-----   Message from Nori Riis, PA-C sent at 01/15/2018  4:55 PM EDT ----- Please let Mr. Damron know that there were no stones seen.

## 2018-02-07 ENCOUNTER — Other Ambulatory Visit: Payer: Medicare Other

## 2018-02-27 ENCOUNTER — Ambulatory Visit: Payer: Medicare Other | Admitting: Urology

## 2018-02-27 ENCOUNTER — Encounter: Payer: Self-pay | Admitting: Urology

## 2018-02-27 VITALS — BP 138/56 | HR 62 | Ht 72.0 in | Wt 163.3 lb

## 2018-02-27 DIAGNOSIS — N138 Other obstructive and reflux uropathy: Secondary | ICD-10-CM | POA: Diagnosis not present

## 2018-02-27 DIAGNOSIS — N401 Enlarged prostate with lower urinary tract symptoms: Secondary | ICD-10-CM

## 2018-02-27 MED ORDER — CIPROFLOXACIN HCL 500 MG PO TABS
500.0000 mg | ORAL_TABLET | Freq: Once | ORAL | Status: AC
  Administered 2018-02-27: 500 mg via ORAL

## 2018-02-27 MED ORDER — LIDOCAINE HCL URETHRAL/MUCOSAL 2 % EX GEL
1.0000 "application " | Freq: Once | CUTANEOUS | Status: AC
  Administered 2018-02-27: 1 via URETHRAL

## 2018-02-27 NOTE — Progress Notes (Signed)
02/27/18  CC: No chief complaint on file.   HPI:  84 yo WM returns for cystoscopy due to BPH and weak stream.   BPH WITH LUTS  (prostate and/or bladder) IPSS score: 8/4  PVR: 31 mL  Previous score: 9/3  Previous PVR: 26 mL  Major complaint(s):  Very weak stream x 2 years.  This is very bothersome to him.  Denies any dysuria, hematuria or suprapubic pain.   Currently taking: tamsulosin 0.4 mg daily (01/2016) and finasteride 5 mg daily (06/2016)  Denies any recent fevers, chills, nausea or vomiting.  He does not have a family history of PCa.         IPSS           Row Name 01/15/18 0800               International Prostate Symptom Score    How often have you had the sensation of not emptying your bladder?  Less than 1 in 5      How often have you had to urinate less than every two hours?  Not at All      How often have you found you stopped and started again several times when you urinated?  Less than 1 in 5 times      How often have you found it difficult to postpone urination?  Not at All      How often have you had a weak urinary stream?  Almost always      How often have you had to strain to start urination?  Not at All      How many times did you typically get up at night to urinate?  1 Time      Total IPSS Score  8             Quality of Life due to urinary symptoms    If you were to spend the rest of your life with your urinary condition just the way it is now how would you feel about that?  Mostly Disatisfied         Score:  1-7 Mild 8-19 Moderate 20-35 Severe  He is on androgel and per patient his PSA was 0.44 in 02/2017.  Followed by Dr. Lovie Macadamia  He has been experiencing right-sided flank pain. He feels it may be arthritic in nature but he does have a history of nephrolithiasis and has some mild concerned it may be a stone. KUB was negative May 2019.  There were no vitals taken for this visit. NED. A&Ox3.     No respiratory distress   Abd soft, NT, ND Normal phallus with bilateral descended testicles  Cystoscopy Procedure Note  Patient identification was confirmed, informed consent was obtained, and patient was prepped using Betadine solution.  Lidocaine jelly was administered per urethral meatus.    Preoperative abx where received prior to procedure.     Pre-Procedure: - Inspection reveals a normal caliber ureteral meatus.  Procedure: The flexible cystoscope was introduced without difficulty - No urethral strictures/lesions are present. - lateral and median lobe hypertrophy with visual obstruction.  - elevated bladder neck - Bilateral ureteral orifices identified - Bladder mucosa  reveals no ulcers, tumors, or lesions - No bladder stones - moderate trabeculation  Retroflexion shows median lobe   Post-Procedure: - Patient tolerated the procedure well  Assessment/ Plan:  BPH with LUTS on max medical therapy -- he has a bothersome weak stream. He doesn't have bothersome freq or urgency.  He's a good candidate for Urolift, TURP, or  HoLEP of the median lobe and laser ablation of the lateral lobes. I drew him a picture of the anatomy. We went over the nature r/b of alpha blockers, 5ari (including fda warnings) and the above procedures. We discussed with procedures flow symptoms typically improve as well as irritative symptoms although these sometimes persist and rarely worsen. Also discussed small risk of bleeding, infection, ED, stricutre and incontinence among others. He'll give it some thought and would like to do some research.

## 2018-02-28 LAB — MICROSCOPIC EXAMINATION

## 2018-02-28 LAB — URINALYSIS, COMPLETE
Bilirubin, UA: NEGATIVE
Glucose, UA: NEGATIVE
KETONES UA: NEGATIVE
LEUKOCYTES UA: NEGATIVE
NITRITE UA: NEGATIVE
Protein, UA: NEGATIVE
Specific Gravity, UA: 1.02 (ref 1.005–1.030)
Urobilinogen, Ur: 0.2 mg/dL (ref 0.2–1.0)
pH, UA: 6 (ref 5.0–7.5)

## 2018-06-13 ENCOUNTER — Inpatient Hospital Stay: Payer: Medicare Other | Attending: Internal Medicine

## 2018-06-13 ENCOUNTER — Encounter: Payer: Self-pay | Admitting: Internal Medicine

## 2018-06-13 ENCOUNTER — Inpatient Hospital Stay: Payer: Medicare Other | Admitting: Internal Medicine

## 2018-06-13 VITALS — BP 165/80 | HR 57 | Temp 97.3°F | Resp 16 | Wt 164.0 lb

## 2018-06-13 DIAGNOSIS — Z79899 Other long term (current) drug therapy: Secondary | ICD-10-CM | POA: Diagnosis not present

## 2018-06-13 DIAGNOSIS — D696 Thrombocytopenia, unspecified: Secondary | ICD-10-CM | POA: Insufficient documentation

## 2018-06-13 DIAGNOSIS — Z87891 Personal history of nicotine dependence: Secondary | ICD-10-CM | POA: Insufficient documentation

## 2018-06-13 LAB — CBC WITH DIFFERENTIAL/PLATELET
Abs Immature Granulocytes: 0.03 10*3/uL (ref 0.00–0.07)
BASOS PCT: 0 %
Basophils Absolute: 0 10*3/uL (ref 0.0–0.1)
EOS PCT: 3 %
Eosinophils Absolute: 0.1 10*3/uL (ref 0.0–0.5)
HCT: 48.4 % (ref 39.0–52.0)
Hemoglobin: 16 g/dL (ref 13.0–17.0)
Immature Granulocytes: 1 %
Lymphocytes Relative: 16 %
Lymphs Abs: 0.8 10*3/uL (ref 0.7–4.0)
MCH: 31 pg (ref 26.0–34.0)
MCHC: 33.1 g/dL (ref 30.0–36.0)
MCV: 93.8 fL (ref 80.0–100.0)
MONO ABS: 0.5 10*3/uL (ref 0.1–1.0)
Monocytes Relative: 9 %
Neutro Abs: 3.6 10*3/uL (ref 1.7–7.7)
Neutrophils Relative %: 71 %
PLATELETS: 108 10*3/uL — AB (ref 150–400)
RBC: 5.16 MIL/uL (ref 4.22–5.81)
RDW: 13 % (ref 11.5–15.5)
WBC: 5.1 10*3/uL (ref 4.0–10.5)
nRBC: 0 % (ref 0.0–0.2)

## 2018-06-13 LAB — COMPREHENSIVE METABOLIC PANEL
ALBUMIN: 4.3 g/dL (ref 3.5–5.0)
ALT: 23 U/L (ref 0–44)
AST: 32 U/L (ref 15–41)
Alkaline Phosphatase: 53 U/L (ref 38–126)
Anion gap: 6 (ref 5–15)
BILIRUBIN TOTAL: 1 mg/dL (ref 0.3–1.2)
BUN: 26 mg/dL — AB (ref 8–23)
CALCIUM: 9.1 mg/dL (ref 8.9–10.3)
CO2: 30 mmol/L (ref 22–32)
CREATININE: 1 mg/dL (ref 0.61–1.24)
Chloride: 104 mmol/L (ref 98–111)
GFR calc Af Amer: >60 mL/min (ref >60)
Glucose, Bld: 131 mg/dL — ABNORMAL HIGH (ref 70–99)
Potassium: 4.3 mmol/L (ref 3.5–5.1)
Sodium: 140 mmol/L (ref 135–145)
TOTAL PROTEIN: 6.7 g/dL (ref 6.5–8.1)

## 2018-06-13 NOTE — Progress Notes (Signed)
[place] NOTE  Patient Care Team: [name], MD as PCP - General (Family Medicine) Rockey Situ Kathlene November, MD as Consulting Physician (Cardiology)  CHIEF COMPLAINTS/PURPOSE OF CONSULTATION:   # 2013- CHRONIC ISOLATED THROMBOCYTOPENIA- 130-99; May 2017- 115 [place]- 2015- MILD splenomegaly; liver Elastography- Dr.Skulskie-N/ no cirrhosis; hepatitis B/C- NEG; Monoclonal work up-NEG  # On testosterone supplementation; avid cylcist [5000-7000 miles/year]; alcohol  HISTORY OF PRESENTING ILLNESS:  [name] 84 y.o.  male here for follow-up because of his thrombocytopenia.   Patient is a avid cyclist; he backed about 500 miles in the last month or so.  In general his appetite is good.  No weight loss.  Denies any easy bruising or bleeding.  Continues to have few cocktails each night.  Review of Systems  Constitutional: Negative for chills, diaphoresis, fever, malaise/fatigue and weight loss.  HENT: Negative for nosebleeds and sore throat.   Eyes: Negative for double vision.  Respiratory: Negative for cough, hemoptysis, sputum production, shortness of breath and wheezing.   Cardiovascular: Negative for chest pain, palpitations, orthopnea and leg swelling.  Gastrointestinal: Negative for abdominal pain, blood in stool, constipation, diarrhea, heartburn, melena, nausea and vomiting.  Genitourinary: Negative for dysuria, frequency and urgency.  Musculoskeletal: Negative for back pain and joint pain.  Skin: Negative.  Negative for itching and rash.  Neurological: Negative for dizziness, tingling, focal weakness, weakness and headaches.  Endo/Heme/Allergies: Does not bruise/bleed easily.  Psychiatric/Behavioral: Negative for depression. The patient is not nervous/anxious and does not have insomnia.      MEDICAL HISTORY:  Past Medical History:  Diagnosis Date  . Adenomatous polyp 2012  . Allergic rhinitis   . Arrhythmia   . Barrett esophagus 08/12/12, 12/26/10,  06/21/08, 04/20/06, 04/11/03, 01/05/03  . Cancer ([place])    melanoma  . Cardiac arrhythmia   . Cataract   . Chicken pox   . Colon polyp 12/29/10, 04/18/06, 01/05/03  . Diplopia   . Essential hypertension   . Exotropia, left eye   . GERD (gastroesophageal reflux disease)   . Gout   . H/O: CVA (cerebrovascular accident)   . Hemorrhoid   . Hepatic cyst   . Low testosterone   . Melanoma in situ ([place])    left ear, right cheek  . Migraine   . Near syncope   . Nephrolithiasis   . Nephrolithiasis   . Occlusion and stenosis of vertebral artery   . Psoriasis   . Reflux   . Splenomegaly   . Strabismus   . Stroke ([place])   . Subclavian steal syndrome   . Syncope and collapse   . Thrombocytopenia ([place])   . Ventricular premature depolarization   . Vertebral artery stenosis     SURGICAL HISTORY: Past Surgical History:  Procedure Laterality Date  . APPENDECTOMY    . COLONOSCOPY    . COLONOSCOPY WITH PROPOFOL N/A 03/13/2016   Procedure: COLONOSCOPY WITH PROPOFOL;  Surgeon: Lollie Sails, MD;  Location: [facility] ENDOSCOPY;  Service: Endoscopy;  Laterality: N/A;  . diplopia    . ESOPHAGOGASTRODUODENOSCOPY  08/2014, 06/14/2012  . ESOPHAGOGASTRODUODENOSCOPY (EGD) WITH PROPOFOL N/A 03/13/2016   Procedure: ESOPHAGOGASTRODUODENOSCOPY (EGD) WITH PROPOFOL;  Surgeon: Lollie Sails, MD;  Location: [facility] ENDOSCOPY;  Service: Endoscopy;  Laterality: N/A;  . EYE SURGERY  08/16/2003   eye sug  . MOHS SURGERY  2011, 2012   left ear and right cheek  . STRABISMUS SURGERY  10/20/2015   2 horizontal muscles-left    SOCIAL HISTORY: [place]; no  smoking; 4-5 cocktails a week; retired from [employer].  Social History   Socioeconomic History  . Marital status: Married    Spouse name: Not on file  . Number of children: Not on file  . Years of education: Not on file  . Highest education level: Not on file  Occupational History  . Not on file  Social Needs  . Financial resource strain: Not on file   . Food insecurity:    Worry: Not on file    Inability: Not on file  . Transportation needs:    Medical: Not on file    Non-medical: Not on file  Tobacco Use  . Smoking status: Former Smoker    Packs/day: 0.25    Years: 10.00    Pack years: 2.50    Types: Cigarettes    Last attempt to quit: 01/30/1961    Years since quitting: 57.4  . Smokeless tobacco: Never Used  Substance and Sexual Activity  . Alcohol use: Yes    Alcohol/week: 0.0 standard drinks    Comment: social drinker  . Drug use: No  . Sexual activity: Yes  Lifestyle  . Physical activity:    Days per week: Not on file    Minutes per session: Not on file  . Stress: Not on file  Relationships  . Social connections:    Talks on phone: Not on file    Gets together: Not on file    Attends religious service: Not on file    Active member of club or organization: Not on file    Attends meetings of clubs or organizations: Not on file    Relationship status: Not on file  . Intimate partner violence:    Fear of current or ex partner: Not on file    Emotionally abused: Not on file    Physically abused: Not on file    Forced sexual activity: Not on file  Other Topics Concern  . Not on file  Social History Narrative  . Not on file    FAMILY HISTORY: Family History  Problem Relation Age of Onset  . Heart failure Mother   . Osteoporosis Mother   . Stroke Mother   . Heart attack Father   . Gout Father   . Diabetes Mellitus II Father   . Renal Disease Father   . Alcohol abuse Father   . Osteoporosis Maternal Grandfather   . Osteoporosis Maternal Grandmother     ALLERGIES:  is allergic to other.  MEDICATIONS:  Current Outpatient Medications  Medication Sig Dispense Refill  . augmented betamethasone dipropionate (DIPROLENE-AF) 0.05 % ointment     . calcium-vitamin D (OSCAL WITH D) 500-200 MG-UNIT tablet Take 1 tablet by mouth daily with breakfast.    . desonide (DESOWEN) 0.05 % cream     . meclizine (ANTIVERT)  25 MG tablet Take 1 tablet (25 mg total) by mouth 3 (three) times daily as needed. 60 tablet 3  . meloxicam (MOBIC) 15 MG tablet Take 1 tablet (15 mg total) daily by mouth. 30 tablet 3  . Multiple Vitamin (MULTI-VITAMINS) TABS Take by mouth daily.     Marland Kitchen omeprazole (PRILOSEC) 20 MG capsule TAKE 1 CAPSULE BY MOUTH ONCE DAILY 1 HOUR BEFORE A MEAL    . tacrolimus (PROTOPIC) 0.1 % ointment     . tamsulosin (FLOMAX) 0.4 MG CAPS capsule TAKE 1 CAPSULE BY MOUTH DAILY 30 capsule 9  . Testosterone 20.25 MG/ACT (1.62%) GEL Apply to shoulders and/or upper arms only 5  grams    . valACYclovir (VALTREX) 1000 MG tablet Take 500 mg by mouth daily.      No current facility-administered medications for this visit.       Marland Kitchen  PHYSICAL EXAMINATION:   Vitals:   06/13/18 1441  BP: (!) 165/80  Pulse: (!) 57  Resp: 16  Temp: (!) 97.3 F (36.3 C)   Filed Weights   06/13/18 1441  Weight: 164 lb (74.4 kg)    Physical Exam  Constitutional: He is oriented to person, place, and time and well-developed, well-nourished, and in no distress.  HENT:  Head: Normocephalic and atraumatic.  Mouth/Throat: Oropharynx is clear and moist. No oropharyngeal exudate.  Eyes: Pupils are equal, round, and reactive to light.  Neck: Normal range of motion. Neck supple.  Cardiovascular: Normal rate and regular rhythm.  Pulmonary/Chest: No respiratory distress. He has no wheezes.  Abdominal: Soft. Bowel sounds are normal. He exhibits no distension and no mass. There is no tenderness. There is no rebound and no guarding.  Musculoskeletal: Normal range of motion. He exhibits no edema or tenderness.  Neurological: He is alert and oriented to person, place, and time.  Skin: Skin is warm.  Psychiatric: Affect normal.     LABORATORY DATA:  I have reviewed the data as listed Lab Results  Component Value Date   WBC 5.1 06/13/2018   HGB 16.0 06/13/2018   HCT 48.4 06/13/2018   MCV 93.8 06/13/2018   PLT 108 (L) 06/13/2018    Recent Labs    06/14/17 1420 06/13/18 1417  NA 141 140  K 3.7 4.3  CL 104 104  CO2 28 30  GLUCOSE 121* 131*  BUN 21* 26*  CREATININE 1.31* 1.00  CALCIUM 9.1 9.1  GFRNONAA 49* >60  GFRAA 57* >60  PROT 7.0 6.7  ALBUMIN 4.3 4.3  AST 34 32  ALT 17 23  ALKPHOS 51 53  BILITOT 1.1 1.0     ASSESSMENT & PLAN:   Thrombocytopenia (HCC) # Chronic thrombocytopenia isolated- > 100 [since 2015]-  ITP [most likely cause] vs? Alcohol use Vs  Vs mild splenomegaly vs versus primary bone marrow process like myelodysplastic syndrome  [less likley] . Today platelets 107;  monitor for now. STABLE.   # educated re: bleeding. Other wise monitor now.   # DISPOSITION:  follow up in 12 months/labs  Cc; Dr.Bronstein    Cammie Sickle, MD 06/13/2018 4:16 PM

## 2018-06-13 NOTE — Assessment & Plan Note (Addendum)
#   Chronic thrombocytopenia isolated- > 100 [since 2015]-  ITP [most likely cause] vs? Alcohol use Vs  Vs mild splenomegaly vs versus primary bone marrow process like myelodysplastic syndrome  [less likley] . Today platelets 107;  monitor for now. STABLE.   # educated re: bleeding. Other wise monitor now.   # DISPOSITION:  follow up in 12 months/labs  Cc; Dr.Bronstein

## 2018-07-14 ENCOUNTER — Encounter: Payer: Self-pay | Admitting: *Deleted

## 2018-07-15 ENCOUNTER — Encounter
Admission: RE | Disposition: A | Discharge status: Home / Self Care | Payer: Self-pay | Source: Ambulatory Visit | Attending: Gastroenterology

## 2018-07-15 ENCOUNTER — Ambulatory Visit: Payer: Medicare Other | Admitting: Anesthesiology

## 2018-07-15 ENCOUNTER — Other Ambulatory Visit: Payer: Self-pay

## 2018-07-15 ENCOUNTER — Encounter: Payer: Self-pay | Admitting: *Deleted

## 2018-07-15 ENCOUNTER — Ambulatory Visit
Admission: RE | Admit: 2018-07-15 | Discharge: 2018-07-15 | Disposition: A | Discharge status: Home / Self Care | Payer: Medicare Other | Source: Ambulatory Visit | Attending: Gastroenterology | Admitting: Gastroenterology

## 2018-07-15 DIAGNOSIS — K210 Gastro-esophageal reflux disease with esophagitis: Secondary | ICD-10-CM | POA: Insufficient documentation

## 2018-07-15 DIAGNOSIS — Z87891 Personal history of nicotine dependence: Secondary | ICD-10-CM | POA: Insufficient documentation

## 2018-07-15 DIAGNOSIS — K319 Disease of stomach and duodenum, unspecified: Secondary | ICD-10-CM | POA: Insufficient documentation

## 2018-07-15 DIAGNOSIS — K222 Esophageal obstruction: Secondary | ICD-10-CM | POA: Insufficient documentation

## 2018-07-15 DIAGNOSIS — K219 Gastro-esophageal reflux disease without esophagitis: Secondary | ICD-10-CM | POA: Diagnosis not present

## 2018-07-15 DIAGNOSIS — K2970 Gastritis, unspecified, without bleeding: Secondary | ICD-10-CM | POA: Diagnosis not present

## 2018-07-15 DIAGNOSIS — K2270 Barrett's esophagus without dysplasia: Secondary | ICD-10-CM | POA: Insufficient documentation

## 2018-07-15 DIAGNOSIS — Z791 Long term (current) use of non-steroidal anti-inflammatories (NSAID): Secondary | ICD-10-CM | POA: Diagnosis not present

## 2018-07-15 DIAGNOSIS — Z8673 Personal history of transient ischemic attack (TIA), and cerebral infarction without residual deficits: Secondary | ICD-10-CM | POA: Insufficient documentation

## 2018-07-15 DIAGNOSIS — Z85820 Personal history of malignant melanoma of skin: Secondary | ICD-10-CM | POA: Insufficient documentation

## 2018-07-15 DIAGNOSIS — Z79899 Other long term (current) drug therapy: Secondary | ICD-10-CM | POA: Insufficient documentation

## 2018-07-15 DIAGNOSIS — Z09 Encounter for follow-up examination after completed treatment for conditions other than malignant neoplasm: Secondary | ICD-10-CM | POA: Insufficient documentation

## 2018-07-15 DIAGNOSIS — I10 Essential (primary) hypertension: Secondary | ICD-10-CM | POA: Insufficient documentation

## 2018-07-15 HISTORY — PX: ESOPHAGOGASTRODUODENOSCOPY (EGD) WITH PROPOFOL: SHX5813

## 2018-07-15 HISTORY — DX: Cardiac arrhythmia, unspecified: I49.9

## 2018-07-15 HISTORY — DX: Personal history of urinary calculi: Z87.442

## 2018-07-15 LAB — CBC WITH DIFFERENTIAL/PLATELET
Abs Immature Granulocytes: 0.02 10*3/uL (ref 0.00–0.07)
BASOS ABS: 0 10*3/uL (ref 0.0–0.1)
BASOS PCT: 1 %
EOS PCT: 2 %
Eosinophils Absolute: 0.1 10*3/uL (ref 0.0–0.5)
HCT: 50.5 % (ref 39.0–52.0)
Hemoglobin: 16.8 g/dL (ref 13.0–17.0)
Immature Granulocytes: 1 %
Lymphocytes Relative: 20 %
Lymphs Abs: 0.8 10*3/uL (ref 0.7–4.0)
MCH: 31.3 pg (ref 26.0–34.0)
MCHC: 33.3 g/dL (ref 30.0–36.0)
MCV: 94 fL (ref 80.0–100.0)
Monocytes Absolute: 0.3 10*3/uL (ref 0.1–1.0)
Monocytes Relative: 7 %
NRBC: 0 % (ref 0.0–0.2)
Neutro Abs: 3 10*3/uL (ref 1.7–7.7)
Neutrophils Relative %: 69 %
Platelets: 107 10*3/uL — ABNORMAL LOW (ref 150–400)
RBC: 5.37 MIL/uL (ref 4.22–5.81)
RDW: 12.9 % (ref 11.5–15.5)
WBC: 4.2 10*3/uL (ref 4.0–10.5)

## 2018-07-15 LAB — PROTIME-INR
INR: 1.02
PROTHROMBIN TIME: 13.3 s (ref 11.4–15.2)

## 2018-07-15 SURGERY — ESOPHAGOGASTRODUODENOSCOPY (EGD) WITH PROPOFOL
Anesthesia: General

## 2018-07-15 MED ORDER — SODIUM CHLORIDE 0.9 % IV SOLN: INTRAVENOUS | Status: DC

## 2018-07-15 MED ORDER — PROPOFOL 500 MG/50ML IV EMUL
INTRAVENOUS | Status: DC | PRN
  Administered 2018-07-15: 140 ug/kg/min via INTRAVENOUS

## 2018-07-15 MED ORDER — PROPOFOL 500 MG/50ML IV EMUL
INTRAVENOUS | Status: AC
  Filled 2018-07-15: qty 50

## 2018-07-15 MED ORDER — PROPOFOL 10 MG/ML IV BOLUS
INTRAVENOUS | Status: DC | PRN
  Administered 2018-07-15 (×3): 36.1 mg via INTRAVENOUS

## 2018-07-15 MED ORDER — PROPOFOL 10 MG/ML IV BOLUS
INTRAVENOUS | Status: AC
  Filled 2018-07-15: qty 20

## 2018-07-15 MED ORDER — LIDOCAINE HCL (PF) 2 % IJ SOLN
INTRAMUSCULAR | Status: AC
  Filled 2018-07-15: qty 10

## 2018-07-15 MED ORDER — SODIUM CHLORIDE 0.9 % IV SOLN
INTRAVENOUS | Status: DC
  Administered 2018-07-15: 09:00:00 via INTRAVENOUS

## 2018-07-15 NOTE — Anesthesia Postprocedure Evaluation (Signed)
Anesthesia Post Note  Patient: [name]  Procedure(s) Performed: ESOPHAGOGASTRODUODENOSCOPY (EGD) WITH PROPOFOL (N/A )  Patient location during evaluation: Endoscopy Anesthesia Type: General Level of consciousness: awake and alert Pain management: pain level controlled Vital Signs Assessment: post-procedure vital signs reviewed and stable Respiratory status: spontaneous breathing and respiratory function stable Cardiovascular status: stable Anesthetic complications: no     Last Vitals:  Vitals:   07/15/18 0747 07/15/18 0936  BP: (!) 141/81 (!) 100/43  Pulse: 60 (!) 59  Resp: 16 15  Temp: 36.5 C (!) 36.3 C  SpO2: 96% 95%    Last Pain:  Vitals:   07/15/18 0936  TempSrc: Tympanic  PainSc: Asleep                 Tedra Coppernoll K

## 2018-07-15 NOTE — Anesthesia Post-op Follow-up Note (Signed)
Anesthesia QCDR form completed.        

## 2018-07-15 NOTE — Transfer of Care (Signed)
Immediate Anesthesia Transfer of Care Note  Patient: [name]  Procedure(s) Performed: ESOPHAGOGASTRODUODENOSCOPY (EGD) WITH PROPOFOL (N/A )  Patient Location: PACU and Endoscopy Unit  Anesthesia Type:General  Level of Consciousness: sedated  Airway & Oxygen Therapy: Patient Spontanous Breathing and Patient connected to nasal cannula oxygen  Post-op Assessment: Report given to RN  Post vital signs: stable  Last Vitals:  Vitals Value Taken Time  BP 100/43 07/15/2018  9:38 AM  Temp 36.3 C 07/15/2018  9:36 AM  Pulse 57 07/15/2018  9:38 AM  Resp 17 07/15/2018  9:38 AM  SpO2 97 % 07/15/2018  9:38 AM  Vitals shown include unvalidated device data.  Last Pain:  Vitals:   07/15/18 0936  TempSrc: Tympanic  PainSc: Asleep         Complications: No apparent anesthesia complications

## 2018-07-15 NOTE — Anesthesia Preprocedure Evaluation (Signed)
Anesthesia Evaluation  Patient identified by MRN, date of birth, ID band Patient awake    Reviewed: Allergy & Precautions, NPO status , Patient's Chart, lab work & pertinent test results  History of Anesthesia Complications Negative for: history of anesthetic complications  Airway Mallampati: III       Dental   Pulmonary neg sleep apnea, neg COPD, former smoker,           Cardiovascular (-) hypertension(-) Past MI and (-) CHF (-) dysrhythmias + Valvular Problems/Murmurs (murmur, no tx)      Neuro/Psych neg Seizures    GI/Hepatic Neg liver ROS, GERD  Medicated,  Endo/Other  neg diabetes  Renal/GU Renal disease (stones)     Musculoskeletal   Abdominal   Peds  Hematology   Anesthesia Other Findings   Reproductive/Obstetrics                             Anesthesia Physical Anesthesia Plan  ASA: II  Anesthesia Plan: General   Post-op Pain Management:    Induction: Intravenous  PONV Risk Score and Plan: 2 and Propofol infusion and TIVA  Airway Management Planned: Nasal Cannula  Additional Equipment:   Intra-op Plan:   Post-operative Plan:   Informed Consent: I have reviewed the patients History and Physical, chart, labs and discussed the procedure including the risks, benefits and alternatives for the proposed anesthesia with the patient or authorized representative who has indicated his/her understanding and acceptance.     Plan Discussed with:   Anesthesia Plan Comments:         Anesthesia Quick Evaluation

## 2018-07-15 NOTE — Op Note (Signed)
[facility] Gastroenterology Patient Name: [name] Procedure Date: 07/15/2018 9:11 AM MRN: [id number] Account #: [id number] Date of Birth: 03-21-1934 Admit Type: Outpatient Age: 84 Room: [facility] ENDO ROOM 3 Gender: Male Note Status: Finalized Procedure:            Upper GI endoscopy Indications:          Follow-up of Barrett's esophagus Providers:            Lollie Sails, MD Referring MD:         Youlanda Roys. Lovie Macadamia, MD (Referring MD) Medicines:            Monitored Anesthesia Care Complications:        No immediate complications. Procedure:            Pre-Anesthesia Assessment:                       - ASA Grade Assessment: II - A patient with mild                        systemic disease.                       After obtaining informed consent, the endoscope was                        passed under direct vision. Throughout the procedure,                        the patient's blood pressure, pulse, and oxygen                        saturations were monitored continuously. The Endoscope                        was introduced through the mouth, and advanced to the                        third part of duodenum. The upper GI endoscopy was                        accomplished without difficulty. The patient tolerated                        the procedure well. Findings:      A non-obstructing Schatzki ring was found at the gastroesophageal       junction.      There were esophageal mucosal changes secondary to established       short-segment Barrett's disease present in the distal esophagus. The       maximum longitudinal extent of these mucosal changes was 0.7 cm in       length. Mucosa was biopsied with a cold forceps for histology in 4       quadrants. One specimen bottle was sent to pathology. Of note the ring       also opened with the biopsy.      The exam of the esophagus was otherwise normal.      Patchy minimal inflammation characterized by erythema was  found in the       gastric body. Biopsies were taken with a cold forceps for histology.      The cardia  and gastric fundus were normal on retroflexion.      The examined duodenum was normal. Impression:           - Non-obstructing Schatzki ring.                       - Esophageal mucosal changes secondary to established                        short-segment Barrett's disease. Biopsied.                       - Gastritis. Biopsied.                       - Normal examined duodenum. Recommendation:       - Discharge patient to home.                       - Continue present medications.                       - Telephone GI clinic for pathology results in 1 week. Procedure Code(s):    --- Professional ---                       [phone], Esophagogastroduodenoscopy, flexible, transoral;                        with biopsy, single or multiple Diagnosis Code(s):    --- Professional ---                       K22.2, Esophageal obstruction                       K22.70, Barrett's esophagus without dysplasia                       K29.70, Gastritis, unspecified, without bleeding CPT copyright 2018 American Medical Association. All rights reserved. The codes documented in this report are preliminary and upon coder review may  be revised to meet current compliance requirements. Lollie Sails, MD 07/15/2018 9:37:12 AM This report has been signed electronically. Number of Addenda: 0 Note Initiated On: 07/15/2018 9:11 AM      [facility]

## 2018-07-16 ENCOUNTER — Encounter: Payer: Self-pay | Admitting: Gastroenterology

## 2018-07-16 LAB — SURGICAL PATHOLOGY

## 2018-07-23 NOTE — H&P (Signed)
Outpatient short stay form Pre-procedure 07/23/2018 4:40 PM Lollie Sails MD  Primary Physician: Dr. [name]  Reason for visit: EGD  History of present illness: Patient is a 84 year old male presenting for follow-up EGD in regards to his history of Barrett's esophagus.  His last EGD was in July 2017 this showing consistent with Barrett's esophagus.  He has continued on a proton pump inhibitor, Prilosec 20 mg daily, no difficulties with heartburn or dysphagia.  No aspirin or blood thinning agent.   No current facility-administered medications for this encounter.   Current Outpatient Medications:  .  CALCIUM ASCORBATE PO, Take 1 tablet by mouth daily., Disp: , Rfl:  .  calcium-vitamin D (OSCAL WITH D) 500-200 MG-UNIT tablet, Take 1 tablet by mouth daily with breakfast., Disp: , Rfl:  .  desonide (DESOWEN) 0.05 % cream, , Disp: , Rfl:  .  meloxicam (MOBIC) 15 MG tablet, Take 1 tablet (15 mg total) daily by mouth., Disp: 30 tablet, Rfl: 3 .  Multiple Vitamin (MULTI-VITAMINS) TABS, Take by mouth daily. , Disp: , Rfl:  .  omeprazole (PRILOSEC) 20 MG capsule, TAKE 1 CAPSULE BY MOUTH ONCE DAILY 1 HOUR BEFORE A MEAL, Disp: , Rfl:  .  tacrolimus (PROTOPIC) 0.1 % ointment, , Disp: , Rfl:  .  tamsulosin (FLOMAX) 0.4 MG CAPS capsule, TAKE 1 CAPSULE BY MOUTH DAILY, Disp: 30 capsule, Rfl: 9 .  Testosterone 20.25 MG/ACT (1.62%) GEL, Apply to shoulders and/or upper arms only 5 grams, Disp: , Rfl:  .  augmented betamethasone dipropionate (DIPROLENE-AF) 0.05 % ointment, , Disp: , Rfl:  .  meclizine (ANTIVERT) 25 MG tablet, Take 1 tablet (25 mg total) by mouth 3 (three) times daily as needed. (Patient not taking: Reported on 07/15/2018), Disp: 60 tablet, Rfl: 3 .  valACYclovir (VALTREX) 1000 MG tablet, Take 500 mg by mouth daily. , Disp: , Rfl:   No medications prior to admission.     Allergies  Allergen Reactions  . Other Diarrhea    Other reaction(s): Abdominal Pain tarragon Other  reaction(s): Abdominal Pain tarragon     Past Medical History:  Diagnosis Date  . Adenomatous polyp 2012  . Allergic rhinitis   . Arrhythmia   . 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 Cancer ([place])    melanoma  . Cardiac arrhythmia   . Cataract   . Chicken pox   . Colon polyp 12/29/10, 04/18/06, 01/05/03  . Diplopia   . Dysrhythmia   . Essential hypertension   . Exotropia, left eye   . GERD (gastroesophageal reflux disease)   . Gout   . H/O: CVA (cerebrovascular accident)   . Hemorrhoid   . Hepatic cyst   . History of kidney stones   . Low testosterone   . Melanoma in situ ([place])    left ear, right cheek  . Migraine   . Near syncope   . Nephrolithiasis   . Nephrolithiasis   . Occlusion and stenosis of vertebral artery   . Psoriasis   . Reflux   . Splenomegaly   . Strabismus   . Subclavian steal syndrome   . Syncope and collapse   . Thrombocytopenia ([place])   . Ventricular premature depolarization   . Vertebral artery stenosis     Review of systems:      Physical Exam    Heart and lungs: Rhythm without rub or gallop, lungs are clear    HEENT: Normocephalic atraumatic eyes are anicteric    Other:  Pertinant exam for procedure: Soft nontender nondistended bowel sounds positive normoactive    Planned proceedures: EGD and indicated procedures. I have discussed the risks benefits and complications of procedures to include not limited to bleeding, infection, perforation and the risk of sedation and the patient wishes to proceed.    Lollie Sails, MD Gastroenterology 07/23/2018  4:40 PM

## 2018-09-10 ENCOUNTER — Ambulatory Visit: Payer: Medicare Other | Admitting: Urology

## 2018-09-25 ENCOUNTER — Ambulatory Visit: Payer: Medicare Other | Admitting: Urology

## 2018-09-28 NOTE — Progress Notes (Signed)
09/29/2018 2:28 PM   [name] 06/29/1934 [id number]  Referring provider: [name], MD [phone] [place], [place] [address]  Chief Complaint  Patient presents with  . Benign Prostatic Hypertrophy    HPI: [name] is a 85 y.o. male with BPH with LUTS who presents today for a six month follow up.  He remainson tamsulosin and finasteride; he was as of his cytoscopy on 02/2018 on max medical therapy but still reported a bothersome weak stream though not bothersome frequency or urgency.  Dr. Junious Silk discussed the possibility of Urolift, TURP, or HoLEP of the median lobe and laser ablation of the lateral lobes; patient did not decide to go forward with any of these options at that time.  Patient today (09/29/2018) says symptoms have stayed stable since his last visit, and he is going to wait until they worsen before pursuing surgical options.  Patient reports today that he feels his quality of life due to urinary symptoms is mixed and he nearly always has a weak stream, with less than 1 in 5 times frequency and intermittancy and nocturia 1 x.   PMH: Past Medical History:  Diagnosis Date  . Adenomatous polyp 2012  . Allergic rhinitis   . Arrhythmia   . 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 Cancer ([place])    melanoma  . Cardiac arrhythmia   . Cataract   . Chicken pox   . Colon polyp 12/29/10, 04/18/06, 01/05/03  . Diplopia   . Dysrhythmia   . Essential hypertension   . Exotropia, left eye   . GERD (gastroesophageal reflux disease)   . Gout   . H/O: CVA (cerebrovascular accident)   . Hemorrhoid   . Hepatic cyst   . History of kidney stones   . Low testosterone   . Melanoma in situ ([place])    left ear, right cheek  . Migraine   . Near syncope   . Nephrolithiasis   . Nephrolithiasis   . Occlusion and stenosis of vertebral artery   . Psoriasis   . Reflux   . Splenomegaly   . Strabismus   . Subclavian steal syndrome   .  Syncope and collapse   . Thrombocytopenia ([place])   . Ventricular premature depolarization   . Vertebral artery stenosis     Surgical History: Past Surgical History:  Procedure Laterality Date  . APPENDECTOMY    . COLONOSCOPY    . COLONOSCOPY WITH PROPOFOL N/A 03/13/2016   Procedure: COLONOSCOPY WITH PROPOFOL;  Surgeon: Kenneth Sails, MD;  Location: [facility] ENDOSCOPY;  Service: Endoscopy;  Laterality: N/A;  . diplopia    . ESOPHAGOGASTRODUODENOSCOPY  08/2014, 06/14/2012  . ESOPHAGOGASTRODUODENOSCOPY (EGD) WITH PROPOFOL N/A 03/13/2016   Procedure: ESOPHAGOGASTRODUODENOSCOPY (EGD) WITH PROPOFOL;  Surgeon: Kenneth Sails, MD;  Location: [facility] ENDOSCOPY;  Service: Endoscopy;  Laterality: N/A;  . ESOPHAGOGASTRODUODENOSCOPY (EGD) WITH PROPOFOL N/A 07/15/2018   Procedure: ESOPHAGOGASTRODUODENOSCOPY (EGD) WITH PROPOFOL;  Surgeon: Kenneth Sails, MD;  Location[facility] ENDOSCOPY;  Service: Endoscopy;  Laterality: N/A;  . EYE SURGERY  08/16/2003   eye sug  . MOHS SURGERY  2011, 2012   left ear and right cheek  . STRABISMUS SURGERY  10/20/2015   2 horizontal muscles-left  . TONSILLECTOMY    . WISDOM TOOTH EXTRACTION      Home Medications:  Allergies as of 09/29/2018      Reactions   Other Diarrhea   Other reaction(s): Abdominal Pain tarragon Other reaction(s): Abdominal  Pain tarragon      Medication List       Accurate as of September 29, 2018  2:28 PM. Always use your most recent med list.        augmented betamethasone dipropionate 0.05 % ointment Commonly known as:  DIPROLENE-AF   CALCIUM ASCORBATE PO Take 1 tablet by mouth daily.   calcium-vitamin D 500-200 MG-UNIT tablet Commonly known as:  OSCAL WITH D Take 1 tablet by mouth daily with breakfast.   desonide 0.05 % cream Commonly known as:  DESOWEN   meclizine 25 MG tablet Commonly known as:  ANTIVERT Take 1 tablet (25 mg total) by mouth 3 (three) times daily as needed.   meloxicam 15 MG tablet Commonly known as:   MOBIC Take 1 tablet (15 mg total) daily by mouth.   MULTI-VITAMINS Tabs Take by mouth daily.   omeprazole 20 MG capsule Commonly known as:  PRILOSEC TAKE 1 CAPSULE BY MOUTH ONCE DAILY 1 HOUR BEFORE A MEAL   tacrolimus 0.1 % ointment Commonly known as:  PROTOPIC   tamsulosin 0.4 MG Caps capsule Commonly known as:  FLOMAX TAKE 1 CAPSULE BY MOUTH DAILY   Testosterone 20.25 MG/ACT (1.62%) Gel Apply to shoulders and/or upper arms only 5 grams   valACYclovir 1000 MG tablet Commonly known as:  VALTREX Take 500 mg by mouth daily.       Allergies:  Allergies  Allergen Reactions  . Other Diarrhea    Other reaction(s): Abdominal Pain tarragon Other reaction(s): Abdominal Pain tarragon    Family History: Family History  Problem Relation Age of Onset  . Heart failure Mother   . Osteoporosis Mother   . Stroke Mother   . Heart attack Father   . Gout Father   . Diabetes Mellitus II Father   . Renal Disease Father   . Alcohol abuse Father   . Osteoporosis Maternal Grandfather   . Osteoporosis Maternal Grandmother     Social History:  reports that he quit smoking about 57 years ago. His smoking use included cigarettes. He has a 2.50 pack-year smoking history. He has never used smokeless tobacco. He reports current alcohol use. He reports that he does not use drugs.  ROS: UROLOGY Frequent Urination?: No Hard to postpone urination?: No Burning/pain with urination?: No Get up at night to urinate?: No Leakage of urine?: No Urine stream starts and stops?: No Trouble starting stream?: No Do you have to strain to urinate?: No Blood in urine?: No Urinary tract infection?: No Sexually transmitted disease?: No Injury to kidneys or bladder?: No Painful intercourse?: No Weak stream?: Yes Erection problems?: No Penile pain?: No  Gastrointestinal Nausea?: No Vomiting?: No Indigestion/heartburn?: No Diarrhea?: No Constipation?: No  Constitutional Fever: No Night  sweats?: No Weight loss?: No Fatigue?: No  Skin Skin rash/lesions?: No Itching?: No  Eyes Blurred vision?: No Double vision?: No  Ears/Nose/Throat Sore throat?: No Sinus problems?: No  Hematologic/Lymphatic Swollen glands?: No Easy bruising?: No  Cardiovascular Leg swelling?: No Chest pain?: No  Respiratory Cough?: No Shortness of breath?: No  Endocrine Excessive thirst?: No  Musculoskeletal Back pain?: Yes Joint pain?: No  Neurological Headaches?: No Dizziness?: No  Psychologic Depression?: No Anxiety?: No  Physical Exam: BP (!) 142/74 (BP Location: Left Arm, Patient Position: Sitting, Cuff Size: Normal)   Pulse 80   Ht 6' (1.829 m)   Wt 169 lb 9.6 oz (76.9 kg)   BMI 23.00 kg/m   Constitutional:  Well nourished. Alert and oriented, No acute  distress. Cardiovascular: No clubbing, cyanosis, or edema. Respiratory: Normal respiratory effort, no increased work of breathing. Skin: No rashes, bruises or suspicious lesions. Neurologic: Grossly intact, no focal deficits, moving all 4 extremities. Psychiatric: Normal mood and affect.  Laboratory Data: Lab Results  Component Value Date   WBC 4.2 07/15/2018   HGB 16.8 07/15/2018   HCT 50.5 07/15/2018   MCV 94.0 07/15/2018   PLT 107 (L) 07/15/2018    Lab Results  Component Value Date   CREATININE 1.00 06/13/2018    Assessment & Plan:    1. BPH with LUTS - As long as symptoms remain stable, patient elects to not pursue any outlet procedures and continue medical management - Patient will return in 1 year for follow up, unless symptoms worsen  Return in about 1 year (around 09/30/2019) for Follow-up.  Abbie Sons, [place] [address] [place], [place] [address] [phone]  I, Adele Schilder, am acting as a scribe for John Giovanni, MD.    I, Abbie Sons, MD, have reviewed all documentation for this visit. The documentation on 09/29/18  for the exam, diagnosis, procedures, and orders are all accurate and complete.

## 2018-09-29 ENCOUNTER — Encounter: Payer: Self-pay | Admitting: Urology

## 2018-09-29 ENCOUNTER — Ambulatory Visit: Payer: Medicare Other | Admitting: Urology

## 2018-09-29 VITALS — BP 142/74 | HR 80 | Ht 72.0 in | Wt 169.6 lb

## 2018-09-29 DIAGNOSIS — N401 Enlarged prostate with lower urinary tract symptoms: Secondary | ICD-10-CM | POA: Diagnosis not present

## 2018-09-29 LAB — BLADDER SCAN AMB NON-IMAGING

## 2018-09-29 NOTE — Patient Instructions (Signed)

## 2018-10-20 ENCOUNTER — Other Ambulatory Visit: Payer: Self-pay | Admitting: Urology

## 2018-10-31 ENCOUNTER — Other Ambulatory Visit: Payer: Self-pay | Admitting: Family Medicine

## 2018-10-31 MED ORDER — FINASTERIDE 5 MG PO TABS: 5.0000 mg | ORAL_TABLET | Freq: Every day | ORAL | 3 refills | Status: DC

## 2019-01-21 ENCOUNTER — Telehealth: Payer: Self-pay

## 2019-01-21 NOTE — Telephone Encounter (Signed)
Called patient from recall.  No answer. LMOV.  This is the second attempt per recall list.

## 2019-01-28 NOTE — Telephone Encounter (Signed)
Called patient from recall.  No answer. LMOV.  This is the third attempt per recall list.  Will delete recall.

## 2019-06-15 ENCOUNTER — Inpatient Hospital Stay: Payer: Medicare Other | Attending: Internal Medicine

## 2019-06-15 ENCOUNTER — Inpatient Hospital Stay: Payer: Medicare Other | Admitting: Internal Medicine

## 2019-06-15 ENCOUNTER — Other Ambulatory Visit: Payer: Self-pay

## 2019-06-15 ENCOUNTER — Encounter: Payer: Self-pay | Admitting: Internal Medicine

## 2019-06-15 DIAGNOSIS — D696 Thrombocytopenia, unspecified: Secondary | ICD-10-CM

## 2019-06-15 DIAGNOSIS — Z87891 Personal history of nicotine dependence: Secondary | ICD-10-CM | POA: Insufficient documentation

## 2019-06-15 DIAGNOSIS — Z791 Long term (current) use of non-steroidal anti-inflammatories (NSAID): Secondary | ICD-10-CM | POA: Insufficient documentation

## 2019-06-15 DIAGNOSIS — Z79899 Other long term (current) drug therapy: Secondary | ICD-10-CM | POA: Insufficient documentation

## 2019-06-15 DIAGNOSIS — I10 Essential (primary) hypertension: Secondary | ICD-10-CM | POA: Diagnosis not present

## 2019-06-15 DIAGNOSIS — Z85820 Personal history of malignant melanoma of skin: Secondary | ICD-10-CM | POA: Insufficient documentation

## 2019-06-15 DIAGNOSIS — Z8673 Personal history of transient ischemic attack (TIA), and cerebral infarction without residual deficits: Secondary | ICD-10-CM | POA: Diagnosis not present

## 2019-06-15 LAB — CBC WITH DIFFERENTIAL/PLATELET
Abs Immature Granulocytes: 0.02 10*3/uL (ref 0.00–0.07)
Basophils Absolute: 0 10*3/uL (ref 0.0–0.1)
Basophils Relative: 1 %
Eosinophils Absolute: 0.1 10*3/uL (ref 0.0–0.5)
Eosinophils Relative: 2 %
HCT: 49.5 % (ref 39.0–52.0)
Hemoglobin: 17 g/dL (ref 13.0–17.0)
Immature Granulocytes: 0 %
Lymphocytes Relative: 25 %
Lymphs Abs: 1.3 10*3/uL (ref 0.7–4.0)
MCH: 31.8 pg (ref 26.0–34.0)
MCHC: 34.3 g/dL (ref 30.0–36.0)
MCV: 92.5 fL (ref 80.0–100.0)
Monocytes Absolute: 0.4 10*3/uL (ref 0.1–1.0)
Monocytes Relative: 8 %
Neutro Abs: 3.2 10*3/uL (ref 1.7–7.7)
Neutrophils Relative %: 64 %
Platelets: 95 10*3/uL — ABNORMAL LOW (ref 150–400)
RBC: 5.35 MIL/uL (ref 4.22–5.81)
RDW: 12.7 % (ref 11.5–15.5)
WBC: 5.1 10*3/uL (ref 4.0–10.5)
nRBC: 0 % (ref 0.0–0.2)

## 2019-06-15 LAB — COMPREHENSIVE METABOLIC PANEL
ALT: 19 U/L (ref 0–44)
AST: 28 U/L (ref 15–41)
Albumin: 4.6 g/dL (ref 3.5–5.0)
Alkaline Phosphatase: 53 U/L (ref 38–126)
Anion gap: 8 (ref 5–15)
BUN: 21 mg/dL (ref 8–23)
CO2: 29 mmol/L (ref 22–32)
Calcium: 9.1 mg/dL (ref 8.9–10.3)
Chloride: 104 mmol/L (ref 98–111)
Creatinine, Ser: 1.13 mg/dL (ref 0.61–1.24)
GFR calc Af Amer: >60 mL/min (ref >60)
GFR calc non Af Amer: 60 mL/min — ABNORMAL LOW (ref >60)
Glucose, Bld: 102 mg/dL — ABNORMAL HIGH (ref 70–99)
Potassium: 4.1 mmol/L (ref 3.5–5.1)
Sodium: 141 mmol/L (ref 135–145)
Total Bilirubin: 1.2 mg/dL (ref 0.3–1.2)
Total Protein: 7.2 g/dL (ref 6.5–8.1)

## 2019-06-15 NOTE — Assessment & Plan Note (Addendum)
#  Chronic thrombocytopenia isolated- > 100 [since 2015]- Today platelets are 95/slightly lower than the baseline of 100,000.  Again discussed the differential ITP [most likely cause] vs? Alcohol use Vs  mild splenomegaly vs versus primary bone marrow process like myelodysplastic syndrome  [less likley] .   # I would not recommend bone marrow biopsy at this time/or any treatment recommendations at this time as patient is asymptomatic.  However since platelets are slightly lower than his baseline/I would recommend follow-up in 6 months than 12 month follow up.   # DISPOSITION:  follow up in 6 months- MD/labs-cbc/cmp/ldh- Dr.B Cc; Dr.Bronstein

## 2019-06-15 NOTE — Progress Notes (Signed)
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  male here for follow-up because of his thrombocytopenia.   Patient appetite is good but no weight loss.  No nausea no vomiting.  No bleeding.  He continues to have 4-6 drinks of liquor each week.  He continues be physically active/avid cyclist.   Review of Systems  Constitutional: Negative for chills, diaphoresis, fever, malaise/fatigue and weight loss.  HENT: Negative for nosebleeds and sore throat.   Eyes: Negative for double vision.  Respiratory: Negative for cough, hemoptysis, sputum production, shortness of breath and wheezing.   Cardiovascular: Negative for chest pain, palpitations, orthopnea and leg swelling.  Gastrointestinal: Negative for abdominal pain, blood in stool, constipation, diarrhea, heartburn, melena, nausea and vomiting.  Genitourinary: Negative for dysuria, frequency and urgency.  Musculoskeletal: Negative for back pain and joint pain.  Skin: Negative.  Negative for itching and rash.  Neurological: Negative for dizziness, tingling, focal weakness, weakness and headaches.  Endo/Heme/Allergies: Does not bruise/bleed easily.  Psychiatric/Behavioral: Negative for depression. The patient is not nervous/anxious and does not have insomnia.      MEDICAL HISTORY:  Past Medical History:  Diagnosis Date  . Adenomatous polyp 2012  . Allergic rhinitis   . Arrhythmia   . Barrett esophagus 08/12/12, 12/26/10, 06/21/08, 04/20/06, 04/11/03,  01/05/03  . Cancer ([place])    melanoma  . Cardiac arrhythmia   . Cataract   . Chicken pox   . Colon polyp 12/29/10, 04/18/06, 01/05/03  . Diplopia   . Dysrhythmia   . Essential hypertension   . Exotropia, left eye   . GERD (gastroesophageal reflux disease)   . Gout   . H/O: CVA (cerebrovascular accident)   . Hemorrhoid   . Hepatic cyst   . History of kidney stones   . Low testosterone   . Melanoma in situ ([place])    left ear, right cheek  . Migraine   . Near syncope   . Nephrolithiasis   . Nephrolithiasis   . Occlusion and stenosis of vertebral artery   . Psoriasis   . Reflux   . Splenomegaly   . Strabismus   . Subclavian steal syndrome   . Syncope and collapse   . Thrombocytopenia ([place])   . Ventricular premature depolarization   . Vertebral artery stenosis     SURGICAL HISTORY: Past Surgical History:  Procedure Laterality Date  . APPENDECTOMY    . COLONOSCOPY    . COLONOSCOPY WITH PROPOFOL N/A 03/13/2016   Procedure: COLONOSCOPY WITH PROPOFOL;  Surgeon: Lollie Sails, MD;  Location: [facility] ENDOSCOPY;  Service: Endoscopy;  Laterality: N/A;  . diplopia    . ESOPHAGOGASTRODUODENOSCOPY  08/2014, 06/14/2012  . ESOPHAGOGASTRODUODENOSCOPY (EGD) WITH PROPOFOL N/A 03/13/2016   Procedure: ESOPHAGOGASTRODUODENOSCOPY (EGD) WITH PROPOFOL;  Surgeon: Lollie Sails, MD;  Location: [facility] ENDOSCOPY;  Service: Endoscopy;  Laterality: N/A;  . ESOPHAGOGASTRODUODENOSCOPY (EGD) WITH PROPOFOL N/A 07/15/2018   Procedure: ESOPHAGOGASTRODUODENOSCOPY (EGD) WITH PROPOFOL;  Surgeon: Lollie Sails, MD;  Location: [facility] ENDOSCOPY;  Service: Endoscopy;  Laterality: N/A;  . EYE SURGERY  08/16/2003   eye sug  .  MOHS SURGERY  2011, 2012   left ear and right cheek  . STRABISMUS SURGERY  10/20/2015   2 horizontal muscles-left  . TONSILLECTOMY    . WISDOM TOOTH EXTRACTION      SOCIAL HISTORY: [place]; no smoking; 4-5 cocktails a week; retired from [employer].  Social History    Socioeconomic History  . Marital status: Married    Spouse name: Not on file  . Number of children: Not on file  . Years of education: Not on file  . Highest education level: Not on file  Occupational History  . Not on file  Social Needs  . Financial resource strain: Not on file  . Food insecurity    Worry: Not on file    Inability: Not on file  . Transportation needs    Medical: Not on file    Non-medical: Not on file  Tobacco Use  . Smoking status: Former Smoker    Packs/day: 0.25    Years: 10.00    Pack years: 2.50    Types: Cigarettes    Quit date: 01/30/1961    Years since quitting: 58.4  . Smokeless tobacco: Never Used  Substance and Sexual Activity  . Alcohol use: Yes    Alcohol/week: 0.0 standard drinks    Comment: social drinker  . Drug use: No  . Sexual activity: Yes  Lifestyle  . Physical activity    Days per week: Not on file    Minutes per session: Not on file  . Stress: Not on file  Relationships  . Social Herbalist on phone: Not on file    Gets together: Not on file    Attends religious service: Not on file    Active member of club or organization: Not on file    Attends meetings of clubs or organizations: Not on file    Relationship status: Not on file  . Intimate partner violence    Fear of current or ex partner: Not on file    Emotionally abused: Not on file    Physically abused: Not on file    Forced sexual activity: Not on file  Other Topics Concern  . Not on file  Social History Narrative  . Not on file    FAMILY HISTORY: Family History  Problem Relation Age of Onset  . Heart failure Mother   . Osteoporosis Mother   . Stroke Mother   . Heart attack Father   . Gout Father   . Diabetes Mellitus II Father   . Renal Disease Father   . Alcohol abuse Father   . Osteoporosis Maternal Grandfather   . Osteoporosis Maternal Grandmother     ALLERGIES:  is allergic to other.  MEDICATIONS:  Current Outpatient Medications   Medication Sig Dispense Refill  . augmented betamethasone dipropionate (DIPROLENE-AF) 0.05 % ointment     . CALCIUM ASCORBATE PO Take 1 tablet by mouth daily.    . calcium-vitamin D (OSCAL WITH D) 500-200 MG-UNIT tablet Take 1 tablet by mouth daily with breakfast.    . desonide (DESOWEN) 0.05 % cream     . finasteride (PROSCAR) 5 MG tablet Take 1 tablet (5 mg total) by mouth daily. 90 tablet 3  . meloxicam (MOBIC) 15 MG tablet Take 1 tablet (15 mg total) daily by mouth. 30 tablet 3  . Multiple Vitamin (MULTI-VITAMINS) TABS Take by mouth daily.     Marland Kitchen omeprazole (PRILOSEC) 20 MG capsule TAKE 1 CAPSULE BY MOUTH ONCE DAILY 1  HOUR BEFORE A MEAL    . tamsulosin (FLOMAX) 0.4 MG CAPS capsule TAKE 1 CAPSULE BY MOUTH ONCE DAILY 30 capsule 9  . Testosterone 20.25 MG/ACT (1.62%) GEL Apply to shoulders and/or upper arms only 5 grams    . valACYclovir (VALTREX) 1000 MG tablet Take 500 mg by mouth daily.     . meclizine (ANTIVERT) 25 MG tablet Take 1 tablet (25 mg total) by mouth 3 (three) times daily as needed. (Patient not taking: Reported on 07/15/2018) 60 tablet 3  . tacrolimus (PROTOPIC) 0.1 % ointment      No current facility-administered medications for this visit.       Marland Kitchen  PHYSICAL EXAMINATION:   Vitals:   06/15/19 1353  BP: (!) 155/79  Pulse: 64  Resp: 20  Temp: 97.6 F (36.4 C)   Filed Weights   06/15/19 1353  Weight: 167 lb (75.8 kg)    Physical Exam  Constitutional: He is oriented to person, place, and time and well-developed, well-nourished, and in no distress.  HENT:  Head: Normocephalic and atraumatic.  Mouth/Throat: Oropharynx is clear and moist. No oropharyngeal exudate.  Eyes: Pupils are equal, round, and reactive to light.  Neck: Normal range of motion. Neck supple.  Cardiovascular: Normal rate and regular rhythm.  Pulmonary/Chest: No respiratory distress. He has no wheezes.  Abdominal: Soft. Bowel sounds are normal. He exhibits no distension and no mass. There is  no abdominal tenderness. There is no rebound and no guarding.  Musculoskeletal: Normal range of motion.        General: No tenderness or edema.  Neurological: He is alert and oriented to person, place, and time.  Skin: Skin is warm.  Psychiatric: Affect normal.     LABORATORY DATA:  I have reviewed the data as listed Lab Results  Component Value Date   WBC 5.1 06/15/2019   HGB 17.0 06/15/2019   HCT 49.5 06/15/2019   MCV 92.5 06/15/2019   PLT 95 (L) 06/15/2019   Recent Labs    06/15/19 1321  NA 141  K 4.1  CL 104  CO2 29  GLUCOSE 102*  BUN 21  CREATININE 1.13  CALCIUM 9.1  GFRNONAA 60*  GFRAA >60  PROT 7.2  ALBUMIN 4.6  AST 28  ALT 19  ALKPHOS 53  BILITOT 1.2     ASSESSMENT & PLAN:   Thrombocytopenia ([facility]) # Chronic thrombocytopenia isolated- > 100 [since 2015]- Today platelets are 95/slightly lower than the baseline of 100,000.  Again discussed the differential ITP [most likely cause] vs? Alcohol use Vs  mild splenomegaly vs versus primary bone marrow process like myelodysplastic syndrome  [less likley] .   # I would not recommend bone marrow biopsy at this time/or any treatment recommendations at this time as patient is asymptomatic.  However since platelets are slightly lower than his baseline/I would recommend follow-up in 6 months than 12 month follow up.   # DISPOSITION:  follow up in 6 months- MD/labs-cbc/cmp/ldh- Dr.B Cc; Dr.Bronstein    Cammie Sickle, MD 06/15/2019 5:22 PM

## 2019-08-01 ENCOUNTER — Other Ambulatory Visit: Payer: Self-pay | Admitting: Urology

## 2019-10-02 ENCOUNTER — Other Ambulatory Visit: Payer: Self-pay | Admitting: Urology

## 2019-10-07 ENCOUNTER — Other Ambulatory Visit: Payer: Self-pay | Admitting: Urology

## 2019-10-09 ENCOUNTER — Encounter: Payer: Self-pay | Admitting: Urology

## 2019-10-09 ENCOUNTER — Other Ambulatory Visit: Payer: Self-pay

## 2019-10-09 ENCOUNTER — Ambulatory Visit: Payer: Medicare Other | Admitting: Urology

## 2019-10-09 VITALS — BP 152/79 | HR 72 | Ht 70.0 in | Wt 162.0 lb

## 2019-10-09 DIAGNOSIS — N401 Enlarged prostate with lower urinary tract symptoms: Secondary | ICD-10-CM

## 2019-10-09 LAB — BLADDER SCAN AMB NON-IMAGING: SCA Result: 47

## 2019-10-09 MED ORDER — FINASTERIDE 5 MG PO TABS: 5.0000 mg | ORAL_TABLET | Freq: Every day | ORAL | 2 refills | Status: DC

## 2019-10-09 NOTE — Progress Notes (Signed)
10/09/2019 9:12 AM   [name] 24-Jan-1934 [id number]  Referring provider: [name], MD [phone] [address] [place],  [place] [address]  Chief Complaint  Patient presents with  . Benign Prostatic Hypertrophy    Urologic history: 1.  BPH with LUTS -Tamsulosin/finasteride -Cystoscopy 02/2018 elevated bladder neck, trilobar enlargement   HPI: 86 YO male presents for annual follow-up of BPH.  Since his visit last year he has stable lower urinary tract symptoms which have not worsened.  He inquired about side effects of tamsulosin and finasteride as he has been experience some intermittent dizziness.  IPSS completed today was 9/35 with his most bothersome symptom being a weak urinary stream.  Denies dysuria, gross hematuria.   PMH: Past Medical History:  Diagnosis Date  . Adenomatous polyp 2012  . Allergic rhinitis   . Arrhythmia   . 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 Cancer ([place])    melanoma  . Cardiac arrhythmia   . Cataract   . Chicken pox   . Colon polyp 12/29/10, 04/18/06, 01/05/03  . Diplopia   . Dysrhythmia   . Essential hypertension   . Exotropia, left eye   . GERD (gastroesophageal reflux disease)   . Gout   . H/O: CVA (cerebrovascular accident)   . Hemorrhoid   . Hepatic cyst   . History of kidney stones   . Low testosterone   . Melanoma in situ ([place])    left ear, right cheek  . Migraine   . Near syncope   . Nephrolithiasis   . Nephrolithiasis   . Occlusion and stenosis of vertebral artery   . Psoriasis   . Reflux   . Splenomegaly   . Strabismus   . Subclavian steal syndrome   . Syncope and collapse   . Thrombocytopenia ([place])   . Ventricular premature depolarization   . Vertebral artery stenosis     Surgical History: Past Surgical History:  Procedure Laterality Date  . APPENDECTOMY    . COLONOSCOPY    . COLONOSCOPY WITH PROPOFOL N/A 03/13/2016   Procedure: COLONOSCOPY WITH PROPOFOL;  Surgeon:  Lollie Sails, MD;  Location: [facility] ENDOSCOPY;  Service: Endoscopy;  Laterality: N/A;  . diplopia    . ESOPHAGOGASTRODUODENOSCOPY  08/2014, 06/14/2012  . ESOPHAGOGASTRODUODENOSCOPY (EGD) WITH PROPOFOL N/A 03/13/2016   Procedure: ESOPHAGOGASTRODUODENOSCOPY (EGD) WITH PROPOFOL;  Surgeon: Lollie Sails, MD;  Location: [facility] ENDOSCOPY;  Service: Endoscopy;  Laterality: N/A;  . ESOPHAGOGASTRODUODENOSCOPY (EGD) WITH PROPOFOL N/A 07/15/2018   Procedure: ESOPHAGOGASTRODUODENOSCOPY (EGD) WITH PROPOFOL;  Surgeon: Lollie Sails, MD;  Location: [facility] ENDOSCOPY;  Service: Endoscopy;  Laterality: N/A;  . EYE SURGERY  08/16/2003   eye sug  . MOHS SURGERY  2011, 2012   left ear and right cheek  . STRABISMUS SURGERY  10/20/2015   2 horizontal muscles-left  . TONSILLECTOMY    . WISDOM TOOTH EXTRACTION      Home Medications:  Allergies as of 10/09/2019      Reactions   Other Diarrhea   Other reaction(s): Abdominal Pain tarragon Other reaction(s): Abdominal Pain tarragon      Medication List       Accurate as of October 09, 2019  9:12 AM. If you have any questions, ask your nurse or doctor.        augmented betamethasone dipropionate 0.05 % ointment Commonly known as: DIPROLENE-AF   CALCIUM ASCORBATE PO Take 1 tablet by mouth daily.   calcium-vitamin D 500-200 MG-UNIT tablet Commonly known  as: OSCAL WITH D Take 1 tablet by mouth daily with breakfast.   desonide 0.05 % cream Commonly known as: DESOWEN   finasteride 5 MG tablet Commonly known as: PROSCAR TAKE ONE TABLET BY MOUTH EVERY DAY   meclizine 25 MG tablet Commonly known as: ANTIVERT Take 1 tablet (25 mg total) by mouth 3 (three) times daily as needed.   meloxicam 15 MG tablet Commonly known as: MOBIC Take 1 tablet (15 mg total) daily by mouth.   Multi-Vitamins Tabs Take by mouth daily.   omeprazole 20 MG capsule Commonly known as: PRILOSEC TAKE 1 CAPSULE BY MOUTH ONCE DAILY 1 HOUR BEFORE A MEAL   tacrolimus  0.1 % ointment Commonly known as: PROTOPIC   tamsulosin 0.4 MG Caps capsule Commonly known as: FLOMAX TAKE 1 CAPSULE EVERY DAY   Testosterone 20.25 MG/ACT (1.62%) Gel Apply to shoulders and/or upper arms only 5 grams   valACYclovir 1000 MG tablet Commonly known as: VALTREX Take 500 mg by mouth daily.       Allergies:  Allergies  Allergen Reactions  . Other Diarrhea    Other reaction(s): Abdominal Pain tarragon Other reaction(s): Abdominal Pain tarragon    Family History: Family History  Problem Relation Age of Onset  . Heart failure Mother   . Osteoporosis Mother   . Stroke Mother   . Heart attack Father   . Gout Father   . Diabetes Mellitus II Father   . Renal Disease Father   . Alcohol abuse Father   . Osteoporosis Maternal Grandfather   . Osteoporosis Maternal Grandmother     Social History:  reports that he quit smoking about 58 years ago. His smoking use included cigarettes. He has a 2.50 pack-year smoking history. He has never used smokeless tobacco. He reports current alcohol use. He reports that he does not use drugs.  ROS: UROLOGY Frequent Urination?: No Hard to postpone urination?: No Burning/pain with urination?: No Get up at night to urinate?: No Leakage of urine?: No Urine stream starts and stops?: No Trouble starting stream?: No Do you have to strain to urinate?: No Blood in urine?: No Urinary tract infection?: No Sexually transmitted disease?: No Injury to kidneys or bladder?: No Painful intercourse?: No Weak stream?: Yes Erection problems?: No Penile pain?: No  Gastrointestinal Nausea?: No Vomiting?: No Indigestion/heartburn?: No Diarrhea?: No Constipation?: No  Constitutional Fever: No Night sweats?: No Weight loss?: No Fatigue?: No  Skin Skin rash/lesions?: No Itching?: No  Eyes Blurred vision?: No Double vision?: No  Ears/Nose/Throat Sore throat?: No Sinus problems?: No  Hematologic/Lymphatic Swollen glands?:  No Easy bruising?: No  Cardiovascular Leg swelling?: No Chest pain?: No  Respiratory Cough?: No Shortness of breath?: No  Endocrine Excessive thirst?: No  Musculoskeletal Back pain?: No Joint pain?: No  Neurological Headaches?: No Dizziness?: No  Psychologic Depression?: No Anxiety?: No  Physical Exam: BP (!) 152/79   Pulse 72   Ht 5\' 10"  (1.778 m)   Wt 162 lb (73.5 kg)   BMI 23.24 kg/m   Constitutional:  Alert, No acute distress. HEENT: [place] AT, moist mucus membranes.  Trachea midline, no masses. Cardiovascular: No clubbing, cyanosis, or edema. Respiratory: Normal respiratory effort, no increased work of breathing. Skin: No rashes, bruises or suspicious lesions. Neurologic: Grossly intact, no focal deficits, moving all 4 extremities. Psychiatric: Normal mood and affect.   Assessment & Plan:    - BPH with LUTS Stable lower urinary tract symptoms.  We discussed the possible association of his dizziness with  tamsulosin.  I recommended we briefly discontinue the tamsulosin to see if the side effects improve.  If the has worsening lower urinary tract symptoms off tamsulosin will start silodosin.  PVR by bladder scan today was 47 mL.  Finasteride was refilled.  Continue annual follow-up.  Abbie Sons, [place] [address] [place], [place] [address] [phone]

## 2019-12-14 ENCOUNTER — Encounter: Payer: Self-pay | Admitting: Internal Medicine

## 2019-12-14 ENCOUNTER — Inpatient Hospital Stay: Payer: Medicare PPO | Attending: Internal Medicine

## 2019-12-14 ENCOUNTER — Inpatient Hospital Stay (HOSPITAL_BASED_OUTPATIENT_CLINIC_OR_DEPARTMENT_OTHER): Payer: Medicare PPO | Admitting: Internal Medicine

## 2019-12-14 ENCOUNTER — Other Ambulatory Visit: Payer: Self-pay

## 2019-12-14 DIAGNOSIS — Z87891 Personal history of nicotine dependence: Secondary | ICD-10-CM | POA: Diagnosis not present

## 2019-12-14 DIAGNOSIS — D696 Thrombocytopenia, unspecified: Secondary | ICD-10-CM | POA: Diagnosis not present

## 2019-12-14 DIAGNOSIS — Z79899 Other long term (current) drug therapy: Secondary | ICD-10-CM | POA: Insufficient documentation

## 2019-12-14 DIAGNOSIS — Z85820 Personal history of malignant melanoma of skin: Secondary | ICD-10-CM | POA: Diagnosis not present

## 2019-12-14 DIAGNOSIS — I10 Essential (primary) hypertension: Secondary | ICD-10-CM | POA: Diagnosis not present

## 2019-12-14 DIAGNOSIS — Z8249 Family history of ischemic heart disease and other diseases of the circulatory system: Secondary | ICD-10-CM | POA: Diagnosis not present

## 2019-12-14 DIAGNOSIS — Z8673 Personal history of transient ischemic attack (TIA), and cerebral infarction without residual deficits: Secondary | ICD-10-CM | POA: Insufficient documentation

## 2019-12-14 DIAGNOSIS — Z8262 Family history of osteoporosis: Secondary | ICD-10-CM | POA: Insufficient documentation

## 2019-12-14 DIAGNOSIS — K219 Gastro-esophageal reflux disease without esophagitis: Secondary | ICD-10-CM | POA: Insufficient documentation

## 2019-12-14 DIAGNOSIS — Z791 Long term (current) use of non-steroidal anti-inflammatories (NSAID): Secondary | ICD-10-CM | POA: Insufficient documentation

## 2019-12-14 DIAGNOSIS — Z833 Family history of diabetes mellitus: Secondary | ICD-10-CM | POA: Insufficient documentation

## 2019-12-14 LAB — COMPREHENSIVE METABOLIC PANEL
ALT: 19 U/L (ref 0–44)
AST: 26 U/L (ref 15–41)
Albumin: 4.4 g/dL (ref 3.5–5.0)
Alkaline Phosphatase: 56 U/L (ref 38–126)
Anion gap: 9 (ref 5–15)
BUN: 22 mg/dL (ref 8–23)
CO2: 28 mmol/L (ref 22–32)
Calcium: 9 mg/dL (ref 8.9–10.3)
Chloride: 104 mmol/L (ref 98–111)
Creatinine, Ser: 1.08 mg/dL (ref 0.61–1.24)
GFR calc Af Amer: >60 mL/min (ref >60)
GFR calc non Af Amer: >60 mL/min (ref >60)
Glucose, Bld: 89 mg/dL (ref 70–99)
Potassium: 4 mmol/L (ref 3.5–5.1)
Sodium: 141 mmol/L (ref 135–145)
Total Bilirubin: 1.3 mg/dL — ABNORMAL HIGH (ref 0.3–1.2)
Total Protein: 6.9 g/dL (ref 6.5–8.1)

## 2019-12-14 LAB — CBC WITH DIFFERENTIAL/PLATELET
Abs Immature Granulocytes: 0.03 10*3/uL (ref 0.00–0.07)
Basophils Absolute: 0 10*3/uL (ref 0.0–0.1)
Basophils Relative: 1 %
Eosinophils Absolute: 0.1 10*3/uL (ref 0.0–0.5)
Eosinophils Relative: 2 %
HCT: 49.8 % (ref 39.0–52.0)
Hemoglobin: 17.2 g/dL — ABNORMAL HIGH (ref 13.0–17.0)
Immature Granulocytes: 1 %
Lymphocytes Relative: 26 %
Lymphs Abs: 1.5 10*3/uL (ref 0.7–4.0)
MCH: 31.7 pg (ref 26.0–34.0)
MCHC: 34.5 g/dL (ref 30.0–36.0)
MCV: 91.9 fL (ref 80.0–100.0)
Monocytes Absolute: 0.5 10*3/uL (ref 0.1–1.0)
Monocytes Relative: 8 %
Neutro Abs: 3.7 10*3/uL (ref 1.7–7.7)
Neutrophils Relative %: 62 %
Platelets: 107 10*3/uL — ABNORMAL LOW (ref 150–400)
RBC: 5.42 MIL/uL (ref 4.22–5.81)
RDW: 13 % (ref 11.5–15.5)
WBC: 5.9 10*3/uL (ref 4.0–10.5)
nRBC: 0 % (ref 0.0–0.2)

## 2019-12-14 LAB — LACTATE DEHYDROGENASE: LDH: 130 U/L (ref 98–192)

## 2019-12-14 NOTE — Progress Notes (Signed)
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  male here for follow-up because of his thrombocytopenia.   Patient denies any easy bruising or bleeding.  Denies any blood in stools or black or stools.  He continues to drink 4-6 liquor each week.  Continue physical active/avid cyclist.  Review of Systems  Constitutional: Negative for chills, diaphoresis, fever, malaise/fatigue and weight loss.  HENT: Negative for nosebleeds and sore throat.   Eyes: Negative for double vision.  Respiratory: Negative for cough, hemoptysis, sputum production, shortness of breath and wheezing.   Cardiovascular: Negative for chest pain, palpitations, orthopnea and leg swelling.  Gastrointestinal: Negative for abdominal pain, blood in stool, constipation, diarrhea, heartburn, melena, nausea and vomiting.  Genitourinary: Negative for dysuria, frequency and urgency.  Musculoskeletal: Negative for back pain and joint pain.  Skin: Negative.  Negative for itching and rash.  Neurological: Negative for dizziness, tingling, focal weakness, weakness and headaches.  Endo/Heme/Allergies: Does not bruise/bleed easily.  Psychiatric/Behavioral: Negative for depression. The patient is not nervous/anxious and does not have insomnia.      MEDICAL HISTORY:  Past Medical History:  Diagnosis Date  . Adenomatous polyp 2012  . Allergic rhinitis   . Arrhythmia   . Barrett esophagus 08/12/12, 12/26/10, 06/21/08, 04/20/06, 04/11/03, 01/05/03   . Cancer ([place])    melanoma  . Cardiac arrhythmia   . Cataract   . Chicken pox   . Colon polyp 12/29/10, 04/18/06, 01/05/03  . Diplopia   . Dysrhythmia   . Essential hypertension   . Exotropia, left eye   . GERD (gastroesophageal reflux disease)   . Gout   . H/O: CVA (cerebrovascular accident)   . Hemorrhoid   . Hepatic cyst   . History of kidney stones   . Low testosterone   . Melanoma in situ ([place])    left ear, right cheek  . Migraine   . Near syncope   . Nephrolithiasis   . Nephrolithiasis   . Occlusion and stenosis of vertebral artery   . Psoriasis   . Reflux   . Splenomegaly   . Strabismus   . Subclavian steal syndrome   . Syncope and collapse   . Thrombocytopenia ([place])   . Ventricular premature depolarization   . Vertebral artery stenosis     SURGICAL HISTORY: Past Surgical History:  Procedure Laterality Date  . APPENDECTOMY    . COLONOSCOPY    . COLONOSCOPY WITH PROPOFOL N/A 03/13/2016   Procedure: COLONOSCOPY WITH PROPOFOL;  Surgeon: Lollie Sails, MD;  Location[facility] END[facility]COPY;  Service: Endoscopy;  Laterality: N/A;  . diplopia    . ESOPHAGOGASTRODUODENOSCOPY  08/2014, 06/14/2012  . ESOPHAGOGASTRODUODENOSCOPY (EGD) WITH PROPOFOL N/A 03/13/2016   Procedure: ESOPHAGOGASTRODUODENOSCOPY (EGD) WITH PROPOFOL;  Surgeon: Lollie Sails, MD;  Location: [facility] ENDOSCOPY;  Service: Endoscopy;  Laterality: N/A;  . ESOPHAGOGASTRODUODENOSCOPY (EGD) WITH PROPOFOL N/A 07/15/2018   Procedure: ESOPHAGOGASTRODUODENOSCOPY (EGD) WITH PROPOFOL;  Surgeon: Lollie Sails, MD;  Location[facility] ENDOSCOPY;  Service: Endoscopy;  Laterality: N/A;  . EYE SURGERY  08/16/2003   eye sug  . [place]  2011,  2012   left ear and right cheek  . STRABISMUS SURGERY  10/20/2015   2 horizontal muscles-left  . TONSILLECTOMY    . WISDOM TOOTH EXTRACTION      SOCIAL HISTORY: Social History   Socioeconomic History  . Marital status: Married    Spouse name: Not on file  . Number of  children: Not on file  . Years of education: Not on file  . Highest education level: Not on file  Occupational History  . Not on file  Tobacco Use  . Smoking status: Former Smoker    Packs/day: 0.25    Years: 10.00    Pack years: 2.50    Types: Cigarettes    Quit date: 01/30/1961    Years since quitting: 58.9  . Smokeless tobacco: Never Used  Substance and Sexual Activity  . Alcohol use: Yes    Alcohol/week: 0.0 standard drinks    Comment: social drinker  . Drug use: No  . Sexual activity: Yes  Other Topics Concern  . Not on file  Social History Narrative    Lives in [place]; no smoking; 4-5 cocktails a week; retired from Radiation protection practitioner; patient is a avid cyclist.   Social Determinants of Radio broadcast assistant Strain:   . Difficulty of Paying Living Expenses:   Food Insecurity:   . Worried About Charity fundraiser in the Last Year:   . Arboriculturist in the Last Year:   Transportation Needs:   . Film/video editor (Medical):   Marland Kitchen Lack of Transportation (Non-Medical):   Physical Activity:   . Days of Exercise per Week:   . Minutes of Exercise per Session:   Stress:   . Feeling of Stress :   Social Connections:   . Frequency of Communication with Friends and Family:   . Frequency of Social Gatherings with Friends and Family:   . Attends Religious Services:   . Active Member of Clubs or Organizations:   . Attends Archivist Meetings:   Marland Kitchen Marital Status:   Intimate Partner Violence:   . Fear of Current or Ex-Partner:   . Emotionally Abused:   Marland Kitchen Physically Abused:   . Sexually Abused:     FAMILY HISTORY: Family History  Problem Relation Age of Onset  . Heart failure Mother   . Osteoporosis Mother   . Stroke Mother   . Heart attack Father   . Gout Father   . Diabetes Mellitus II Father   . Renal Disease Father   . Alcohol abuse Father   . Osteoporosis Maternal Grandfather   . Osteoporosis Maternal Grandmother     ALLERGIES:  is  allergic to other.  MEDICATIONS:  Current Outpatient Medications  Medication Sig Dispense Refill  . augmented betamethasone dipropionate (DIPROLENE-AF) 0.05 % ointment     . CALCIUM ASCORBATE PO Take 1 tablet by mouth daily.    . calcium-vitamin D (OSCAL WITH D) 500-200 MG-UNIT tablet Take 1 tablet by mouth daily with breakfast.    . desonide (DESOWEN) 0.05 % cream     . finasteride (PROSCAR) 5 MG tablet Take 1 tablet (5 mg total) by mouth daily. 90 tablet 2  . meclizine (ANTIVERT) 25 MG tablet Take 1 tablet (25 mg total) by mouth 3 (three) times daily as needed. 60 tablet 3  . meloxicam (MOBIC) 15 MG tablet Take 1 tablet (15 mg total) daily by mouth. 30 tablet 3  . Multiple Vitamin (MULTI-VITAMINS) TABS Take by mouth daily.     Marland Kitchen  omeprazole (PRILOSEC) 20 MG capsule TAKE 1 CAPSULE BY MOUTH ONCE DAILY 1 HOUR BEFORE A MEAL    . tacrolimus (PROTOPIC) 0.1 % ointment     . Testosterone 20.25 MG/ACT (1.62%) GEL Apply to shoulders and/or upper arms only 5 grams    . valACYclovir (VALTREX) 1000 MG tablet Take 500 mg by mouth daily.      No current facility-administered medications for this visit.      Marland Kitchen  PHYSICAL EXAMINATION:   Vitals:   12/14/19 1345  BP: (!) 146/84  Pulse: 66  Temp: 97.8 F (36.6 C)   Filed Weights   12/14/19 1345  Weight: 166 lb (75.3 kg)    Physical Exam  Constitutional: He is oriented to person, place, and time and well-developed, well-nourished, and in no distress.  HENT:  Head: Normocephalic and atraumatic.  Mouth/Throat: Oropharynx is clear and moist. No oropharyngeal exudate.  Eyes: Pupils are equal, round, and reactive to light.  Cardiovascular: Normal rate and regular rhythm.  Pulmonary/Chest: No respiratory distress. He has no wheezes.  Abdominal: Soft. Bowel sounds are normal. He exhibits no distension and no mass. There is no abdominal tenderness. There is no rebound and no guarding.  Musculoskeletal:        General: No tenderness or edema.  Normal range of motion.     Cervical back: Normal range of motion and neck supple.  Neurological: He is alert and oriented to person, place, and time.  Skin: Skin is warm.  Psychiatric: Affect normal.     LABORATORY DATA:  I have reviewed the data as listed Lab Results  Component Value Date   WBC 5.9 12/14/2019   HGB 17.2 (H) 12/14/2019   HCT 49.8 12/14/2019   MCV 91.9 12/14/2019   PLT 107 (L) 12/14/2019   Recent Labs    06/15/19 1321 12/14/19 1317  NA 141 141  K 4.1 4.0  CL 104 104  CO2 29 28  GLUCOSE 102* 89  BUN 21 22  CREATININE 1.13 1.08  CALCIUM 9.1 9.0  GFRNONAA 60* >60  GFRAA >60 >60  PROT 7.2 6.9  ALBUMIN 4.6 4.4  AST 28 26  ALT 19 19  ALKPHOS 53 56  BILITOT 1.2 1.3*     ASSESSMENT & PLAN:   Thrombocytopenia (HCC) # Chronic thrombocytopenia isolated- > 100 [since 2015]-platelets today 107.  Clinically suspicious of ITP [although mild splenomegaly]; less likely from alcohol/MDS.  No bone marrow.  #As patient is currently asymptomatic.  Continue monitoring; patient has appointment with PCP in 6 months.  # DISPOSITION:  # follow up in 12 months- MD/labs-cbc/cmp/ldh- Dr.B  Cc; Dr.Bronstein    Cammie Sickle, MD 12/15/2019 8:24 AM

## 2019-12-14 NOTE — Assessment & Plan Note (Addendum)
#   Chronic thrombocytopenia isolated- > 100 [since 2015]-platelets today 107.  Clinically suspicious of ITP [although mild splenomegaly]; less likely from alcohol/MDS.  No bone marrow.  #As patient is currently asymptomatic.  Continue monitoring; patient has appointment with PCP in 6 months.  # DISPOSITION:  # follow up in 12 months- MD/labs-cbc/cmp/ldh- Dr.B  Cc; Dr.Bronstein

## 2019-12-15 ENCOUNTER — Encounter: Payer: Self-pay | Admitting: Internal Medicine

## 2020-03-16 ENCOUNTER — Encounter: Payer: Self-pay | Admitting: Cardiovascular Disease

## 2020-03-16 ENCOUNTER — Ambulatory Visit: Payer: Medicare PPO | Admitting: Cardiovascular Disease

## 2020-03-16 ENCOUNTER — Other Ambulatory Visit: Payer: Self-pay

## 2020-03-16 VITALS — BP 150/84 | HR 63 | Ht 70.0 in | Wt 166.5 lb

## 2020-03-16 DIAGNOSIS — I493 Ventricular premature depolarization: Secondary | ICD-10-CM | POA: Diagnosis not present

## 2020-03-16 NOTE — Progress Notes (Signed)
Cardiology Office Note  Date:  03/16/2020   ID:  [name], DOB 09-Jan-1934, MRN [id number]  PCP:  [name], MD   Chief Complaint  Patient presents with  . New Patient (Initial Visit)    Pt has chest pain. Meds verbally reviewed w/ pt.     HPI:  86 y.o. male with hx of  Former smoker, quit 1962 presyncopal episode while sitting in his car at a stop light on 06/03/2014.  hospital with significant workup including EKG, carotid ultrasound showing no plaque, Reversal of flow in the right vertebral artery suggesting subclavian steal syndrome and proximal right subclavian artery stenosis or occlusion, Echocardiogram with normal ejection fraction, Telemetry in the hospital showing PVCs and APCs He presents today for follow-up of his near syncope, vertebral artery disease and ectopy on Holter  Last seen 11/2016  Notes from oncology reviewed History of chronic isolated thrombocytopenia Recent platelet count 107  In general reports feeling well, is active, lots of cycling 4-5 days a week No chest pain or significant shortness of breath with cycling Goes 25 to 50 miles, couple hours of biking at a time Gradual decline in endurance over the past several years Heart beat up to 180 with working out  Long discussion concerning recent episodes of chest pain In the past several weeks, Has had episodes of chest pain, diffuse across the chest, worse with sitting Better with getting up and walking Pain 4-5/10 No tenderness on palplation, sometimes breathing deep may make it feel better 3 episodes in 2 weeks 15 min- 20, then resolves  Prior history of GERD, managed with omeprazole  No near syncope or syncope  LDL 80, total cholesterol 130  EKG personally reviewed by myself on todays visit Shows normal sinus rhythm rate of 63 bpm no significant ST or T wave changes  Other past medical history  Previous Holter had shown PVCs and APCs, sometimes short runs of atrialcouplets  and triplets.    PMH:   has a past medical history of Adenomatous polyp (2012), Allergic rhinitis, Arrhythmia, Barrett esophagus (08/12/12, 12/26/10, 06/21/08, 04/20/06, 04/11/03, 01/05/03), Cancer [facility]), Cardiac arrhythmia, Cataract, Chicken pox, Colon polyp (12/29/10, 04/18/06, 01/05/03), Diplopia, Dysrhythmia, Essential hypertension, Exotropia, left eye, GERD (gastroesophageal reflux disease), Gout, H/O: CVA (cerebrovascular accident), Hemorrhoid, Hepatic cyst, History of kidney stones, Low testosterone, Melanoma in situ ([place]), Migraine, Near syncope, Nephrolithiasis, Nephrolithiasis, Occlusion and stenosis of vertebral artery, Psoriasis, Reflux, Splenomegaly, Strabismus, Subclavian steal syndrome, Syncope and collapse, Thrombocytopenia ([place]), Ventricular premature depolarization, and Vertebral artery stenosis.  PSH:    Past Surgical History:  Procedure Laterality Date  . APPENDECTOMY    . COLONOSCOPY    . COLONOSCOPY WITH PROPOFOL N/A 03/13/2016   Procedure: COLONOSCOPY WITH PROPOFOL;  Surgeon: Lollie Sails, MD;  Location: [facility] ENDOSCOPY;  Service: Endoscopy;  Laterality: N/A;  . diplopia    . ESOPHAGOGASTRODUODENOSCOPY  08/2014, 06/14/2012  . ESOPHAGOGASTRODUODENOSCOPY (EGD) WITH PROPOFOL N/A 03/13/2016   Procedure: ESOPHAGOGASTRODUODENOSCOPY (EGD) WITH PROPOFOL;  Surgeon: Lollie Sails, MD;  Location: [facility] ENDOSCOPY;  Service: Endoscopy;  Laterality: N/A;  . ESOPHAGOGASTRODUODENOSCOPY (EGD) WITH PROPOFOL N/A 07/15/2018   Procedure: ESOPHAGOGASTRODUODENOSCOPY (EGD) WITH PROPOFOL;  Surgeon: Lollie Sails, MD;  Location: [facility] ENDOSCOPY;  Service: Endoscopy;  Laterality: N/A;  . EYE SURGERY  08/16/2003   eye sug  . MOHS SURGERY  2011, 2012   left ear and right cheek  . STRABISMUS SURGERY  10/20/2015   2 horizontal muscles-left  . TONSILLECTOMY    . WISDOM TOOTH  EXTRACTION      Current Outpatient Medications  Medication Sig Dispense Refill  . augmented betamethasone dipropionate  (DIPROLENE-AF) 0.05 % ointment     . CALCIUM ASCORBATE PO Take 1 tablet by mouth daily.    . calcium-vitamin D (OSCAL WITH D) 500-200 MG-UNIT tablet Take 1 tablet by mouth daily with breakfast.    . desonide (DESOWEN) 0.05 % cream     . finasteride (PROSCAR) 5 MG tablet Take 1 tablet (5 mg total) by mouth daily. 90 tablet 2  . Multiple Vitamin (MULTI-VITAMINS) TABS Take by mouth daily.     Marland Kitchen omeprazole (PRILOSEC) 20 MG capsule TAKE 1 CAPSULE BY MOUTH ONCE DAILY 1 HOUR BEFORE A MEAL    . tacrolimus (PROTOPIC) 0.1 % ointment     . Testosterone 20.25 MG/ACT (1.62%) GEL Apply to shoulders and/or upper arms only 5 grams    . valACYclovir (VALTREX) 1000 MG tablet Take 500 mg by mouth daily.      No current facility-administered medications for this visit.    Allergies:   Other   Social History:  The patient  reports that he quit smoking about 59 years ago. His smoking use included cigarettes. He has a 2.50 pack-year smoking history. He has never used smokeless tobacco. He reports current alcohol use. He reports that he does not use drugs.   Family History:   family history includes Alcohol abuse in his father; Diabetes Mellitus II in his father; Gout in his father; Heart attack in his father; Heart failure in his mother; Osteoporosis in his maternal grandfather, maternal grandmother, and mother; Renal Disease in his father; Stroke in his mother.    Review of Systems: Review of Systems  Constitutional: Negative.   Respiratory: Negative.   Cardiovascular: Negative.   Gastrointestinal: Negative.   Musculoskeletal: Negative.   Neurological: Positive for dizziness.  Psychiatric/Behavioral: Negative.   All other systems reviewed and are negative.    PHYSICAL EXAM: VS:  BP (!) 150/84 (BP Location: Left Arm, Patient Position: Sitting, Cuff Size: Normal)   Pulse 63   Ht 5\' 10"  (1.778 m)   Wt 166 lb 8 oz (75.5 kg)   SpO2 97%   BMI 23.89 kg/m  , BMI Body mass index is 23.89  kg/m. Constitutional:  oriented to person, place, and time. No distress.  HENT:  Head: Grossly normal Eyes:  no discharge. No scleral icterus.  Neck: No JVD, no carotid bruits  Cardiovascular: Regular rate and rhythm, no murmurs appreciated Pulmonary/Chest: Clear to auscultation bilaterally, no wheezes or rails Abdominal: Soft.  no distension.  no tenderness.  Musculoskeletal: Normal range of motion Neurological:  normal muscle tone. Coordination normal. No atrophy Skin: Skin warm and dry Psychiatric: normal affect, pleasant   Recent Labs: 12/14/2019: ALT 19; BUN 22; Creatinine, Ser 1.08; Hemoglobin 17.2; Platelets 107; Potassium 4.0; Sodium 141    Lipid Panel Lab Results  Component Value Date   CHOL 109 06/04/2014   HDL 23 (L) 06/04/2014   LDLCALC 54 06/04/2014   TRIG 160 06/04/2014      Wt Readings from Last 3 Encounters:  03/16/20 166 lb 8 oz (75.5 kg)  12/14/19 166 lb (75.3 kg)  10/09/19 162 lb (73.5 kg)      ASSESSMENT AND PLAN:  Chest pain Atypical in nature, presenting at rest, improved by standing up and walking around 3 episodes, none recently since he made this appointment Good exercise tolerance, does not present with exertion No other associated symptoms such as shortness of  breath on exertion concerning for ischemia Feels it is less likely GERD but he will watch the symptoms -Discussed various options for work-up including chest CT scan, echocardiogram stress testing Given normal EKG normal clinical exam, less likely cardiac in etiology, echo and stress test would likely be unrevealing -Recommended if he has recurrent symptoms that we could consider chest CT scan with contrast  Near syncope - Plan: EKG 12-Lead No recent episodes of near syncope or syncope  Other cardiac arrhythmia - Plan: EKG 12-Lead Denies any tachycardia palpitations concerning for arrhythmia  Obstruction of right vertebral artery - Plan: EKG 12-Lead Reviewed prior CT scan neck  with him from October 2015 Currently not on a statin, added exercise Cholesterol well within goal   Total encounter time more than 45 minutes  Greater than 50% was spent in counseling and coordination of care with the patient    Orders Placed This Encounter  Procedures  . EKG 12-Lead     Signed, Esmond Plants, M.D., Ph.D. 03/16/2020  [place], [place]

## 2020-03-16 NOTE — Patient Instructions (Signed)

## 2020-08-25 ENCOUNTER — Telehealth: Payer: Self-pay | Admitting: Cardiovascular Disease

## 2020-08-25 NOTE — Telephone Encounter (Signed)
Pt c/o of Chest Pain: STAT if CP now or developed within 24 hours  1. Are you having CP right now? Yes interim  2. Are you experiencing any other symptoms (ex. SOB, nausea, vomiting, sweating)? No chest discomfort upper left chest dull no pressure   3. How long have you been experiencing CP? This episode started yesterday   4. Is your CP continuous or coming and going? Comes and goes   5. Have you taken Nitroglycerin? No   Scheduled next available 1/25 [name] patient wants asap states he may be dead by then  ?

## 2020-08-25 NOTE — Telephone Encounter (Signed)
Spoke with patient. States he started having on and off left upper chest pain yesterday. It usually lasts less than 1 minute but may occur several times during the minute. At times, he does not notice it unless he is thinking about it. Denies additional symptoms such as shortness of breath, sweating, nausea, vomiting, jaw or left arm pain.  In reading Dr Donivan Scull last office visit note: Chest pain Atypical in nature, presenting at rest, improved by standing up and walking around 3 episodes, none recently since he made this appointment Good exercise tolerance, does not present with exertion No other associated symptoms such as shortness of breath on exertion concerning for ischemia Feels it is less likely GERD but he will watch the symptoms -Discussed various options for work-up including chest CT scan, echocardiogram stress testing Given normal EKG normal clinical exam, less likely cardiac in etiology, echo and stress test would likely be unrevealing -Recommended if he has recurrent symptoms that we could consider chest CT scan with contrast  Initially, advised him he should to to the ER for emergent workup. He did not want to do that. Dr Rockey Situ has DOD appointment opening on Monday, 08/29/20. Patient agreeable to that appointment. However, I advised patient that if the chest pain continued to come and go over the next few hours, he should proceed to the nearest emergency room for emergent evaluation with the plan for urgent work up and still keep appointment for Monday. Pt verbalized understanding to call 911 or go to the emergency room, if he develops any new, worsening or continued symptoms.

## 2020-08-28 NOTE — Progress Notes (Signed)
Cardiology Office Note  Date:  08/29/2020   ID:  [name], DOB [birth year], MRN [id number]  PCP:  [name], MD   Chief Complaint  Patient presents with   OTHER    C/o chest pains for about three days. Medications verbally reviewed with patient.     HPI:  86 y.o. male with hx of  Former smoker, quit 1962 Atypical chest pain 2 years, at rest, not with exertion presyncopal episode while sitting in his car at a stop light on 06/03/2014.  hospital with significant workup including EKG, carotid ultrasound showing no plaque, Reversal of flow in the right vertebral artery suggesting subclavian steal syndrome and proximal right subclavian artery stenosis or occlusion, Echocardiogram with normal ejection fraction, Telemetry in the hospital showing PVCs and APCs He presents today for follow-up of his near syncope, vertebral artery disease and ectopy on Holter  Last seen 03/2020 History of chronic isolated thrombocytopenia At that time was having some atypical chest pain symptoms Worse with sitting, better with getting up and walking, exercising  On further discussion today, since his last visit Pain went away, Came back last week, Thursday Lasted 3 days, 1 day noticed it in the morning Went away No precipitating factors Exercised yesterday and today without any chest discomfort Pain on the left side, diffuse, does not feel musculoskeletal  Similar symptoms as several months ago He does have GERD symptoms, takes omeprazole  LDL 80, total cholesterol 130  EKG personally reviewed by myself on todays visit Shows normal sinus rhythm rate of 86 bpm no significant ST or T wave changes  Other past medical history  Previous Holter had shown PVCs and APCs, sometimes short runs of atrialcouplets and triplets.    PMH:   has a past medical history of Adenomatous polyp (2012), Allergic rhinitis, Arrhythmia, Barrett esophagus (08/12/12, 12/26/10, 06/21/08, 04/20/06, 04/11/03,  01/05/03), Cancer [facility]), Cardiac arrhythmia, Cataract, Chicken pox, Colon polyp (12/29/10, 04/18/06, 01/05/03), Diplopia, Dysrhythmia, Essential hypertension, Exotropia, left eye, GERD (gastroesophageal reflux disease), Gout, H/O: CVA (cerebrovascular accident), Hemorrhoid, Hepatic cyst, History of kidney stones, Low testosterone, Melanoma in situ ([place]), Migraine, Near syncope, Nephrolithiasis, Nephrolithiasis, Occlusion and stenosis of vertebral artery, Psoriasis, Reflux, Splenomegaly, Strabismus, Subclavian steal syndrome, Syncope and collapse, Thrombocytopenia ([place]), Ventricular premature depolarization, and Vertebral artery stenosis.  PSH:    Past Surgical History:  Procedure Laterality Date   APPENDECTOMY     COLONOSCOPY     COLONOSCOPY WITH PROPOFOL N/A 03/13/2016   Procedure: COLONOSCOPY WITH PROPOFOL;  Surgeon: Lollie Sails, MD;  Location: [facility] ENDOSCOPY;  Service: Endoscopy;  Laterality: N/A;   diplopia     ESOPHAGOGASTRODUODENOSCOPY  08/2014, 06/14/2012   ESOPHAGOGASTRODUODENOSCOPY (EGD) WITH PROPOFOL N/A 03/13/2016   Procedure: ESOPHAGOGASTRODUODENOSCOPY (EGD) WITH PROPOFOL;  Surgeon: Lollie Sails, MD;  Location: [facility] ENDOSCOPY;  Service: Endoscopy;  Laterality: N/A;   ESOPHAGOGASTRODUODENOSCOPY (EGD) WITH PROPOFOL N/A 07/15/2018   Procedure: ESOPHAGOGASTRODUODENOSCOPY (EGD) WITH PROPOFOL;  Surgeon: Lollie Sails, MD;  Location: [facility] ENDOSCOPY;  Service: Endoscopy;  Laterality: N/A;   EYE SURGERY  08/16/2003   eye sug   MOHS SURGERY  2011, 2012   left ear and right cheek   STRABISMUS SURGERY  10/20/2015   2 horizontal muscles-left   TONSILLECTOMY     WISDOM TOOTH EXTRACTION      Current Outpatient Medications  Medication Sig Dispense Refill   augmented betamethasone dipropionate (DIPROLENE-AF) 0.05 % ointment      CALCIUM ASCORBATE PO Take 1 tablet by mouth daily.  calcium-vitamin D (OSCAL WITH D) 500-200 MG-UNIT tablet Take 1 tablet by mouth daily  with breakfast.     desonide (DESOWEN) 0.05 % cream      finasteride (PROSCAR) 5 MG tablet Take 1 tablet (5 mg total) by mouth daily. 90 tablet 2   Multiple Vitamin (MULTI-VITAMINS) TABS Take by mouth daily.      omeprazole (PRILOSEC) 20 MG capsule TAKE 1 CAPSULE BY MOUTH ONCE DAILY 1 HOUR BEFORE A MEAL     tacrolimus (PROTOPIC) 0.1 % ointment      Testosterone 20.25 MG/ACT (1.62%) GEL Apply to shoulders and/or upper arms only 5 grams     valACYclovir (VALTREX) 1000 MG tablet Take 500 mg by mouth daily.      No current facility-administered medications for this visit.    Allergies:   Other   Social History:  The patient  reports that he quit smoking about 59 years ago. His smoking use included cigarettes. He has a 2.50 pack-year smoking history. He has never used smokeless tobacco. He reports current alcohol use. He reports that he does not use drugs.   Family History:   family history includes Alcohol abuse in his father; Diabetes Mellitus II in his father; Gout in his father; Heart attack in his father; Heart failure in his mother; Osteoporosis in his maternal grandfather, maternal grandmother, and mother; Renal Disease in his father; Stroke in his mother.    Review of Systems: Review of Systems  Constitutional: Negative.   Respiratory: Negative.   Cardiovascular: Negative.   Gastrointestinal: Negative.   Musculoskeletal: Negative.   Neurological: Positive for dizziness.  Psychiatric/Behavioral: Negative.   All other systems reviewed and are negative.   PHYSICAL EXAM: VS:  Ht 5\' 10"  (1.778 m)    Wt 169 lb (76.7 kg)    SpO2 97%    BMI 24.25 kg/m  , BMI Body mass index is 24.25 kg/m. Constitutional:  oriented to person, place, and time. No distress.  HENT:  Head: Grossly normal Eyes:  no discharge. No scleral icterus.  Neck: No JVD, no carotid bruits  Cardiovascular: Regular rate and rhythm, no murmurs appreciated Pulmonary/Chest: Clear to auscultation bilaterally, no  wheezes or rails Abdominal: Soft.  no distension.  no tenderness.  Musculoskeletal: Normal range of motion Neurological:  normal muscle tone. Coordination normal. No atrophy Skin: Skin warm and dry Psychiatric: normal affect, pleasant   Recent Labs: 12/14/2019: ALT 19; BUN 22; Creatinine, Ser 1.08; Hemoglobin 17.2; Platelets 107; Potassium 4.0; Sodium 141    Lipid Panel Lab Results  Component Value Date   CHOL 109 06/04/2014   HDL 23 (L) 06/04/2014   LDLCALC 54 06/04/2014   TRIG 160 06/04/2014      Wt Readings from Last 3 Encounters:  08/29/20 169 lb (76.7 kg)  03/16/20 166 lb 8 oz (75.5 kg)  12/14/19 166 lb (75.3 kg)      ASSESSMENT AND PLAN:  Atypical chest pain Added on prior office visit then went away Recurrent several days ago now gone away again Etiology unclear, Discussed stress testing with echo stress test, cardiac CTA, Myoview Also discussed calcium scoring for risk stratification Discussed echocardiogram -Given atypical nature, he will start with CT coronary calcium scoring This can also evaluate for GI etiology, pericardial thickening, other causes of atypical left-sided chest discomfort  Near syncope - Plan: EKG 12-Lead No recent episodes Blood pressure stable  Other cardiac arrhythmia - Plan: EKG 12-Lead No symptoms or tachypalpitations  Obstruction of right vertebral artery - Plan:  EKG 12-Lead Reviewed prior CT scan neck with him from October 2015 No symptoms, cholesterol at goal   Total encounter time more than 25 minutes  Greater than 50% was spent in counseling and coordination of care with the patient    No orders of the defined types were placed in this encounter.    Signed, Esmond Plants, M.D., Ph.D. 08/29/2020  [place], [place]

## 2020-08-29 ENCOUNTER — Ambulatory Visit: Payer: Medicare PPO | Admitting: Cardiovascular Disease

## 2020-08-29 ENCOUNTER — Other Ambulatory Visit: Payer: Self-pay

## 2020-08-29 ENCOUNTER — Encounter: Payer: Self-pay | Admitting: Cardiovascular Disease

## 2020-08-29 DIAGNOSIS — I493 Ventricular premature depolarization: Secondary | ICD-10-CM | POA: Diagnosis not present

## 2020-08-29 DIAGNOSIS — I10 Essential (primary) hypertension: Secondary | ICD-10-CM | POA: Diagnosis not present

## 2020-08-29 DIAGNOSIS — I739 Peripheral vascular disease, unspecified: Secondary | ICD-10-CM | POA: Diagnosis not present

## 2020-08-29 DIAGNOSIS — I6501 Occlusion and stenosis of right vertebral artery: Secondary | ICD-10-CM | POA: Diagnosis not present

## 2020-08-29 DIAGNOSIS — G458 Other transient cerebral ischemic attacks and related syndromes: Secondary | ICD-10-CM | POA: Diagnosis not present

## 2020-08-29 DIAGNOSIS — I498 Other specified cardiac arrhythmias: Secondary | ICD-10-CM | POA: Diagnosis not present

## 2020-08-29 NOTE — Patient Instructions (Addendum)
1)  We will order CT coronary calcium score  $99 at our [facility] in [place]     Please call [phone] to schedule  [facility]  [address]  [place], [place] [address]    2)  For chest pain: Try higher dose omeprazole, pepcid, soda  You could also try NSAIDS for possible pericarditis  If no relief,  We can order a cardiac CTA   Medication Instructions:  No changes  If you need a refill on your cardiac medications before your next appointment, please call your pharmacy.    Lab work: No new labs needed   If you have labs (blood work) drawn today and your tests are completely normal, you will receive your results only by: Marland Kitchen MyChart Message (if you have MyChart) OR . A paper copy in the mail If you have any lab test that is abnormal or we need to change your treatment, we will call you to review the results.   Testing/Procedures: No new testing needed    Follow-Up: At [facility], you and your health needs are our priority.  As part of our continuing mission to provide you with exceptional heart care, we have created designated Provider Care Teams.  These Care Teams include your primary Cardiologist (physician) and Advanced Practice Providers (APPs -  Physician Assistants and Nurse Practitioners) who all work together to provide you with the care you need, when you need it.  . You will need a follow up appointment one year  . Providers on your designated Care Team:   . Nicolasa Ducking, NP . Eula Listen, PA-C . Marisue Ivan, PA-C  Any Other Special Instructions Will Be Listed Below (If Applicable).  COVID-19 Vaccine Information can be found at: [email or web address] For questions related to vaccine distribution or appointments, please email [email or web address] or call [phone].

## 2020-09-07 ENCOUNTER — Ambulatory Visit
Admission: RE | Admit: 2020-09-07 | Discharge: 2020-09-07 | Disposition: A | Discharge status: Home / Self Care | Payer: Medicare PPO | Source: Ambulatory Visit | Attending: Cardiovascular Disease | Admitting: Cardiovascular Disease

## 2020-09-07 ENCOUNTER — Other Ambulatory Visit: Payer: Self-pay

## 2020-09-07 DIAGNOSIS — I10 Essential (primary) hypertension: Secondary | ICD-10-CM

## 2020-09-12 ENCOUNTER — Telehealth: Payer: Self-pay

## 2020-09-12 NOTE — Telephone Encounter (Signed)
Able to reach pt regarding his recent  CT calcium score, Dr. Rockey Situ had a chance to review his results and advised   "CT coronary calcium score  Very low score, overall good news  No other acute findings"  Pt is delighted with the results, no concerns at this time, will call back for anything further.

## 2020-09-26 NOTE — Progress Notes (Unsigned)
Cardiology Office Note  Date:  09/27/2020   ID:  [name], DOB [birth year], MRN [id number]  PCP:  [name], MD   Chief Complaint  Patient presents with  . Follow-up    Per patient this is a follow up visit from last month, he does not have any issues at the present time he would like to discuss. Medications verbally reviewed with patient.     HPI:  87 y.o. male with hx of  Former smoker, quit 1962 Atypical chest pain 2 years, at rest, not with exertion presyncopal episode while sitting in his car at a stop light on 06/03/2014.  hospital with significant workup including EKG, carotid ultrasound showing no plaque, Reversal of flow in the right vertebral artery suggesting subclavian steal syndrome and proximal right subclavian artery stenosis or occlusion, Echocardiogram with normal ejection fraction, Telemetry in the hospital showing PVCs and APCs He presents today for follow-up of his near syncope, vertebral artery disease and ectopy on Holter  Last seen 08/2020  CT coronary calcium scoring Score of 54, 10th percentile, LIMA LAD Images pulled up and reviewed with him in detail  Was having some atypical chest pain on prior clinic visit Denies any further chest pain  Continues to follow with oncology for chronic isolated thrombocytopenia  Continues to be very active, exercise, biking on the roads For GERD symptoms takes omeprazole  LDL in the 60s, total cholesterol less than 130  Other past medical history  Previous Holter had shown PVCs and APCs, sometimes short runs of atrialcouplets and triplets.    PMH:   has a past medical history of Adenomatous polyp (2012), Allergic rhinitis, Arrhythmia, Barrett esophagus (08/12/12, 12/26/10, 06/21/08, 04/20/06, 04/11/03, 01/05/03), Cancer [facility]), Cardiac arrhythmia, Cataract, Chicken pox, Colon polyp (12/29/10, 04/18/06, 01/05/03), Diplopia, Dysrhythmia, Essential hypertension, Exotropia, left eye, GERD (gastroesophageal reflux  disease), Gout, H/O: CVA (cerebrovascular accident), Hemorrhoid, Hepatic cyst, History of kidney stones, Low testosterone, Melanoma in situ ([place]), Migraine, Near syncope, Nephrolithiasis, Nephrolithiasis, Occlusion and stenosis of vertebral artery, Psoriasis, Reflux, Splenomegaly, Strabismus, Subclavian steal syndrome, Syncope and collapse, Thrombocytopenia ([place]), Ventricular premature depolarization, and Vertebral artery stenosis.  PSH:    Past Surgical History:  Procedure Laterality Date  . APPENDECTOMY    . COLONOSCOPY    . COLONOSCOPY WITH PROPOFOL N/A 03/13/2016   Procedure: COLONOSCOPY WITH PROPOFOL;  Surgeon: Lollie Sails, MD;  Location: [facility] ENDOSCOPY;  Service: Endoscopy;  Laterality: N/A;  . diplopia    . ESOPHAGOGASTRODUODENOSCOPY  08/2014, 06/14/2012  . ESOPHAGOGASTRODUODENOSCOPY (EGD) WITH PROPOFOL N/A 03/13/2016   Procedure: ESOPHAGOGASTRODUODENOSCOPY (EGD) WITH PROPOFOL;  Surgeon: Lollie Sails, MD;  Location: [facility] ENDOSCOPY;  Service: Endoscopy;  Laterality: N/A;  . ESOPHAGOGASTRODUODENOSCOPY (EGD) WITH PROPOFOL N/A 07/15/2018   Procedure: ESOPHAGOGASTRODUODENOSCOPY (EGD) WITH PROPOFOL;  Surgeon: Lollie Sails, MD;  Location: [facility] ENDOSCOPY;  Service: Endoscopy;  Laterality: N/A;  . EYE SURGERY  08/16/2003   eye sug  . MOHS SURGERY  2011, 2012   left ear and right cheek  . STRABISMUS SURGERY  10/20/2015   2 horizontal muscles-left  . TONSILLECTOMY    . WISDOM TOOTH EXTRACTION      Current Outpatient Medications  Medication Sig Dispense Refill  . calcium-vitamin D (OSCAL WITH D) 500-200 MG-UNIT tablet Take 1 tablet by mouth daily with breakfast.    . desonide (DESOWEN) 0.05 % cream     . finasteride (PROSCAR) 5 MG tablet Take 1 tablet (5 mg total) by mouth daily. 90 tablet 2  . Multiple Vitamin (  MULTI-VITAMINS) TABS Take by mouth daily.     Marland Kitchen omeprazole (PRILOSEC) 20 MG capsule Take 20 mg by mouth daily.    . tacrolimus (PROTOPIC) 0.1 % ointment     .  Testosterone 20.25 MG/ACT (1.62%) GEL Apply to shoulders and/or upper arms only 5 grams    . valACYclovir (VALTREX) 1000 MG tablet Take 500 mg by mouth daily.      No current facility-administered medications for this visit.    Allergies:   Other   Social History:  The patient  reports that he quit smoking about 59 years ago. His smoking use included cigarettes. He has a 2.50 pack-year smoking history. He has never used smokeless tobacco. He reports current alcohol use. He reports that he does not use drugs.   Family History:   family history includes Alcohol abuse in his father; Diabetes Mellitus II in his father; Gout in his father; Heart attack in his father; Heart failure in his mother; Osteoporosis in his maternal grandfather, maternal grandmother, and mother; Renal Disease in his father; Stroke in his mother.    Review of Systems: Review of Systems  Constitutional: Negative.   HENT: Negative.   Respiratory: Negative.   Cardiovascular: Negative.   Gastrointestinal: Negative.   Musculoskeletal: Negative.   Neurological: Negative.   Psychiatric/Behavioral: Negative.   All other systems reviewed and are negative.   PHYSICAL EXAM: VS:  BP 138/68 (BP Location: Left Arm, Patient Position: Sitting, Cuff Size: Normal)   Pulse 66   Ht 5\' 10"  (1.778 m)   Wt 164 lb (74.4 kg)   SpO2 96%   BMI 23.53 kg/m  , BMI Body mass index is 23.53 kg/m. Constitutional:  oriented to person, place, and time. No distress.  HENT:  Head: Grossly normal Eyes:  no discharge. No scleral icterus.  Neck: No JVD, no carotid bruits  Cardiovascular: Regular rate and rhythm, no murmurs appreciated Pulmonary/Chest: Clear to auscultation bilaterally, no wheezes or rails Abdominal: Soft.  no distension.  no tenderness.  Musculoskeletal: Normal range of motion Neurological:  normal muscle tone. Coordination normal. No atrophy Skin: Skin warm and dry Psychiatric: normal affect, pleasant   Recent  Labs: 12/14/2019: ALT 19; BUN 22; Creatinine, Ser 1.08; Hemoglobin 17.2; Platelets 107; Potassium 4.0; Sodium 141    Lipid Panel Lab Results  Component Value Date   CHOL 109 06/04/2014   HDL 23 (L) 06/04/2014   LDLCALC 54 06/04/2014   TRIG 160 06/04/2014      Wt Readings from Last 3 Encounters:  09/27/20 164 lb (74.4 kg)  08/29/20 169 lb (76.7 kg)  03/16/20 166 lb 8 oz (75.5 kg)      ASSESSMENT AND PLAN:  Atypical chest pain No further episodes CT coronary calcium scoring, very low score, images pulled up and discussed  Coronary calcification on CT Aggressive medical management recommended, we will add Zetia Very active, biking, will try to avoid statins  Near syncope - Plan: EKG 12-Lead Blood pressure stable, no recent episodes  Other cardiac arrhythmia - Plan: EKG 12-Lead No recent tachypalpitations, No changes to medications  Obstruction of right vertebral artery - Plan: EKG 12-Lead Reviewed prior CT scan neck with him from October 2015 Zetia started Goal LDL 60 or less   Total encounter time more than 25 minutes  Greater than 50% was spent in counseling and coordination of care with the patient    No orders of the defined types were placed in this encounter.    Signed, Esmond Plants, M.D.,  Ph.D. 09/27/2020  [place], [place]

## 2020-09-27 ENCOUNTER — Encounter: Payer: Self-pay | Admitting: Cardiovascular Disease

## 2020-09-27 ENCOUNTER — Other Ambulatory Visit: Payer: Self-pay

## 2020-09-27 ENCOUNTER — Ambulatory Visit: Payer: Medicare PPO | Admitting: Cardiovascular Disease

## 2020-09-27 VITALS — BP 138/68 | HR 66 | Ht 70.0 in | Wt 164.0 lb

## 2020-09-27 DIAGNOSIS — G458 Other transient cerebral ischemic attacks and related syndromes: Secondary | ICD-10-CM | POA: Diagnosis not present

## 2020-09-27 DIAGNOSIS — I10 Essential (primary) hypertension: Secondary | ICD-10-CM | POA: Diagnosis not present

## 2020-09-27 DIAGNOSIS — I493 Ventricular premature depolarization: Secondary | ICD-10-CM | POA: Diagnosis not present

## 2020-09-27 DIAGNOSIS — I739 Peripheral vascular disease, unspecified: Secondary | ICD-10-CM | POA: Diagnosis not present

## 2020-09-27 DIAGNOSIS — R55 Syncope and collapse: Secondary | ICD-10-CM

## 2020-09-27 MED ORDER — EZETIMIBE 10 MG PO TABS: 10.0000 mg | ORAL_TABLET | Freq: Every day | ORAL | 3 refills | Status: DC

## 2020-09-27 NOTE — Patient Instructions (Signed)
Medication Instructions:  zetia 10 mg daily for cholesterol  If you need a refill on your cardiac medications before your next appointment, please call your pharmacy.    Lab work: No new labs needed   If you have labs (blood work) drawn today and your tests are completely normal, you will receive your results only by: . MyChart Message (if you have MyChart) OR . A paper copy in the mail If you have any lab test that is abnormal or we need to change your treatment, we will call you to review the results.   Testing/Procedures: No new testing needed   Follow-Up: At [facility], you and your health needs are our priority.  As part of our continuing mission to provide you with exceptional heart care, we have created designated Provider Care Teams.  These Care Teams include your primary Cardiologist (physician) and Advanced Practice Providers (APPs -  Physician Assistants and Nurse Practitioners) who all work together to provide you with the care you need, when you need it.  . You will need a follow up appointment in 12 months  . Providers on your designated Care Team:   . Christopher Berge, NP . Ryan Dunn, PA-C . Jacquelyn Visser, PA-C  Any Other Special Instructions Will Be Listed Below (If Applicable).  COVID-19 Vaccine Information can be found at: [email or web address] For questions related to vaccine distribution or appointments, please email [email or web address] or call [phone].     

## 2020-10-10 ENCOUNTER — Encounter: Payer: Self-pay | Admitting: Urology

## 2020-10-10 ENCOUNTER — Other Ambulatory Visit: Payer: Self-pay

## 2020-10-10 ENCOUNTER — Ambulatory Visit (INDEPENDENT_AMBULATORY_CARE_PROVIDER_SITE_OTHER): Payer: Medicare PPO | Admitting: Urology

## 2020-10-10 VITALS — BP 167/82 | HR 72 | Ht 70.0 in | Wt 165.0 lb

## 2020-10-10 DIAGNOSIS — N401 Enlarged prostate with lower urinary tract symptoms: Secondary | ICD-10-CM | POA: Diagnosis not present

## 2020-10-10 LAB — BLADDER SCAN AMB NON-IMAGING: Scan Result: 4

## 2020-10-10 MED ORDER — FINASTERIDE 5 MG PO TABS: 5.0000 mg | ORAL_TABLET | Freq: Every day | ORAL | 3 refills | Status: DC

## 2020-10-10 NOTE — Progress Notes (Signed)
10/10/2020 9:37 AM   [name] [birth year] [id number]  Referring provider: [name], MD [phone] [place],  [place] [address]  Chief Complaint  Patient presents with  . Benign Prostatic Hypertrophy     Urologic history: 1.  BPH with LUTS -Finasteride; tamsulosin discontinued 10/2019 due to orthostatic symptoms -Cystoscopy 02/2018 elevated bladder neck, trilobar enlargement   HPI: 87 y.o. male presents for annual follow-up   At last years visit he was complaining of increased orthostatic symptoms and it was recommended he discontinue his tamsulosin and would give a trial of silodosin if his voiding symptoms worsen  He did discontinue the medication and his dizziness resolved.  He has seen no worsening in his lower urinary tract symptoms on finasteride alone  Denies dysuria, gross hematuria  Denies flank, abdominal or pelvic pain   PMH: Past Medical History:  Diagnosis Date  . Adenomatous polyp 2012  . Allergic rhinitis   . Arrhythmia   . 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 Cancer ([place])    melanoma  . Cardiac arrhythmia   . Cataract   . Chicken pox   . Colon polyp 12/29/10, 04/18/06, 01/05/03  . Diplopia   . Dysrhythmia   . Essential hypertension   . Exotropia, left eye   . GERD (gastroesophageal reflux disease)   . Gout   . H/O: CVA (cerebrovascular accident)   . Hemorrhoid   . Hepatic cyst   . History of kidney stones   . Low testosterone   . Melanoma in situ ([place])    left ear, right cheek  . Migraine   . Near syncope   . Nephrolithiasis   . Nephrolithiasis   . Occlusion and stenosis of vertebral artery   . Psoriasis   . Reflux   . Splenomegaly   . Strabismus   . Subclavian steal syndrome   . Syncope and collapse   . Thrombocytopenia ([place])   . Ventricular premature depolarization   . Vertebral artery stenosis     Surgical History: Past Surgical History:  Procedure Laterality Date  .  APPENDECTOMY    . COLONOSCOPY    . COLONOSCOPY WITH PROPOFOL N/A 03/13/2016   Procedure: COLONOSCOPY WITH PROPOFOL;  Surgeon: Lollie Sails, MD;  Location: [facility] ENDOSCOPY;  Service: Endoscopy;  Laterality: N/A;  . diplopia    . ESOPHAGOGASTRODUODENOSCOPY  08/2014, 06/14/2012  . ESOPHAGOGASTRODUODENOSCOPY (EGD) WITH PROPOFOL N/A 03/13/2016   Procedure: ESOPHAGOGASTRODUODENOSCOPY (EGD) WITH PROPOFOL;  Surgeon: Lollie Sails, MD;  Location: [facility] ENDOSCOPY;  Service: Endoscopy;  Laterality: N/A;  . ESOPHAGOGASTRODUODENOSCOPY (EGD) WITH PROPOFOL N/A 07/15/2018   Procedure: ESOPHAGOGASTRODUODENOSCOPY (EGD) WITH PROPOFOL;  Surgeon: Lollie Sails, MD;  Location[facility] ENDOSCOPY;  Service: Endoscopy;  Laterality: N/A;  . EYE SURGERY  08/16/2003   eye sug  . MOHS SURGERY  2011, 2012   left ear and right cheek  . STRABISMUS SURGERY  10/20/2015   2 horizontal muscles-left  . TONSILLECTOMY    . WISDOM TOOTH EXTRACTION      Home Medications:  Allergies as of 10/10/2020      Reactions   Other Diarrhea   Other reaction(s): Abdominal Pain tarragon Other reaction(s): Abdominal Pain tarragon      Medication List       Accurate as of October 10, 2020  9:37 AM. If you have any questions, ask your nurse or doctor.        calcium-vitamin D 500-200 MG-UNIT tablet Commonly known as: OSCAL WITH  D Take 1 tablet by mouth daily with breakfast.   desonide 0.05 % cream Commonly known as: DESOWEN   ezetimibe 10 MG tablet Commonly known as: ZETIA Take 1 tablet (10 mg total) by mouth daily.   finasteride 5 MG tablet Commonly known as: PROSCAR Take 1 tablet (5 mg total) by mouth daily.   Multi-Vitamins Tabs Take by mouth daily.   omeprazole 20 MG capsule Commonly known as: PRILOSEC Take 20 mg by mouth daily.   tacrolimus 0.1 % ointment Commonly known as: PROTOPIC   Testosterone 20.25 MG/ACT (1.62%) Gel Apply to shoulders and/or upper arms only 5 grams   valACYclovir 1000 MG  tablet Commonly known as: VALTREX Take 500 mg by mouth daily.       Allergies:  Allergies  Allergen Reactions  . Other Diarrhea    Other reaction(s): Abdominal Pain tarragon Other reaction(s): Abdominal Pain tarragon    Family History: Family History  Problem Relation Age of Onset  . Heart failure Mother   . Osteoporosis Mother   . Stroke Mother   . Heart attack Father   . Gout Father   . Diabetes Mellitus II Father   . Renal Disease Father   . Alcohol abuse Father   . Osteoporosis Maternal Grandfather   . Osteoporosis Maternal Grandmother     Social History:  reports that he quit smoking about 59 years ago. His smoking use included cigarettes. He has a 2.50 pack-year smoking history. He has never used smokeless tobacco. He reports current alcohol use. He reports that he does not use drugs.   Physical Exam: BP (!) 167/82   Pulse 72   Ht 5\' 10"  (1.778 m)   Wt 165 lb (74.8 kg)   BMI 23.68 kg/m   Constitutional:  Alert and oriented, No acute distress. HEENT: [place] AT, moist mucus membranes.  Trachea midline, no masses. Cardiovascular: No clubbing, cyanosis, or edema. Respiratory: Normal respiratory effort, no increased work of breathing. Skin: No rashes, bruises or suspicious lesions. Neurologic: Grossly intact, no focal deficits, moving all 4 extremities. Psychiatric: Normal mood and affect.    Assessment & Plan:    1. Benign prostatic hyperplasia with lower urinary tract symptoms  Stable lower urinary tract symptoms on finasteride; no worsening after tamsulosin DCed  Bladder scan PVR 4 mL  Urinalysis was unremarkable  Finasteride refilled  Continue annual follow-up   Abbie Sons, MD  [facility] [address], [place], [place] [address] [phone]

## 2020-10-11 LAB — MICROSCOPIC EXAMINATION
Bacteria, UA: NONE SEEN
RBC, Urine: NONE SEEN /hpf (ref 0–2)

## 2020-10-11 LAB — URINALYSIS, COMPLETE
Bilirubin, UA: NEGATIVE
Glucose, UA: NEGATIVE
Ketones, UA: NEGATIVE
Leukocytes,UA: NEGATIVE
Nitrite, UA: NEGATIVE
Protein,UA: NEGATIVE
RBC, UA: NEGATIVE
Specific Gravity, UA: 1.025 (ref 1.005–1.030)
Urobilinogen, Ur: 0.2 mg/dL (ref 0.2–1.0)
pH, UA: 5 (ref 5.0–7.5)

## 2020-12-13 ENCOUNTER — Other Ambulatory Visit: Payer: Self-pay

## 2020-12-13 ENCOUNTER — Inpatient Hospital Stay: Payer: Medicare PPO | Admitting: Internal Medicine

## 2020-12-13 ENCOUNTER — Inpatient Hospital Stay: Payer: Medicare PPO | Attending: Internal Medicine

## 2020-12-13 ENCOUNTER — Encounter: Payer: Self-pay | Admitting: Internal Medicine

## 2020-12-13 DIAGNOSIS — D696 Thrombocytopenia, unspecified: Secondary | ICD-10-CM

## 2020-12-13 DIAGNOSIS — Z8262 Family history of osteoporosis: Secondary | ICD-10-CM | POA: Diagnosis not present

## 2020-12-13 DIAGNOSIS — Z8719 Personal history of other diseases of the digestive system: Secondary | ICD-10-CM | POA: Insufficient documentation

## 2020-12-13 DIAGNOSIS — Z85820 Personal history of malignant melanoma of skin: Secondary | ICD-10-CM | POA: Insufficient documentation

## 2020-12-13 DIAGNOSIS — Z9049 Acquired absence of other specified parts of digestive tract: Secondary | ICD-10-CM | POA: Insufficient documentation

## 2020-12-13 DIAGNOSIS — Z87891 Personal history of nicotine dependence: Secondary | ICD-10-CM | POA: Insufficient documentation

## 2020-12-13 DIAGNOSIS — Z79899 Other long term (current) drug therapy: Secondary | ICD-10-CM | POA: Diagnosis not present

## 2020-12-13 DIAGNOSIS — Z841 Family history of disorders of kidney and ureter: Secondary | ICD-10-CM | POA: Insufficient documentation

## 2020-12-13 DIAGNOSIS — L409 Psoriasis, unspecified: Secondary | ICD-10-CM | POA: Insufficient documentation

## 2020-12-13 DIAGNOSIS — I10 Essential (primary) hypertension: Secondary | ICD-10-CM | POA: Insufficient documentation

## 2020-12-13 DIAGNOSIS — R161 Splenomegaly, not elsewhere classified: Secondary | ICD-10-CM | POA: Diagnosis not present

## 2020-12-13 DIAGNOSIS — Z8249 Family history of ischemic heart disease and other diseases of the circulatory system: Secondary | ICD-10-CM | POA: Insufficient documentation

## 2020-12-13 DIAGNOSIS — Z811 Family history of alcohol abuse and dependence: Secondary | ICD-10-CM | POA: Insufficient documentation

## 2020-12-13 DIAGNOSIS — Z8673 Personal history of transient ischemic attack (TIA), and cerebral infarction without residual deficits: Secondary | ICD-10-CM | POA: Diagnosis not present

## 2020-12-13 DIAGNOSIS — Z833 Family history of diabetes mellitus: Secondary | ICD-10-CM | POA: Insufficient documentation

## 2020-12-13 LAB — COMPREHENSIVE METABOLIC PANEL
ALT: 23 U/L (ref 0–44)
AST: 30 U/L (ref 15–41)
Albumin: 4.5 g/dL (ref 3.5–5.0)
Alkaline Phosphatase: 53 U/L (ref 38–126)
Anion gap: 9 (ref 5–15)
BUN: 27 mg/dL — ABNORMAL HIGH (ref 8–23)
CO2: 29 mmol/L (ref 22–32)
Calcium: 8.9 mg/dL (ref 8.9–10.3)
Chloride: 103 mmol/L (ref 98–111)
Creatinine, Ser: 1.17 mg/dL (ref 0.61–1.24)
GFR, Estimated: >60 mL/min (ref >60)
Glucose, Bld: 121 mg/dL — ABNORMAL HIGH (ref 70–99)
Potassium: 4.3 mmol/L (ref 3.5–5.1)
Sodium: 141 mmol/L (ref 135–145)
Total Bilirubin: 1.1 mg/dL (ref 0.3–1.2)
Total Protein: 6.6 g/dL (ref 6.5–8.1)

## 2020-12-13 LAB — CBC WITH DIFFERENTIAL/PLATELET
Abs Immature Granulocytes: 0.02 10*3/uL (ref 0.00–0.07)
Basophils Absolute: 0 10*3/uL (ref 0.0–0.1)
Basophils Relative: 0 %
Eosinophils Absolute: 0.2 10*3/uL (ref 0.0–0.5)
Eosinophils Relative: 3 %
HCT: 46.3 % (ref 39.0–52.0)
Hemoglobin: 16.2 g/dL (ref 13.0–17.0)
Immature Granulocytes: 0 %
Lymphocytes Relative: 36 %
Lymphs Abs: 1.7 10*3/uL (ref 0.7–4.0)
MCH: 32.7 pg (ref 26.0–34.0)
MCHC: 35 g/dL (ref 30.0–36.0)
MCV: 93.5 fL (ref 80.0–100.0)
Monocytes Absolute: 0.3 10*3/uL (ref 0.1–1.0)
Monocytes Relative: 7 %
Neutro Abs: 2.5 10*3/uL (ref 1.7–7.7)
Neutrophils Relative %: 54 %
Platelets: 102 10*3/uL — ABNORMAL LOW (ref 150–400)
RBC: 4.95 MIL/uL (ref 4.22–5.81)
RDW: 13.1 % (ref 11.5–15.5)
WBC: 4.7 10*3/uL (ref 4.0–10.5)
nRBC: 0 % (ref 0.0–0.2)

## 2020-12-13 LAB — LACTATE DEHYDROGENASE: LDH: 149 U/L (ref 98–192)

## 2020-12-13 NOTE — Assessment & Plan Note (Addendum)
#  Chronic thrombocytopenia isolated- > 100 [since 2015]-platelets today 102.  Clinically suspicious of ITP [although mild splenomegaly]; less likely from alcohol/MDS.  No bone marrow biopsy.  # Psoriasis- on MXT- STABLE.   # As patient is currently asymptomatic.  Continue monitoring; patient has appointment with PCP in 6 months.  # DISPOSITION:  # follow up in 12 months- MD/labs-cbc/cmp/ldh- Dr.B  Cc; Dr.Bronstein

## 2020-12-13 NOTE — Progress Notes (Signed)
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  male here for follow-up because of his thrombocytopenia.   Patient stated he has been diagnosed with psoriasis started on methotrexate.  Patient stopped taking alcohol for the last 1 month.  He continues to be physically active.  Denies any blood in stools or black or stools but denies any bruising.  Review of Systems  Constitutional: Negative for chills, diaphoresis, fever, malaise/fatigue and weight loss.  HENT: Negative for nosebleeds and sore throat.   Eyes: Negative for double vision.  Respiratory: Negative for cough, hemoptysis, sputum production, shortness of breath and wheezing.   Cardiovascular: Negative for chest pain, palpitations, orthopnea and leg swelling.  Gastrointestinal: Negative for abdominal pain, blood in stool, constipation, diarrhea, heartburn, melena, nausea and vomiting.  Genitourinary: Negative for dysuria, frequency and urgency.  Musculoskeletal: Negative for back pain and joint pain.  Skin: Negative.  Negative for itching and rash.  Neurological: Negative for dizziness, tingling, focal weakness, weakness and headaches.  Endo/Heme/Allergies: Does not bruise/bleed easily.  Psychiatric/Behavioral: Negative for depression. The patient is not nervous/anxious and does not have insomnia.      MEDICAL HISTORY:  Past Medical History:  Diagnosis Date  . Adenomatous polyp 2012  . Allergic rhinitis   . Arrhythmia   . Barrett  esophagus 08/12/12, 12/26/10, 06/21/08, 04/20/06, 04/11/03, 01/05/03  . Cancer ([facility])    melanoma  . Cardiac arrhythmia   . Cataract   . Chicken pox   . Colon polyp 12/29/10, 04/18/06, 01/05/03  . Diplopia   . Dysrhythmia   . Essential hypertension   . Exotropia, left eye   . GERD (gastroesophageal reflux disease)   . Gout   . H/O: CVA (cerebrovascular accident)   . Hemorrhoid   . Hepatic cyst   . History of kidney stones   . Low testosterone   . Melanoma in situ ([facility])    left ear, right cheek  . Migraine   . Near syncope   . Nephrolithiasis   . Nephrolithiasis   . Occlusion and stenosis of vertebral artery   . Psoriasis   . Reflux   . Splenomegaly   . Strabismus   . Subclavian steal syndrome   . Syncope and collapse   . Thrombocytopenia ([facility])   . Ventricular premature depolarization   . Vertebral artery stenosis     SURGICAL HISTORY: Past Surgical History:  Procedure Laterality Date  . APPENDECTOMY    . COLONOSCOPY    . COLONOSCOPY WITH PROPOFOL N/A 03/13/2016   Procedure: COLONOSCOPY WITH PROPOFOL;  Surgeon: Christena Deem, MD;  Location: [facility] ENDOSCOPY;  Service: Endoscopy;  Laterality: N/A;  . diplopia    . ESOPHAGOGASTRODUODENOSCOPY  08/2014, 06/14/2012  . ESOPHAGOGASTRODUODENOSCOPY (EGD) WITH PROPOFOL N/A 03/13/2016   Procedure: ESOPHAGOGASTRODUODENOSCOPY (EGD) WITH PROPOFOL;  Surgeon: Christena Deem, MD;  Location: [facility] ENDOSCOPY;  Service: Endoscopy;  Laterality: N/A;  . ESOPHAGOGASTRODUODENOSCOPY (EGD) WITH PROPOFOL N/A 07/15/2018   Procedure: ESOPHAGOGASTRODUODENOSCOPY (EGD) WITH PROPOFOL;  Surgeon: Christena Deem, MD;  Location: [facility] ENDOSCOPY;  Service: Endoscopy;  Laterality: N/A;  . EYE SURGERY  08/16/2003   eye sug  . MOHS SURGERY  2011, 2012   left ear and right cheek  . STRABISMUS SURGERY  10/20/2015   2 horizontal muscles-left  . TONSILLECTOMY    . WISDOM TOOTH EXTRACTION      SOCIAL HISTORY: Social History   Socioeconomic History  .  Marital status: Married    Spouse name: Not on file  . Number of children: Not on file  . Years of education: Not on file  . Highest education level: Not on file  Occupational History  . Not on file  Tobacco Use  . Smoking status: Former Smoker    Packs/day: 0.25    Years: 10.00    Pack years: 2.50    Types: Cigarettes    Quit date: 01/30/1961    Years since quitting: 59.9  . Smokeless tobacco: Never Used  Vaping Use  . Vaping Use: Never used  Substance and Sexual Activity  . Alcohol use: Yes    Alcohol/week: 0.0 standard drinks    Comment: social drinker  . Drug use: No  . Sexual activity: Yes  Other Topics Concern  . 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   Social Determinants of Radio broadcast assistant Strain: Not on file  Food Insecurity: Not on file  Transportation Needs: Not on file  Physical Activity: Not on file  Stress: Not on file  Social Connections: Not on file  Intimate Partner Violence: Not on file    FAMILY HISTORY: Family History  Problem Relation Age of Onset  . Heart failure Mother   . Osteoporosis Mother   . Stroke Mother   . Heart attack Father   . Gout Father   . Diabetes Mellitus II Father   . Renal Disease Father   . Alcohol abuse Father   . Osteoporosis Maternal Grandfather   . Osteoporosis Maternal Grandmother     ALLERGIES:  is allergic to other.  MEDICATIONS:  Current Outpatient Medications  Medication Sig Dispense Refill  . calcium-vitamin D (OSCAL WITH D) 500-200 MG-UNIT tablet Take 1 tablet by mouth daily with breakfast.    . desonide (DESOWEN) 0.05 % cream Apply 1 application topically daily as needed.    . ezetimibe (ZETIA) 10 MG tablet Take 1 tablet (10 mg total) by mouth daily. 90 tablet 3  . finasteride (PROSCAR) 5 MG tablet Take 1 tablet (5 mg total) by mouth daily. 90 tablet 3  . folic acid (FOLVITE) 1  MG tablet Take 1 tablet by mouth. 6 times a week    . methotrexate 2.5 MG tablet Take 5 tablets by mouth once a week.    . Multiple Vitamin (MULTI-VITAMINS) TABS Take by mouth daily.     Marland Kitchen omeprazole (PRILOSEC) 20 MG capsule Take 20 mg by mouth daily.    . tacrolimus (PROTOPIC) 0.1 % ointment     . Testosterone 20.25 MG/ACT (1.62%) GEL Apply to shoulders and/or upper arms only 5 grams    . valACYclovir (VALTREX) 1000 MG tablet Take 500 mg by mouth daily.      No current facility-administered medications for this visit.      Marland Kitchen  PHYSICAL EXAMINATION:   Vitals:   12/13/20 1328  BP: 136/74  Pulse: 68  Resp: 20  Temp: 97.6 F (36.4 C)   Filed Weights   12/13/20 1328  Weight: 162 lb 3.2  oz (73.6 kg)    Physical Exam HENT:     Head: Normocephalic and atraumatic.     Mouth/Throat:     Pharynx: No oropharyngeal exudate.  Eyes:     Pupils: Pupils are equal, round, and reactive to light.  Cardiovascular:     Rate and Rhythm: Normal rate and regular rhythm.  Pulmonary:     Effort: No respiratory distress.     Breath sounds: No wheezing.  Abdominal:     General: Bowel sounds are normal. There is no distension.     Palpations: Abdomen is soft. There is no mass.     Tenderness: There is no abdominal tenderness. There is no guarding or rebound.  Musculoskeletal:        General: No tenderness. Normal range of motion.     Cervical back: Normal range of motion and neck supple.  Skin:    General: Skin is warm.  Neurological:     Mental Status: He is alert and oriented to person, place, and time.  Psychiatric:        Mood and Affect: Affect normal.      LABORATORY DATA:  I have reviewed the data as listed Lab Results  Component Value Date   WBC 4.7 12/13/2020   HGB 16.2 12/13/2020   HCT 46.3 12/13/2020   MCV 93.5 12/13/2020   PLT 102 (L) 12/13/2020   Recent Labs    12/13/20 1313  NA 141  K 4.3  CL 103  CO2 29  GLUCOSE 121*  BUN 27*  CREATININE 1.17  CALCIUM  8.9  GFRNONAA >60  PROT 6.6  ALBUMIN 4.5  AST 30  ALT 23  ALKPHOS 53  BILITOT 1.1     ASSESSMENT & PLAN:   Thrombocytopenia ([facility]) # Chronic thrombocytopenia isolated- > 100 [since 2015]-platelets today 102.  Clinically suspicious of ITP [although mild splenomegaly]; less likely from alcohol/MDS.  No bone marrow biopsy.  # Psoriasis- on MXT- STABLE.   # As patient is currently asymptomatic.  Continue monitoring; patient has appointment with PCP in 6 months.  # DISPOSITION:  # follow up in 12 months- MD/labs-cbc/cmp/ldh- Dr.B  Cc; Dr.Bronstein    Cammie Sickle, MD 12/13/2020 7:51 PM

## 2021-04-08 ENCOUNTER — Emergency Department: Payer: Medicare PPO

## 2021-04-08 ENCOUNTER — Other Ambulatory Visit: Payer: Self-pay

## 2021-04-08 ENCOUNTER — Inpatient Hospital Stay
Admission: EM | Admit: 2021-04-08 | Discharge: 2021-04-12 | DRG: 177 | Disposition: A | Discharge status: Home / Self Care | Payer: Medicare PPO | Attending: Internal Medicine | Admitting: Internal Medicine

## 2021-04-08 DIAGNOSIS — E785 Hyperlipidemia, unspecified: Secondary | ICD-10-CM | POA: Diagnosis present

## 2021-04-08 DIAGNOSIS — Z8673 Personal history of transient ischemic attack (TIA), and cerebral infarction without residual deficits: Secondary | ICD-10-CM | POA: Diagnosis not present

## 2021-04-08 DIAGNOSIS — I10 Essential (primary) hypertension: Secondary | ICD-10-CM | POA: Diagnosis present

## 2021-04-08 DIAGNOSIS — K219 Gastro-esophageal reflux disease without esophagitis: Secondary | ICD-10-CM | POA: Diagnosis present

## 2021-04-08 DIAGNOSIS — Z79899 Other long term (current) drug therapy: Secondary | ICD-10-CM

## 2021-04-08 DIAGNOSIS — N400 Enlarged prostate without lower urinary tract symptoms: Secondary | ICD-10-CM | POA: Diagnosis present

## 2021-04-08 DIAGNOSIS — J9601 Acute respiratory failure with hypoxia: Secondary | ICD-10-CM | POA: Diagnosis present

## 2021-04-08 DIAGNOSIS — Z87891 Personal history of nicotine dependence: Secondary | ICD-10-CM

## 2021-04-08 DIAGNOSIS — Z8619 Personal history of other infectious and parasitic diseases: Secondary | ICD-10-CM

## 2021-04-08 DIAGNOSIS — Z8249 Family history of ischemic heart disease and other diseases of the circulatory system: Secondary | ICD-10-CM | POA: Diagnosis not present

## 2021-04-08 DIAGNOSIS — Z86006 Personal history of melanoma in-situ: Secondary | ICD-10-CM

## 2021-04-08 DIAGNOSIS — Z91018 Allergy to other foods: Secondary | ICD-10-CM

## 2021-04-08 DIAGNOSIS — J1282 Pneumonia due to coronavirus disease 2019: Secondary | ICD-10-CM | POA: Diagnosis present

## 2021-04-08 DIAGNOSIS — Z862 Personal history of diseases of the blood and blood-forming organs and certain disorders involving the immune mechanism: Secondary | ICD-10-CM

## 2021-04-08 DIAGNOSIS — J9600 Acute respiratory failure, unspecified whether with hypoxia or hypercapnia: Secondary | ICD-10-CM | POA: Diagnosis not present

## 2021-04-08 DIAGNOSIS — D693 Immune thrombocytopenic purpura: Secondary | ICD-10-CM | POA: Diagnosis present

## 2021-04-08 DIAGNOSIS — Z85820 Personal history of malignant melanoma of skin: Secondary | ICD-10-CM | POA: Diagnosis not present

## 2021-04-08 DIAGNOSIS — U071 COVID-19: Principal | ICD-10-CM | POA: Diagnosis present

## 2021-04-08 DIAGNOSIS — R0602 Shortness of breath: Secondary | ICD-10-CM

## 2021-04-08 DIAGNOSIS — R0902 Hypoxemia: Secondary | ICD-10-CM

## 2021-04-08 LAB — CBC WITH DIFFERENTIAL/PLATELET
Abs Immature Granulocytes: 0.03 10*3/uL (ref 0.00–0.07)
Basophils Absolute: 0 10*3/uL (ref 0.0–0.1)
Basophils Relative: 0 %
Eosinophils Absolute: 0 10*3/uL (ref 0.0–0.5)
Eosinophils Relative: 0 %
HCT: 50.1 % (ref 39.0–52.0)
Hemoglobin: 17.5 g/dL — ABNORMAL HIGH (ref 13.0–17.0)
Immature Granulocytes: 1 %
Lymphocytes Relative: 16 %
Lymphs Abs: 1 10*3/uL (ref 0.7–4.0)
MCH: 33.5 pg (ref 26.0–34.0)
MCHC: 34.9 g/dL (ref 30.0–36.0)
MCV: 96 fL (ref 80.0–100.0)
Monocytes Absolute: 0.4 10*3/uL (ref 0.1–1.0)
Monocytes Relative: 6 %
Neutro Abs: 4.8 10*3/uL (ref 1.7–7.7)
Neutrophils Relative %: 77 %
Platelets: 99 10*3/uL — ABNORMAL LOW (ref 150–400)
RBC: 5.22 MIL/uL (ref 4.22–5.81)
RDW: 12.4 % (ref 11.5–15.5)
Smear Review: NORMAL
WBC: 6.3 10*3/uL (ref 4.0–10.5)
nRBC: 0.3 % — ABNORMAL HIGH (ref 0.0–0.2)

## 2021-04-08 LAB — COMPREHENSIVE METABOLIC PANEL
ALT: 20 U/L (ref 0–44)
AST: 26 U/L (ref 15–41)
Albumin: 4.8 g/dL (ref 3.5–5.0)
Alkaline Phosphatase: 50 U/L (ref 38–126)
Anion gap: 4 — ABNORMAL LOW (ref 5–15)
BUN: 19 mg/dL (ref 8–23)
CO2: 30 mmol/L (ref 22–32)
Calcium: 9 mg/dL (ref 8.9–10.3)
Chloride: 105 mmol/L (ref 98–111)
Creatinine, Ser: 1.36 mg/dL — ABNORMAL HIGH (ref 0.61–1.24)
GFR, Estimated: 51 mL/min — ABNORMAL LOW (ref >60)
Glucose, Bld: 127 mg/dL — ABNORMAL HIGH (ref 70–99)
Potassium: 4.5 mmol/L (ref 3.5–5.1)
Sodium: 139 mmol/L (ref 135–145)
Total Bilirubin: 1.5 mg/dL — ABNORMAL HIGH (ref 0.3–1.2)
Total Protein: 7.7 g/dL (ref 6.5–8.1)

## 2021-04-08 LAB — LACTATE DEHYDROGENASE: LDH: 142 U/L (ref 98–192)

## 2021-04-08 LAB — TYPE AND SCREEN
ABO/RH(D): O POS
Antibody Screen: NEGATIVE

## 2021-04-08 LAB — FIBRINOGEN: Fibrinogen: 464 mg/dL (ref 210–475)

## 2021-04-08 LAB — TROPONIN I (HIGH SENSITIVITY)
Troponin I (High Sensitivity): 8 ng/L (ref <18)
Troponin I (High Sensitivity): 9 ng/L (ref <18)

## 2021-04-08 LAB — RESP PANEL BY RT-PCR (FLU A&B, COVID) ARPGX2
Influenza A by PCR: NEGATIVE
Influenza B by PCR: NEGATIVE
SARS Coronavirus 2 by RT PCR: POSITIVE — AB

## 2021-04-08 LAB — C-REACTIVE PROTEIN: CRP: 9.1 mg/dL — ABNORMAL HIGH (ref <1.0)

## 2021-04-08 LAB — PROCALCITONIN: Procalcitonin: <0.1 ng/mL

## 2021-04-08 LAB — FERRITIN: Ferritin: 122 ng/mL (ref 24–336)

## 2021-04-08 LAB — BRAIN NATRIURETIC PEPTIDE: B Natriuretic Peptide: 38.8 pg/mL (ref 0.0–100.0)

## 2021-04-08 LAB — D-DIMER, QUANTITATIVE: D-Dimer, Quant: 0.45 ug/mL-FEU (ref 0.00–0.50)

## 2021-04-08 LAB — HEPATITIS B SURFACE ANTIGEN: Hepatitis B Surface Ag: NONREACTIVE

## 2021-04-08 LAB — LACTIC ACID, PLASMA: Lactic Acid, Venous: 1.2 mmol/L (ref 0.5–1.9)

## 2021-04-08 MED ORDER — FOLIC ACID 1 MG PO TABS
1.0000 mg | ORAL_TABLET | Freq: Every day | ORAL | Status: DC
  Administered 2021-04-10 – 2021-04-12 (×3): 1 mg via ORAL
  Filled 2021-04-08 (×5): qty 1

## 2021-04-08 MED ORDER — ONDANSETRON HCL 4 MG/2ML IJ SOLN: 4.0000 mg | Freq: Four times a day (QID) | INTRAMUSCULAR | Status: DC | PRN

## 2021-04-08 MED ORDER — SODIUM CHLORIDE 0.9% FLUSH: 3.0000 mL | INTRAVENOUS | Status: DC | PRN

## 2021-04-08 MED ORDER — FINASTERIDE 5 MG PO TABS
5.0000 mg | ORAL_TABLET | Freq: Every day | ORAL | Status: DC
  Administered 2021-04-10 – 2021-04-12 (×3): 5 mg via ORAL
  Filled 2021-04-08 (×5): qty 1

## 2021-04-08 MED ORDER — SODIUM CHLORIDE 0.9% FLUSH
3.0000 mL | Freq: Two times a day (BID) | INTRAVENOUS | Status: DC
  Administered 2021-04-08 – 2021-04-11 (×5): 3 mL via INTRAVENOUS

## 2021-04-08 MED ORDER — GUAIFENESIN-DM 100-10 MG/5ML PO SYRP
10.0000 mL | ORAL_SOLUTION | ORAL | Status: DC | PRN
  Filled 2021-04-08: qty 10

## 2021-04-08 MED ORDER — SODIUM CHLORIDE 0.9 % IV SOLN
100.0000 mg | Freq: Once | INTRAVENOUS | Status: DC
  Filled 2021-04-08: qty 20

## 2021-04-08 MED ORDER — SODIUM CHLORIDE 0.9 % IV BOLUS
1000.0000 mL | Freq: Once | INTRAVENOUS | Status: AC
  Administered 2021-04-08: 1000 mL via INTRAVENOUS

## 2021-04-08 MED ORDER — ONDANSETRON HCL 4 MG PO TABS: 4.0000 mg | ORAL_TABLET | Freq: Four times a day (QID) | ORAL | Status: DC | PRN

## 2021-04-08 MED ORDER — ALBUTEROL SULFATE HFA 108 (90 BASE) MCG/ACT IN AERS
2.0000 | INHALATION_SPRAY | Freq: Four times a day (QID) | RESPIRATORY_TRACT | Status: DC
  Administered 2021-04-08 – 2021-04-12 (×13): 2 via RESPIRATORY_TRACT
  Filled 2021-04-08: qty 6.7

## 2021-04-08 MED ORDER — ACETAMINOPHEN 325 MG PO TABS: 650.0000 mg | ORAL_TABLET | Freq: Four times a day (QID) | ORAL | Status: DC | PRN

## 2021-04-08 MED ORDER — EZETIMIBE 10 MG PO TABS
10.0000 mg | ORAL_TABLET | Freq: Every day | ORAL | Status: DC
  Administered 2021-04-10 – 2021-04-12 (×3): 10 mg via ORAL
  Filled 2021-04-08 (×5): qty 1

## 2021-04-08 MED ORDER — SODIUM CHLORIDE 0.9 % IV SOLN: 100.0000 mg | Freq: Every day | INTRAVENOUS | Status: DC

## 2021-04-08 MED ORDER — CALCIUM CARBONATE-VITAMIN D 500-200 MG-UNIT PO TABS
1.0000 | ORAL_TABLET | Freq: Every day | ORAL | Status: DC
  Administered 2021-04-10 – 2021-04-12 (×3): 1 via ORAL
  Filled 2021-04-08 (×4): qty 1

## 2021-04-08 MED ORDER — PREDNISONE 50 MG PO TABS
50.0000 mg | ORAL_TABLET | Freq: Every day | ORAL | Status: DC
  Administered 2021-04-11 – 2021-04-12 (×2): 50 mg via ORAL
  Filled 2021-04-08 (×2): qty 1

## 2021-04-08 MED ORDER — ADULT MULTIVITAMIN W/MINERALS CH
ORAL_TABLET | Freq: Every day | ORAL | Status: DC
  Administered 2021-04-10 – 2021-04-12 (×3): 1 via ORAL
  Filled 2021-04-08 (×5): qty 1

## 2021-04-08 MED ORDER — PANTOPRAZOLE SODIUM 40 MG PO TBEC
40.0000 mg | DELAYED_RELEASE_TABLET | Freq: Every day | ORAL | Status: DC
  Administered 2021-04-10 – 2021-04-12 (×3): 40 mg via ORAL
  Filled 2021-04-08 (×5): qty 1

## 2021-04-08 MED ORDER — PHENOL 1.4 % MT LIQD
1.0000 | OROMUCOSAL | Status: DC | PRN
  Administered 2021-04-08: 13:00:00 1 via OROMUCOSAL
  Filled 2021-04-08 (×2): qty 177

## 2021-04-08 MED ORDER — HYDROCOD POLST-CPM POLST ER 10-8 MG/5ML PO SUER
5.0000 mL | Freq: Once | ORAL | Status: AC
  Administered 2021-04-08: 5 mL via ORAL
  Filled 2021-04-08: qty 5

## 2021-04-08 MED ORDER — SODIUM CHLORIDE 0.9 % IV SOLN: INTRAVENOUS | Status: AC

## 2021-04-08 MED ORDER — ASCORBIC ACID 500 MG PO TABS
500.0000 mg | ORAL_TABLET | Freq: Every day | ORAL | Status: DC
  Administered 2021-04-10 – 2021-04-12 (×3): 500 mg via ORAL
  Filled 2021-04-08 (×5): qty 1

## 2021-04-08 MED ORDER — METHYLPREDNISOLONE SODIUM SUCC 125 MG IJ SOLR
1.0000 mg/kg | Freq: Two times a day (BID) | INTRAMUSCULAR | Status: AC
  Administered 2021-04-08 – 2021-04-10 (×5): 73.75 mg via INTRAVENOUS
  Filled 2021-04-08 (×5): qty 2

## 2021-04-08 MED ORDER — METHOTREXATE 2.5 MG PO TABS: 12.5000 mg | ORAL_TABLET | ORAL | Status: DC

## 2021-04-08 MED ORDER — SODIUM CHLORIDE 0.9 % IV SOLN
100.0000 mg | Freq: Every day | INTRAVENOUS | Status: AC
  Administered 2021-04-09 – 2021-04-12 (×4): 100 mg via INTRAVENOUS
  Filled 2021-04-08 (×4): qty 100

## 2021-04-08 MED ORDER — SODIUM CHLORIDE 0.9 % IV SOLN
200.0000 mg | Freq: Once | INTRAVENOUS | Status: AC
  Administered 2021-04-08: 17:00:00 200 mg via INTRAVENOUS
  Filled 2021-04-08: qty 40

## 2021-04-08 MED ORDER — SODIUM CHLORIDE 0.9 % IV SOLN
200.0000 mg | Freq: Once | INTRAVENOUS | Status: DC
  Filled 2021-04-08: qty 40

## 2021-04-08 MED ORDER — SODIUM CHLORIDE 0.9 % IV SOLN: 250.0000 mL | INTRAVENOUS | Status: DC | PRN

## 2021-04-08 MED ORDER — KETOROLAC TROMETHAMINE 30 MG/ML IJ SOLN
15.0000 mg | Freq: Once | INTRAMUSCULAR | Status: AC
  Administered 2021-04-08: 15 mg via INTRAVENOUS
  Filled 2021-04-08: qty 1

## 2021-04-08 MED ORDER — ZINC SULFATE 220 (50 ZN) MG PO CAPS
220.0000 mg | ORAL_CAPSULE | Freq: Every day | ORAL | Status: DC
  Administered 2021-04-08 – 2021-04-12 (×4): 220 mg via ORAL
  Filled 2021-04-08 (×5): qty 1

## 2021-04-08 NOTE — ED Notes (Signed)
Let the Pt walk around in the room to check his O2 level, the O2 level were ranged around 88 to 91. It stayed near 88 and 89 most of the time. Also the Pt were saying he felt unbounded while walking around the room.

## 2021-04-08 NOTE — ED Triage Notes (Signed)
Pt arrives to ED from home via [facility] with c/c of SoB secondary to covid Dx last Wednesday. Pt is currently taking paxlovid prescribed by his [facility] provider. Tonight he feels like his airway is "full of mucus" that he cant cough up. EMS reports transport vitals of p86, 148/70, O2 sat 95% on room air. Upon arrival, pt A&Ox4, NAD. Dr Beather Arbour at bedside.

## 2021-04-08 NOTE — ED Provider Notes (Signed)
[facility] Emergency Department Provider Note   ____________________________________________   Event Date/Time   First MD Initiated Contact with Patient 04/08/21 [phone]     (approximate)  I have reviewed the triage vital signs and the nursing notes.   HISTORY  Chief Complaint Shortness of breath    HPI [name] is a 87 y.o. male brought to the ED via EMS from home with a chief complaint of cough and shortness of breath.  Patient tested positive for COVID by home antigen test yesterday.  Started on Paxlovid by his PCP.  Complains of generalized malaise, body aches, fever, nonproductive cough, chest pain on coughing and shortness of breath.  Denies abdominal pain, nausea, vomiting or diarrhea.     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, 12/26/10, 06/21/08, 04/20/06, 04/11/03, 01/05/03   Cancer ([place])    melanoma   Cardiac arrhythmia    Cataract    Chicken pox    Colon polyp 12/29/10, 04/18/06, 01/05/03   Diplopia    Dysrhythmia    Essential hypertension    Exotropia, left eye    GERD (gastroesophageal reflux disease)    Gout    H/O: CVA (cerebrovascular accident)    Hemorrhoid    Hepatic cyst    History of kidney stones    Low testosterone    Melanoma in situ ([place])    left ear, right cheek   Migraine    Near syncope    Nephrolithiasis    Nephrolithiasis    Occlusion and stenosis of vertebral artery    Psoriasis    Reflux    Splenomegaly    Strabismus    Subclavian steal syndrome    Syncope and collapse    Thrombocytopenia (HCC)    Ventricular premature depolarization    Vertebral artery stenosis     Patient Active Problem List   Diagnosis Date Noted   Pneumonia due to COVID-19 virus 04/08/2021   Acute respiratory failure due to COVID-19 ([place]) 04/08/2021   GERD (gastroesophageal reflux disease) 04/08/2021   Alternating exotropia with V  pattern 12/03/2017   Diplopia 12/03/2017   History of strabismus surgery 12/03/2017   Hypertropia of right eye 12/03/2017   Monocular esotropia of left eye 12/03/2017   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[place]) 02/11/2017   Benign prostatic hyperplasia with lower urinary tract symptoms 06/26/2016   Thrombocytopenia ([place]) 06/04/2016   Nocturia 03/27/2016   Weak urinary stream 03/27/2016   Allergic rhinitis 01/31/2016   Barrett's esophagus with esophagitis 01/31/2016   Divergent squint 01/31/2016   Hemorrhoid 01/31/2016   Hypotestosteronism 01/31/2016   Melanoma in situ ([place]) 01/31/2016   Headache, migraine 01/31/2016   Calculus of kidney 01/31/2016   Essential (primary) hypertension 10/20/2015   Temporary cerebral vascular dysfunction 10/20/2015   History of surgical procedure 10/20/2015   Status post eye surgery 10/20/2015   Subclavian steal syndrome    Arrhythmia    Syncope and collapse    Near syncope 07/12/2014   Vertebral artery obstruction 07/12/2014   Atrial ectopy 07/12/2014   Ventricular ectopy 07/12/2014   Cardiac arrhythmia 07/12/2014   Premature complex, ventricular 07/12/2014    Past Surgical History:  Procedure Laterality Date   APPENDECTOMY     COLONOSCOPY     COLONOSCOPY WITH PROPOFOL N/A 03/13/2016   Procedure: COLONOSCOPY WITH PROPOFOL;  Surgeon: Lollie Sails, MD;  Location: [facility] ENDOSCOPY;  Service: Endoscopy;  Laterality: N/A;   diplopia     ESOPHAGOGASTRODUODENOSCOPY  08/2014, 06/14/2012   ESOPHAGOGASTRODUODENOSCOPY (EGD) WITH PROPOFOL N/A 03/13/2016   Procedure: ESOPHAGOGASTRODUODENOSCOPY (EGD) WITH PROPOFOL;  Surgeon: Lollie Sails, MD;  Location: [facility] ENDOSCOPY;  Service: Endoscopy;  Laterality: N/A;   ESOPHAGOGASTRODUODENOSCOPY (EGD) WITH PROPOFOL N/A 07/15/2018   Procedure: ESOPHAGOGASTRODUODENOSCOPY (EGD) WITH PROPOFOL;  Surgeon: Lollie Sails, MD;  Location: [facility] ENDOSCOPY;  Service: Endoscopy;  Laterality: N/A;   EYE SURGERY   08/16/2003   eye sug   MOHS SURGERY  2011, 2012   left ear and right cheek   STRABISMUS SURGERY  10/20/2015   2 horizontal muscles-left   TONSILLECTOMY     WISDOM TOOTH EXTRACTION      Prior to Admission medications   Medication Sig Start Date End Date Taking? Authorizing Provider  calcium-vitamin D (OSCAL WITH D) 500-200 MG-UNIT tablet Take 1 tablet by mouth daily with breakfast.    [provider]  clobetasol ointment (TEMOVATE) 0.05 % Apply topically. 03/31/21   [provider]  desonide (DESOWEN) 0.05 % cream Apply 1 application topically daily as needed. 05/23/16   [provider]  ezetimibe (ZETIA) 10 MG tablet Take 1 tablet (10 mg total) by mouth daily. 09/27/20   Minna Merritts, MD  finasteride (PROSCAR) 5 MG tablet Take 1 tablet (5 mg total) by mouth daily. 10/10/20   Stoioff, Ronda Fairly, MD  folic acid (FOLVITE) 1 MG tablet Take 1 tablet by mouth. 6 times a week 11/18/20   [provider]  ketoconazole (NIZORAL) 2 % shampoo Apply topically 2 (two) times a week. 03/01/21   [provider]  methotrexate 2.5 MG tablet Take 5 tablets by mouth once a week. Patient not taking: Reported on 04/08/2021 11/18/20   [provider]  Multiple Vitamin (MULTI-VITAMINS) TABS Take by mouth daily.     [provider]  Nirmatrelvir & Ritonavir 20 x 150 MG & 10 x '100MG'$  TBPK Take 2 (two) pink tablets (300 mg nirmatrelvir) with 1 (one) tablet (100 mg ritonavir) by mouth twice a day for 5 days. All 3 (three) tablets to be taken together every morning and evening. 04/07/21 04/12/21  [provider]  omeprazole (PRILOSEC) 20 MG capsule Take 20 mg by mouth daily. 02/10/16   [provider]  tacrolimus (PROTOPIC) 0.1 % ointment  11/11/17   [provider]  Testosterone 20.25 MG/ACT (1.62%) GEL Apply to shoulders and/or upper arms only 5 grams 03/05/16   [provider]  valACYclovir (VALTREX) 1000 MG tablet Take 500 mg by mouth  daily.  06/05/13   [provider]    Allergies Other  Family History  Problem Relation Age of Onset   Heart failure Mother    Osteoporosis Mother    Stroke Mother    Heart attack Father    Gout Father    Diabetes Mellitus II Father    Renal Disease Father    Alcohol abuse Father    Osteoporosis Maternal Grandfather    Osteoporosis Maternal Grandmother     Social History Social History   Tobacco Use   Smoking status: Former    Packs/day: 0.25    Years: 10.00    Pack years: 2.50    Types: Cigarettes    Quit date: 01/30/1961    Years since quitting: 60.2   Smokeless tobacco: Never  Vaping Use   Vaping Use: Never used  Substance Use Topics   Alcohol use: Yes    Alcohol/week: 0.0 standard  drinks    Comment: social drinker   Drug use: No    Review of Systems  Constitutional: Positive for fever and myalgias Eyes: No visual changes. ENT: No sore throat. Cardiovascular: Positive for chest pain. Respiratory: Positive for cough and shortness of breath. Gastrointestinal: No abdominal pain.  No nausea, no vomiting.  No diarrhea.  No constipation. Genitourinary: Negative for dysuria. Musculoskeletal: Negative for back pain. Skin: Negative for rash. Neurological: Negative for headaches, focal weakness or numbness.   ____________________________________________   PHYSICAL EXAM:  VITAL SIGNS: ED Triage Vitals  Enc Vitals Group     BP      Pulse      Resp      Temp      Temp src      SpO2      Weight      Height      Head Circumference      Peak Flow      Pain Score      Pain Loc      Pain Edu?      Excl. in [place]?     Constitutional: Alert and oriented.  Elderly appearing and in mild acute distress. Eyes: Conjunctivae are normal. PERRL. EOMI. Head: Atraumatic. Nose: No congestion/rhinnorhea. Mouth/Throat: Mucous membranes are mildly dry. Neck: No stridor.  Supple neck without meningismus. Cardiovascular: Normal rate, regular rhythm. Grossly  normal heart sounds.  Good peripheral circulation. Respiratory: Normal respiratory effort.  No retractions. Lungs diminished bibasilar. Gastrointestinal: Soft and nontender to light or deep palpation. No distention. No abdominal bruits. No CVA tenderness. Musculoskeletal: No lower extremity tenderness nor edema.  No joint effusions. Neurologic:  Normal speech and language. No gross focal neurologic deficits are appreciated. No gait instability. Skin:  Skin is warm, dry and intact. No rash noted.  No petechiae. Psychiatric: Mood and affect are normal. Speech and behavior are normal.  ____________________________________________   LABS (all labs ordered are listed, but only abnormal results are displayed)  Labs Reviewed  RESP PANEL BY RT-PCR (FLU A&B, COVID) ARPGX2 - Abnormal; Notable for the following components:      Result Value   SARS Coronavirus 2 by RT PCR POSITIVE (*)    All other components within normal limits  C-REACTIVE PROTEIN - Abnormal; Notable for the following components:   CRP 9.1 (*)    All other components within normal limits  COMPREHENSIVE METABOLIC PANEL - Abnormal; Notable for the following components:   Glucose, Bld 127 (*)    Creatinine, Ser 1.36 (*)    Total Bilirubin 1.5 (*)    GFR, Estimated 51 (*)    Anion gap 4 (*)    All other components within normal limits  CBC WITH DIFFERENTIAL/PLATELET - Abnormal; Notable for the following components:   Hemoglobin 17.5 (*)    Platelets 99 (*)    nRBC 0.3 (*)    All other components within normal limits  CBC WITH DIFFERENTIAL/PLATELET - Abnormal; Notable for the following components:   Platelets 90 (*)    All other components within normal limits  COMPREHENSIVE METABOLIC PANEL - Abnormal; Notable for the following components:   Glucose, Bld 171 (*)    BUN 37 (*)    Calcium 8.6 (*)    Total Bilirubin 1.5 (*)    All other components within normal limits  C-REACTIVE PROTEIN - Abnormal; Notable for the following  components:   CRP 28.0 (*)    All other components within normal limits  D-DIMER, QUANTITATIVE - Abnormal;  Notable for the following components:   D-Dimer, Quant 1.65 (*)    All other components within normal limits  CBC WITH DIFFERENTIAL/PLATELET - Abnormal; Notable for the following components:   Platelets 93 (*)    All other components within normal limits  COMPREHENSIVE METABOLIC PANEL - Abnormal; Notable for the following components:   Glucose, Bld 169 (*)    BUN 47 (*)    Calcium 8.8 (*)    Total Protein 6.2 (*)    Albumin 3.4 (*)    Alkaline Phosphatase 32 (*)    All other components within normal limits  C-REACTIVE PROTEIN - Abnormal; Notable for the following components:   CRP 15.5 (*)    All other components within normal limits  D-DIMER, QUANTITATIVE - Abnormal; Notable for the following components:   D-Dimer, Quant 0.71 (*)    All other components within normal limits  CBC WITH DIFFERENTIAL/PLATELET - Abnormal; Notable for the following components:   Platelets 103 (*)    All other components within normal limits  COMPREHENSIVE METABOLIC PANEL - Abnormal; Notable for the following components:   Glucose, Bld 165 (*)    BUN 44 (*)    Calcium 8.6 (*)    Total Protein 5.9 (*)    Albumin 3.2 (*)    AST 51 (*)    Alkaline Phosphatase 34 (*)    GFR, Estimated 58 (*)    All other components within normal limits  C-REACTIVE PROTEIN - Abnormal; Notable for the following components:   CRP 8.3 (*)    All other components within normal limits  BRAIN NATRIURETIC PEPTIDE  D-DIMER, QUANTITATIVE  FERRITIN  FIBRINOGEN  HEPATITIS B SURFACE ANTIGEN  LACTATE DEHYDROGENASE  PROCALCITONIN  LACTIC ACID, PLASMA  FERRITIN  MAGNESIUM  PHOSPHORUS  D-DIMER, QUANTITATIVE  CBC WITH DIFFERENTIAL/PLATELET  COMPREHENSIVE METABOLIC PANEL  C-REACTIVE PROTEIN  D-DIMER, QUANTITATIVE  TYPE AND SCREEN  TROPONIN I (HIGH SENSITIVITY)  TROPONIN I (HIGH SENSITIVITY)    ____________________________________________  EKG  ED ECG REPORT I, Yasmeen Manka J, the attending physician, personally viewed and interpreted this ECG.   Date: 04/08/2021  EKG Time: 0343  Rate: 79  Rhythm: normal EKG, normal sinus rhythm  Axis: Normal  Intervals:none  ST&T Change: Nonspecific  ____________________________________________  RADIOLOGY I, Erza Mothershead J, personally viewed and evaluated these images (plain radiographs) as part of my medical decision making, as well as reviewing the written report by the radiologist.  ED MD interpretation: COVID-19 pneumonia  Official radiology report(s): No results found. Chest x-ray interpreted per Dr. Pascal Lux: Suspect atypical pneumonia ____________________________________________   PROCEDURES  Procedure(s) performed (including Critical Care):  .1-3 Lead EKG Interpretation  Date/Time: 04/08/2021 3:44 AM Performed by: Paulette Blanch, MD Authorized by: Paulette Blanch, MD     Interpretation: normal     ECG rate:  80   ECG rate assessment: normal     Rhythm: sinus rhythm     Ectopy: none     Conduction: normal   Comments:     Patient placed on cardiac monitor to evaluate for arrhythmias   ____________________________________________   INITIAL IMPRESSION / ASSESSMENT AND PLAN / ED COURSE  As part of my medical decision making, I reviewed the following data within the [facility] notes reviewed and incorporated, Labs reviewed, EKG interpreted, Old chart reviewed, Radiograph reviewed, and Notes from prior ED visits     87 year old male who is COVID-positive presenting with fever, generalized malaise, cough and shortness of breath. Differential includes, but is  not limited to, viral syndrome, bronchitis including COPD exacerbation, pneumonia, reactive airway disease including asthma, CHF including exacerbation with or without pulmonary/interstitial edema, pneumothorax, ACS, thoracic trauma, and pulmonary  embolism.   Will obtain lab work, chest x-ray, PCR COVID test as there is no documented positive test in the system.  Initiate IV fluid hydration, Toradol, Tussionex.  We will perform ambulation trial.  Clinical Course as of 04/12/21 0224  Sat Apr 08, 2021  0553 Room air saturations 88-89% on ambulation. [JS]    Clinical Course User Index [JS] Paulette Blanch, MD     ____________________________________________   FINAL CLINICAL IMPRESSION(S) / ED DIAGNOSES  Final diagnoses:  COVID-19  SOB (shortness of breath)  Pneumonia due to COVID-19 virus  Hypoxia     ED Discharge Orders     None        Note:  This document was prepared using Dragon voice recognition software and may include unintentional dictation errors.    Paulette Blanch, MD 04/12/21 Reece Agar

## 2021-04-08 NOTE — H&P (Addendum)
History and Physical    [name] [id number] DOB: [birth year] DOA: 04/08/2021  PCP: [name], MD   Patient coming from: Home  I have personally briefly reviewed patient's old medical records in [place]  Chief Complaint: Shortness of breath   HPI: [name] is a 87 y.o. male with medical history significant for hypertension, GERD, history of CVA who presents to the emergency room by EMS for evaluation of shortness of breath. Patient states he started having symptoms about 5 days ago which she describes as congestion, fever, cough, sore throat and myalgias. He tested positive for the COVID-19 virus on 04/05/21 and was started on Paxlovid by his primary care provider. Patient called EMS because he states that he feels he is drowning in his own secretions and is unable to cough up any phlegm.  Upon arrival to the ER he was noted to have room air pulse oximetry of 95%. He continues to complain of severe sore throat and is unable to swallow or cough up any phlegm but denies having any more fever or chills.   He has nausea but denies having any abdominal pain, no diarrhea, no urinary symptoms, no dizziness, no lightheadedness, no headache, no palpitations, no lower extremity swelling, no diaphoresis, no blurred vision or any focal deficits. Labs show sodium 139, potassium 4.5, chloride 105, bicarb 30, glucose 127, BUN 19, creatinine 1.36, calcium 9.0, alkaline phosphatase 50, albumin 4.8, AST 26, ALT 20, total protein 7.7, total bilirubin 1.5, BNP 38, LDH 142, ferritin 122, lactic acid 1.2, procalcitonin less than 0.10, white count 6.3, hemoglobin 17.5, hematocrit 50.1, MCV 96, RDW 12.4, platelet count 99, D-dimer 0.45, fibrinogen 464 Patient's SARS coronavirus 2 point-of-care test is positive Chest x-ray reviewed by me shows patchy densities at both lung bases suspicious for atypical pneumonia. Twelve-lead EKG reviewed by me shows sinus rhythm   ED Course: Patient is  an 87 year old male who is positive for the COVID-19 virus and presents to the ER for evaluation of worsening shortness of breath and severe sore throat and inability to clear his secretions. Chest x-ray shows evidence of viral pneumonia.  Patient's pulse oximetry dropped to 88% with ambulation requiring oxygen supplementation at 2 L.  The He is vaccinated against the COVID-19 virus and has received 1 booster dose. He has been started on remdesivir and will be admitted to the hospital for further evaluation.     Review of Systems: As per HPI otherwise all other systems reviewed and negative.    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, 12/26/10, 06/21/08, 04/20/06, 04/11/03, 01/05/03   Cancer ([place])    melanoma   Cardiac arrhythmia    Cataract    Chicken pox    Colon polyp 12/29/10, 04/18/06, 01/05/03   Diplopia    Dysrhythmia    Essential hypertension    Exotropia, left eye    GERD (gastroesophageal reflux disease)    Gout    H/O: CVA (cerebrovascular accident)    Hemorrhoid    Hepatic cyst    History of kidney stones    Low testosterone    Melanoma in situ ([place])    left ear, right cheek   Migraine    Near syncope    Nephrolithiasis    Nephrolithiasis    Occlusion and stenosis of vertebral artery    Psoriasis    Reflux    Splenomegaly  Strabismus    Subclavian steal syndrome    Syncope and collapse    Thrombocytopenia (HCC)    Ventricular premature depolarization    Vertebral artery stenosis     Past Surgical History:  Procedure Laterality Date   APPENDECTOMY     COLONOSCOPY     COLONOSCOPY WITH PROPOFOL N/A 03/13/2016   Procedure: COLONOSCOPY WITH PROPOFOL;  Surgeon: Lollie Sails, MD;  Location: [facility] ENDOSCOPY;  Service: Endoscopy;  Laterality: N/A;   diplopia     ESOPHAGOGASTRODUODENOSCOPY  08/2014, 06/14/2012   ESOPHAGOGASTRODUODENOSCOPY (EGD) WITH PROPOFOL N/A 03/13/2016    Procedure: ESOPHAGOGASTRODUODENOSCOPY (EGD) WITH PROPOFOL;  Surgeon: Lollie Sails, MD;  Location: [facility] ENDOSCOPY;  Service: Endoscopy;  Laterality: N/A;   ESOPHAGOGASTRODUODENOSCOPY (EGD) WITH PROPOFOL N/A 07/15/2018   Procedure: ESOPHAGOGASTRODUODENOSCOPY (EGD) WITH PROPOFOL;  Surgeon: Lollie Sails, MD;  Location: [facility] ENDOSCOPY;  Service: Endoscopy;  Laterality: N/A;   EYE SURGERY  08/16/2003   eye sug   MOHS SURGERY  2011, 2012   left ear and right cheek   STRABISMUS SURGERY  10/20/2015   2 horizontal muscles-left   TONSILLECTOMY     WISDOM TOOTH EXTRACTION       reports that he quit smoking about 60 years ago. His smoking use included cigarettes. He has a 2.50 pack-year smoking history. He has never used smokeless tobacco. He reports current alcohol use. He reports that he does not use drugs.  Allergies  Allergen Reactions   Other Diarrhea    Other reaction(s): Abdominal Pain tarragon Other reaction(s): Abdominal Pain tarragon    Family History  Problem Relation Age of Onset   Heart failure Mother    Osteoporosis Mother    Stroke Mother    Heart attack Father    Gout Father    Diabetes Mellitus II Father    Renal Disease Father    Alcohol abuse Father    Osteoporosis Maternal Grandfather    Osteoporosis Maternal Grandmother       Prior to Admission medications   Medication Sig Start Date End Date Taking? Authorizing Provider  calcium-vitamin D (OSCAL WITH D) 500-200 MG-UNIT tablet Take 1 tablet by mouth daily with breakfast.    [provider]  desonide (DESOWEN) 0.05 % cream Apply 1 application topically daily as needed. 05/23/16   [provider]  ezetimibe (ZETIA) 10 MG tablet Take 1 tablet (10 mg total) by mouth daily. 09/27/20   Minna Merritts, MD  finasteride (PROSCAR) 5 MG tablet Take 1 tablet (5 mg total) by mouth daily. 10/10/20   Stoioff, Ronda Fairly, MD  folic acid (FOLVITE) 1 MG tablet Take 1 tablet by mouth. 6 times a week  11/18/20   [provider]  methotrexate 2.5 MG tablet Take 5 tablets by mouth once a week. 11/18/20   [provider]  Multiple Vitamin (MULTI-VITAMINS) TABS Take by mouth daily.     [provider]  omeprazole (PRILOSEC) 20 MG capsule Take 20 mg by mouth daily. 02/10/16   [provider]  tacrolimus (PROTOPIC) 0.1 % ointment  11/11/17   [provider]  Testosterone 20.25 MG/ACT (1.62%) GEL Apply to shoulders and/or upper arms only 5 grams 03/05/16   [provider]  valACYclovir (VALTREX) 1000 MG tablet Take 500 mg by mouth daily.  06/05/13   [provider]    Physical Exam: Vitals:   04/08/21 0352 04/08/21 0530 04/08/21 0748 04/08/21 0914  BP:  125/69 118/69 (!) 115/58  Pulse:  88 81 78  Resp:  '19 20 18  '$ Temp:   98.1 F (36.7 C) 98.3 F (36.8 C)  TempSrc:   Oral Oral  SpO2:  (!) 89% 94% 96%  Weight: 73.9 kg     Height: '5\' 10"'$  (1.778 m)        Vitals:   04/08/21 0352 04/08/21 0530 04/08/21 0748 04/08/21 0914  BP:  125/69 118/69 (!) 115/58  Pulse:  88 81 78  Resp:  '19 20 18  '$ Temp:   98.1 F (36.7 C) 98.3 F (36.8 C)  TempSrc:   Oral Oral  SpO2:  (!) 89% 94% 96%  Weight: 73.9 kg     Height: '5\' 10"'$  (1.778 m)         Constitutional: Alert and oriented x 3 . Not in any apparent distress HEENT:      Head: Normocephalic and atraumatic.         Eyes: PERLA, EOMI, Conjunctivae are normal. Sclera is non-icteric.       Mouth/Throat: Mucous membranes are moist.       Neck: Supple with no signs of meningismus. Cardiovascular: Regular rate and rhythm. No murmurs, gallops, or rubs. 2+ symmetrical distal pulses are present . No JVD. No LE edema Respiratory: Respiratory effort normal . Bilateral air entry. No wheezes, crackles, or rhonchi.  Gastrointestinal: Soft, non tender, and non distended with positive bowel sounds.  Genitourinary: No CVA tenderness. Musculoskeletal: Nontender with normal range of motion in all  extremities. No cyanosis, or erythema of extremities. Neurologic:  Face is symmetric. Moving all extremities. No gross focal neurologic deficits . Skin: Skin is warm, dry.  No rash or ulcers Psychiatric: Mood and affect are normal    Labs on Admission: I have personally reviewed following labs and imaging studies  CBC: Recent Labs  Lab 04/08/21 0402  WBC 6.3  NEUTROABS 4.8  HGB 17.5*  HCT 50.1  MCV 96.0  PLT 99*   Basic Metabolic Panel: Recent Labs  Lab 04/08/21 0402  NA 139  K 4.5  CL 105  CO2 30  GLUCOSE 127*  BUN 19  CREATININE 1.36*  CALCIUM 9.0   GFR: Estimated Creatinine Clearance: 41 mL/min (A) (by C-G formula based on SCr of 1.36 mg/dL (H)). Liver Function Tests: Recent Labs  Lab 04/08/21 0402  AST 26  ALT 20  ALKPHOS 50  BILITOT 1.5*  PROT 7.7  ALBUMIN 4.8   No results for input(s): LIPASE, AMYLASE in the last 168 hours. No results for input(s): AMMONIA in the last 168 hours. Coagulation Profile: No results for input(s): INR, PROTIME in the last 168 hours. Cardiac Enzymes: No results for input(s): CKTOTAL, CKMB, CKMBINDEX, TROPONINI in the last 168 hours. BNP (last 3 results) No results for input(s): PROBNP in the last 8760 hours. HbA1C: No results for input(s): HGBA1C in the last 72 hours. CBG: No results for input(s): GLUCAP in the last 168 hours. Lipid Profile: No results for input(s): CHOL, HDL, LDLCALC, TRIG, CHOLHDL, LDLDIRECT in the last 72 hours. Thyroid Function Tests: No results for input(s): TSH, T4TOTAL, FREET4, T3FREE, THYROIDAB in the last 72 hours. Anemia Panel: Recent Labs    04/08/21 0402  FERRITIN 122   Urine analysis:    Component Value Date/Time   COLORURINE Yellow 06/03/2014 1536   APPEARANCEUR Clear 10/10/2020 0937   LABSPEC 1.013 06/03/2014 1536   PHURINE 5.0 06/03/2014 1536   GLUCOSEU Negative 10/10/2020 0937   GLUCOSEU Negative 06/03/2014 1536   HGBUR Negative 06/03/2014 1536   BILIRUBINUR Negative  10/10/2020 0937   BILIRUBINUR Negative 06/03/2014 1536   KETONESUR Negative 06/03/2014 1536   PROTEINUR Negative 10/10/2020 0937   PROTEINUR Negative 06/03/2014 1536   NITRITE Negative 10/10/2020 0937   NITRITE Negative 06/03/2014 1536   LEUKOCYTESUR Negative 10/10/2020 0937   LEUKOCYTESUR Negative 06/03/2014 1536    Radiological Exams on Admission: DG Chest Port 1 View  Result Date: 04/08/2021 CLINICAL DATA:  Shortness of breath EXAM: PORTABLE CHEST 1 VIEW COMPARISON:  05/19/2005 FINDINGS: Low volume chest. Normal heart size and mediastinal contours. Indistinct patchy density suspected at the bilateral chest. No visible effusion or pneumothorax. IMPRESSION: Suspect atypical pneumonia. Electronically Signed   By: Monte Fantasia M.D.   On: 04/08/2021 04:21     Assessment/Plan Principal Problem:   Pneumonia due to COVID-19 virus Active Problems:   Essential (primary) hypertension   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HCC)   Acute respiratory failure due to COVID-19 (HCC)   GERD (gastroesophageal reflux disease)     Pneumonia due to COVID-19 virus with acute respiratory failure Patient presents to the ER for evaluation of shortness of breath and severe sore throat and inability to clear his secretions. His initial positive COVID-19 test was on 04/05/21 He is vaccinated and received 1 booster dose of the COVID-19 vaccine Chest x-ray shows findings suggestive of atypical pneumonia and patient is hypoxic with room air pulse oximetry of 89% and is currently on 2 L of oxygen He was treated with Paxlovid as an outpatient but will be started on remdesivir per protocol Place patient on systemic steroids Supportive care with bronchodilator therapy, antitussives and vitamins     History of chronic idiopathic thrombocytopenia No evidence of bleeding at this time SCDs for DVT prophylaxis    GERD Continue Protonix  DVT prophylaxis: SCD  Code Status: full code  Family  Communication: Greater than 50% of time was spent discussing patient's condition and plan of care with him at the bedside.  All questions and concerns have been addressed.  He verbalizes understanding and agrees with the plan. Disposition Plan: Back to previous home environment Consults called: none  Status: At the time of admission, it appears that the appropriate admission status for this patient is inpatient. This is judged to be reasonable and necessary in order to provide the required intensity of service to ensure the patient's safety given the presenting symptoms, physical exam findings, and initial radiographic and laboratory data in the context of their comorbid conditions. Patient requires inpatient status due to high intensity of service, high risk of further deterioration and high frequency of surveillance required.    Collier Bullock MD [facility]     04/08/2021, 9:26 AM

## 2021-04-08 NOTE — ED Notes (Signed)
Transport requested at this time.

## 2021-04-08 NOTE — Progress Notes (Signed)
Remdesivir - Pharmacy Brief Note   O:  ALT: 20 CXR:  SpO2: 91 % on RA   A/P:  Remdesivir 200 mg IVPB once followed by 100 mg IVPB daily x 4 days.   Stellar Gensel D 04/08/2021 6:29 AM

## 2021-04-08 NOTE — ED Notes (Signed)
Dr. Francine Graven, MD at bedside at this time.

## 2021-04-09 DIAGNOSIS — J9600 Acute respiratory failure, unspecified whether with hypoxia or hypercapnia: Secondary | ICD-10-CM

## 2021-04-09 DIAGNOSIS — I10 Essential (primary) hypertension: Secondary | ICD-10-CM

## 2021-04-09 DIAGNOSIS — D693 Immune thrombocytopenic purpura: Secondary | ICD-10-CM

## 2021-04-09 LAB — PHOSPHORUS: Phosphorus: 2.8 mg/dL (ref 2.5–4.6)

## 2021-04-09 LAB — CBC WITH DIFFERENTIAL/PLATELET
Abs Immature Granulocytes: 0.01 10*3/uL (ref 0.00–0.07)
Basophils Absolute: 0 10*3/uL (ref 0.0–0.1)
Basophils Relative: 0 %
Eosinophils Absolute: 0 10*3/uL (ref 0.0–0.5)
Eosinophils Relative: 0 %
HCT: 47.7 % (ref 39.0–52.0)
Hemoglobin: 16.3 g/dL (ref 13.0–17.0)
Immature Granulocytes: 0 %
Lymphocytes Relative: 19 %
Lymphs Abs: 1.3 10*3/uL (ref 0.7–4.0)
MCH: 32.6 pg (ref 26.0–34.0)
MCHC: 34.2 g/dL (ref 30.0–36.0)
MCV: 95.4 fL (ref 80.0–100.0)
Monocytes Absolute: 0.2 10*3/uL (ref 0.1–1.0)
Monocytes Relative: 2 %
Neutro Abs: 5.3 10*3/uL (ref 1.7–7.7)
Neutrophils Relative %: 79 %
Platelets: 90 10*3/uL — ABNORMAL LOW (ref 150–400)
RBC: 5 MIL/uL (ref 4.22–5.81)
RDW: 12.6 % (ref 11.5–15.5)
WBC: 6.7 10*3/uL (ref 4.0–10.5)
nRBC: 0 % (ref 0.0–0.2)

## 2021-04-09 LAB — COMPREHENSIVE METABOLIC PANEL
ALT: 15 U/L (ref 0–44)
AST: 36 U/L (ref 15–41)
Albumin: 3.8 g/dL (ref 3.5–5.0)
Alkaline Phosphatase: 38 U/L (ref 38–126)
Anion gap: 9 (ref 5–15)
BUN: 37 mg/dL — ABNORMAL HIGH (ref 8–23)
CO2: 25 mmol/L (ref 22–32)
Calcium: 8.6 mg/dL — ABNORMAL LOW (ref 8.9–10.3)
Chloride: 106 mmol/L (ref 98–111)
Creatinine, Ser: 1.16 mg/dL (ref 0.61–1.24)
GFR, Estimated: >60 mL/min (ref >60)
Glucose, Bld: 171 mg/dL — ABNORMAL HIGH (ref 70–99)
Potassium: 3.8 mmol/L (ref 3.5–5.1)
Sodium: 140 mmol/L (ref 135–145)
Total Bilirubin: 1.5 mg/dL — ABNORMAL HIGH (ref 0.3–1.2)
Total Protein: 6.8 g/dL (ref 6.5–8.1)

## 2021-04-09 LAB — MAGNESIUM: Magnesium: 2.1 mg/dL (ref 1.7–2.4)

## 2021-04-09 LAB — C-REACTIVE PROTEIN: CRP: 28 mg/dL — ABNORMAL HIGH (ref <1.0)

## 2021-04-09 LAB — FERRITIN: Ferritin: 262 ng/mL (ref 24–336)

## 2021-04-09 LAB — D-DIMER, QUANTITATIVE: D-Dimer, Quant: 1.65 ug/mL-FEU — ABNORMAL HIGH (ref 0.00–0.50)

## 2021-04-09 MED ORDER — GUAIFENESIN ER 600 MG PO TB12
600.0000 mg | ORAL_TABLET | Freq: Two times a day (BID) | ORAL | Status: DC
  Administered 2021-04-09 – 2021-04-12 (×6): 600 mg via ORAL
  Filled 2021-04-09 (×6): qty 1

## 2021-04-09 MED ORDER — BENZONATATE 100 MG PO CAPS
200.0000 mg | ORAL_CAPSULE | Freq: Three times a day (TID) | ORAL | Status: DC
  Administered 2021-04-09 – 2021-04-12 (×8): 200 mg via ORAL
  Filled 2021-04-09 (×8): qty 2

## 2021-04-09 MED ORDER — MENTHOL 3 MG MT LOZG
1.0000 | LOZENGE | OROMUCOSAL | Status: DC | PRN
  Administered 2021-04-09 (×2): 3 mg via ORAL
  Filled 2021-04-09: qty 9

## 2021-04-09 MED ORDER — HYDROCOD POLST-CPM POLST ER 10-8 MG/5ML PO SUER
5.0000 mL | Freq: Two times a day (BID) | ORAL | Status: DC | PRN
Start: 2021-04-09 — End: 2021-04-12

## 2021-04-09 MED ORDER — ENOXAPARIN SODIUM 40 MG/0.4ML IJ SOSY
40.0000 mg | PREFILLED_SYRINGE | INTRAMUSCULAR | Status: DC
  Administered 2021-04-09 – 2021-04-11 (×3): 40 mg via SUBCUTANEOUS
  Filled 2021-04-09 (×3): qty 0.4

## 2021-04-09 MED ORDER — LIDOCAINE VISCOUS HCL 2 % MT SOLN
15.0000 mL | Freq: Four times a day (QID) | OROMUCOSAL | Status: DC | PRN
  Administered 2021-04-09: 15 mL via OROMUCOSAL
  Filled 2021-04-09 (×2): qty 15

## 2021-04-09 NOTE — Plan of Care (Signed)

## 2021-04-09 NOTE — Progress Notes (Signed)
Daughter updated on patient status and plan of care after permission given from patient to speak with her.

## 2021-04-09 NOTE — Progress Notes (Signed)
PROGRESS NOTE        PATIENT DETAILS Name: [name] Age: 87 y.o. Sex: male Date of Birth: October 26, 1933 Admit Date: 04/08/2021 Admitting Physician Athena Masse, MD [id number], [name], MD  Brief Narrative: Patient is a 87 y.o. male with history of HTN, GERD, prior CVA-who was diagnosed with COVID infection on 8/3-presented with shortness of breath-he was found to have acute hypoxic respiratory failure due to COVID-19 pneumonia.  Significant events: 8/3>> COVID-19 positive 8/6>> admit for acute hypoxic respiratory failure due to COVID-19 pneumonia  Significant studies: 8/6>> CXR: Atypical pneumonia.  COVID-19 medications: Steroids: 8/6>> Remdesivir: 8/6>>  Microbiology data: None  Procedures : None  Consults: None  DVT Prophylaxis : SCDs Start: 04/08/21 0855 Start prophylactic Lovenox-watch platelet count closely.   Subjective: Continues to cough-not any better than yesterday.   Assessment/Plan: Acute hypoxic respiratory failure due to COVID-19 pneumonia: Appears to have mild hypoxia requiring 2-3 L of oxygen.  Continues to cough.  Plans are to continue steroids/Remdesivir-encourage incentive spirometry and mobilization.  Slowly attempt to titrate off oxygen-reassess on 8/8.  Recent Labs    04/08/21 0402 04/09/21 0635  DDIMER 0.45 1.65*  FERRITIN 122 262  LDH 142  --   CRP 9.1*  --     Lab Results  Component Value Date   SARSCOV2NAA POSITIVE (A) 04/08/2021     Chronic thrombocytopenia: Worsened due to acute infection with COVID.  Watch closely.  GERD: Continue PPI  BPH: Continue finasteride  HLD: Continue Zetia  Diet: Diet Order             DIET SOFT Room service appropriate? Yes; Fluid consistency: Thin  Diet effective now                    Code Status: Full code   Family Communication: None at bedside  Disposition Plan: Status is: Inpatient  Remains inpatient appropriate because:Inpatient level  of care appropriate due to severity of illness  Dispo: The patient is from: Home              Anticipated d/c is to: Home              Patient currently is not medically stable to d/c.   Difficult to place patient No   Barriers to Discharge: COVID-19 pneumonia with hypoxemia-on O2 supplementation-on Remdesivir x5 days.  Antimicrobial agents: Anti-infectives (From admission, onward)    Start     Dose/Rate Route Frequency Ordered Stop   04/09/21 1000  remdesivir 100 mg in sodium chloride 0.9 % 100 mL IVPB  Status:  Discontinued       See Hyperspace for full Linked Orders Report.   100 mg 200 mL/hr over 30 Minutes Intravenous Daily 04/08/21 0615 04/08/21 0633   04/09/21 1000  remdesivir 100 mg in sodium chloride 0.9 % 100 mL IVPB  Status:  Discontinued        100 mg 200 mL/hr over 30 Minutes Intravenous Daily 04/08/21 0634 04/08/21 1106   04/09/21 1000  remdesivir 100 mg in sodium chloride 0.9 % 100 mL IVPB       See Hyperspace for full Linked Orders Report.   100 mg 200 mL/hr over 30 Minutes Intravenous Daily 04/08/21 1106 04/13/21 0959   04/08/21 1300  remdesivir 200 mg in sodium chloride 0.9% 250 mL IVPB  See Hyperspace for full Linked Orders Report.   200 mg 580 mL/hr over 30 Minutes Intravenous Once 04/08/21 1106 04/08/21 1750   04/08/21 0645  remdesivir 100 mg in sodium chloride 0.9 % 100 mL IVPB  Status:  Discontinued       See Hyperspace for full Linked Orders Report.   100 mg 200 mL/hr over 30 Minutes Intravenous  Once 04/08/21 [id number] 04/08/21 1106   04/08/21 0645  remdesivir 100 mg in sodium chloride 0.9 % 100 mL IVPB  Status:  Discontinued       See Hyperspace for full Linked Orders Report.   100 mg 200 mL/hr over 30 Minutes Intravenous  Once 04/08/21 [id number] 04/08/21 1106   04/08/21 0615  remdesivir 200 mg in sodium chloride 0.9% 250 mL IVPB  Status:  Discontinued       See Hyperspace for full Linked Orders Report.   200 mg 580 mL/hr over 30 Minutes Intravenous Once  04/08/21 0615 04/08/21 0634        Time spent: 35 minutes-Greater than 50% of this time was spent in counseling, explanation of diagnosis, planning of further management, and coordination of care.  MEDICATIONS: Scheduled Meds:  albuterol  2 puff Inhalation Q6H   vitamin C  500 mg Oral Daily   benzonatate  200 mg Oral TID   calcium-vitamin D  1 tablet Oral Q breakfast   ezetimibe  10 mg Oral Daily   finasteride  5 mg Oral Daily   folic acid  1 mg Oral Daily   guaiFENesin  600 mg Oral BID   methylPREDNISolone (SOLU-MEDROL) injection  1 mg/kg Intravenous Q12H   Followed by   Derrill Memo ON 04/11/2021] predniSONE  50 mg Oral Daily   multivitamin with minerals   Oral Daily   pantoprazole  40 mg Oral Daily   sodium chloride flush  3 mL Intravenous Q12H   zinc sulfate  220 mg Oral Daily   Continuous Infusions:  sodium chloride     remdesivir 100 mg in NS 100 mL 100 mg (04/09/21 0858)   PRN Meds:.sodium chloride, acetaminophen, chlorpheniramine-HYDROcodone, lidocaine, menthol-cetylpyridinium, ondansetron **OR** ondansetron (ZOFRAN) IV, phenol, sodium chloride flush   PHYSICAL EXAM: Vital signs: Vitals:   04/08/21 2309 04/09/21 0408 04/09/21 0758 04/09/21 1124  BP: 112/65 (!) 122/52 119/64 119/67  Pulse: 63 (!) 56 (!) 56 60  Resp: '18 15 16 18  '$ Temp: 97.6 F (36.4 C) 98.2 F (36.8 C) (!) 97.5 F (36.4 C) (!) 97.5 F (36.4 C)  TempSrc:   Oral Oral  SpO2: 97% 99% 96% 96%  Weight:      Height:       Filed Weights   04/08/21 0352 04/08/21 0914  Weight: 73.9 kg 74.3 kg   Body mass index is 23.5 kg/m.   Gen Exam:Alert awake-not in any distress HEENT:atraumatic, normocephalic Chest: B/L clear to auscultation anteriorly CVS:S1S2 regular Abdomen:soft non tender, non distended Extremities:no edema Neurology: Non focal Skin: no rash  I have personally reviewed following labs and imaging studies  LABORATORY DATA: CBC: Recent Labs  Lab 04/08/21 0402 04/09/21 0635  WBC 6.3  6.7  NEUTROABS 4.8 5.3  HGB 17.5* 16.3  HCT 50.1 47.7  MCV 96.0 95.4  PLT 99* 90*    Basic Metabolic Panel: Recent Labs  Lab 04/08/21 0402 04/09/21 0635  NA 139 140  K 4.5 3.8  CL 105 106  CO2 30 25  GLUCOSE 127* 171*  BUN 19 37*  CREATININE 1.36* 1.16  CALCIUM 9.0  8.6*  MG  --  2.1  PHOS  --  2.8    GFR: Estimated Creatinine Clearance: 48.1 mL/min (by C-G formula based on SCr of 1.16 mg/dL).  Liver Function Tests: Recent Labs  Lab 04/08/21 0402 04/09/21 0635  AST 26 36  ALT 20 15  ALKPHOS 50 38  BILITOT 1.5* 1.5*  PROT 7.7 6.8  ALBUMIN 4.8 3.8   No results for input(s): LIPASE, AMYLASE in the last 168 hours. No results for input(s): AMMONIA in the last 168 hours.  Coagulation Profile: No results for input(s): INR, PROTIME in the last 168 hours.  Cardiac Enzymes: No results for input(s): CKTOTAL, CKMB, CKMBINDEX, TROPONINI in the last 168 hours.  BNP (last 3 results) No results for input(s): PROBNP in the last 8760 hours.  Lipid Profile: No results for input(s): CHOL, HDL, LDLCALC, TRIG, CHOLHDL, LDLDIRECT in the last 72 hours.  Thyroid Function Tests: No results for input(s): TSH, T4TOTAL, FREET4, T3FREE, THYROIDAB in the last 72 hours.  Anemia Panel: Recent Labs    04/08/21 0402 04/09/21 0635  FERRITIN 122 262    Urine analysis:    Component Value Date/Time   COLORURINE Yellow 06/03/2014 1536   APPEARANCEUR Clear 10/10/2020 0937   LABSPEC 1.013 06/03/2014 1536   PHURINE 5.0 06/03/2014 1536   GLUCOSEU Negative 10/10/2020 0937   GLUCOSEU Negative 06/03/2014 1536   HGBUR Negative 06/03/2014 1536   BILIRUBINUR Negative 10/10/2020 0937   BILIRUBINUR Negative 06/03/2014 1536   KETONESUR Negative 06/03/2014 1536   PROTEINUR Negative 10/10/2020 0937   PROTEINUR Negative 06/03/2014 1536   NITRITE Negative 10/10/2020 0937   NITRITE Negative 06/03/2014 1536   LEUKOCYTESUR Negative 10/10/2020 0937   LEUKOCYTESUR Negative 06/03/2014 1536     Sepsis Labs: Lactic Acid, Venous    Component Value Date/Time   LATICACIDVEN 1.2 04/08/2021 0402    MICROBIOLOGY: Recent Results (from the past 240 hour(s))  Resp Panel by RT-PCR (Flu A&B, Covid) Nasopharyngeal Swab     Status: Abnormal   Collection Time: 04/08/21  4:02 AM   Specimen: Nasopharyngeal Swab; Nasopharyngeal(NP) swabs in vial transport medium  Result Value Ref Range Status   SARS Coronavirus 2 by RT PCR POSITIVE (A) NEGATIVE Final    Comment: RESULT CALLED TO, READ BACK BY AND VERIFIED WITH: TOM NAGY AT [id number] 04/08/21.PMF (NOTE) SARS-CoV-2 target nucleic acids are DETECTED.  The SARS-CoV-2 RNA is generally detectable in upper respiratory specimens during the acute phase of infection. Positive results are indicative of the presence of the identified virus, but do not rule out bacterial infection or co-infection with other pathogens not detected by the test. Clinical correlation with patient history and other diagnostic information is necessary to determine patient infection status. The expected result is Negative.  Fact Sheet for Patients: EntrepreneurPulse.com.au  Fact Sheet for Healthcare Providers: IncredibleEmployment.be  This test is not yet approved or cleared by the [place] FDA and  has been authorized for detection and/or diagnosis of SARS-CoV-2 by FDA under an Emergency Use Authorization (EUA).  This EUA will remain in effect (meaning this test can be used)  for the duration of  the COVID-19 declaration under Section 564(b)(1) of the Act, 21 U.S.C. section 360bbb-3(b)(1), unless the authorization is terminated or revoked sooner.     Influenza A by PCR NEGATIVE NEGATIVE Final   Influenza B by PCR NEGATIVE NEGATIVE Final    Comment: (NOTE) The Xpert Xpress SARS-CoV-2/FLU/RSV plus assay is intended as an aid in the diagnosis of influenza from Nasopharyngeal swab  specimens and should not be used as a sole basis  for treatment. Nasal washings and aspirates are unacceptable for Xpert Xpress SARS-CoV-2/FLU/RSV testing.  Fact Sheet for Patients: EntrepreneurPulse.com.au  Fact Sheet for Healthcare Providers: IncredibleEmployment.be  This test is not yet approved or cleared by the [place] FDA and has been authorized for detection and/or diagnosis of SARS-CoV-2 by FDA under an Emergency Use Authorization (EUA). This EUA will remain in effect (meaning this test can be used) for the duration of the COVID-19 declaration under Section 564(b)(1) of the Act, 21 U.S.C. section 360bbb-3(b)(1), unless the authorization is terminated or revoked.  Performed at [facility], [address] [place], [place] [address]     RADIOLOGY STUDIES/RESULTS: DG Chest Port 1 View  Result Date: 04/08/2021 CLINICAL DATA:  Shortness of breath EXAM: PORTABLE CHEST 1 VIEW COMPARISON:  05/19/2005 FINDINGS: Low volume chest. Normal heart size and mediastinal contours. Indistinct patchy density suspected at the bilateral chest. No visible effusion or pneumothorax. IMPRESSION: Suspect atypical pneumonia. Electronically Signed   By: Monte Fantasia M.D.   On: 04/08/2021 04:21     LOS: 1 day   Oren Binet, MD  [facility]    To contact the attending provider between 7A-7P or the covering provider during after hours 7P-7A, please log into the web site [email or web address] and access using universal [place] password for that web site. If you do not have the password, please call the hospital operator.  04/09/2021, 12:54 PM

## 2021-04-10 LAB — CBC WITH DIFFERENTIAL/PLATELET
Abs Immature Granulocytes: 0.04 10*3/uL (ref 0.00–0.07)
Basophils Absolute: 0 10*3/uL (ref 0.0–0.1)
Basophils Relative: 0 %
Eosinophils Absolute: 0 10*3/uL (ref 0.0–0.5)
Eosinophils Relative: 0 %
HCT: 44 % (ref 39.0–52.0)
Hemoglobin: 15.2 g/dL (ref 13.0–17.0)
Immature Granulocytes: 1 %
Lymphocytes Relative: 15 %
Lymphs Abs: 1.1 10*3/uL (ref 0.7–4.0)
MCH: 32.8 pg (ref 26.0–34.0)
MCHC: 34.5 g/dL (ref 30.0–36.0)
MCV: 94.8 fL (ref 80.0–100.0)
Monocytes Absolute: 0.4 10*3/uL (ref 0.1–1.0)
Monocytes Relative: 5 %
Neutro Abs: 6 10*3/uL (ref 1.7–7.7)
Neutrophils Relative %: 79 %
Platelets: 93 10*3/uL — ABNORMAL LOW (ref 150–400)
RBC: 4.64 MIL/uL (ref 4.22–5.81)
RDW: 12.6 % (ref 11.5–15.5)
WBC: 7.6 10*3/uL (ref 4.0–10.5)
nRBC: 0 % (ref 0.0–0.2)

## 2021-04-10 LAB — COMPREHENSIVE METABOLIC PANEL
ALT: 17 U/L (ref 0–44)
AST: 37 U/L (ref 15–41)
Albumin: 3.4 g/dL — ABNORMAL LOW (ref 3.5–5.0)
Alkaline Phosphatase: 32 U/L — ABNORMAL LOW (ref 38–126)
Anion gap: 7 (ref 5–15)
BUN: 47 mg/dL — ABNORMAL HIGH (ref 8–23)
CO2: 29 mmol/L (ref 22–32)
Calcium: 8.8 mg/dL — ABNORMAL LOW (ref 8.9–10.3)
Chloride: 106 mmol/L (ref 98–111)
Creatinine, Ser: 1.14 mg/dL (ref 0.61–1.24)
GFR, Estimated: >60 mL/min (ref >60)
Glucose, Bld: 169 mg/dL — ABNORMAL HIGH (ref 70–99)
Potassium: 4.4 mmol/L (ref 3.5–5.1)
Sodium: 142 mmol/L (ref 135–145)
Total Bilirubin: 1.2 mg/dL (ref 0.3–1.2)
Total Protein: 6.2 g/dL — ABNORMAL LOW (ref 6.5–8.1)

## 2021-04-10 LAB — D-DIMER, QUANTITATIVE: D-Dimer, Quant: 0.71 ug/mL-FEU — ABNORMAL HIGH (ref 0.00–0.50)

## 2021-04-10 LAB — C-REACTIVE PROTEIN: CRP: 15.5 mg/dL — ABNORMAL HIGH (ref <1.0)

## 2021-04-10 NOTE — Progress Notes (Signed)
PROGRESS NOTE        PATIENT DETAILS Name: [name] Age: 87 y.o. Sex: male Date of Birth: Sep 12, 1933 Admit Date: 04/08/2021 Admitting Physician Athena Masse, MD [id number], [name], MD  Brief Narrative: Patient is a 87 y.o. male with history of HTN, GERD, prior CVA-who was diagnosed with COVID infection on 8/3-presented with shortness of breath-he was found to have acute hypoxic respiratory failure due to COVID-19 pneumonia.  Significant events: 8/3>> COVID-19 positive 8/6>> admit for acute hypoxic respiratory failure due to COVID-19 pneumonia  Significant studies: 8/6>> CXR: Atypical pneumonia.  COVID-19 medications: Steroids: 8/6>> Remdesivir: 8/6>>  Microbiology data: None  Procedures : None  Consults: None  DVT Prophylaxis : enoxaparin (LOVENOX) injection 40 mg Start: 04/09/21 2200 SCDs Start: 04/08/21 0855 Start prophylactic Lovenox-watch platelet count closely.   Subjective: Feels better-continues to cough-stable on just 2 L of oxygen.  Claims he is using his incentive spirometry as instructed.  His sore throat has improved-and he is now able to tolerate some oral intake that he was not able to do till yesterday.   Assessment/Plan: Acute hypoxic respiratory failure due to COVID-19 pneumonia: Continues to have mild hypoxemia-CRP significantly elevated but patient otherwise appears stable.  Continue steroid/Remdesivir-encourage incentive spirometry.  Have discussed with RN to see if he can titrate this gentleman off oxygen slowly.  Reassess on 8/19  Recent Labs    04/08/21 0402 04/09/21 0635 04/10/21 0532  DDIMER 0.45 1.65* 0.71*  FERRITIN 122 262  --   LDH 142  --   --   CRP 9.1* 28.0*  --      Lab Results  Component Value Date   SARSCOV2NAA POSITIVE (A) 04/08/2021      Chronic thrombocytopenia-history of ITP: Worsened due to acute infection with COVID.  Discussed with patient-given ongoing COVID-19 infection-and  mild thrombocytopenia-recommend that he continue to stay on prophylactic dosing of Lovenox.  We will plan to monitor CBC very closely.    GERD: Continue PPI  BPH: Continue finasteride  HLD: Continue Zetia  Diet: Diet Order             DIET SOFT Room service appropriate? Yes; Fluid consistency: Thin  Diet effective now                    Code Status: Full code   Family Communication: Spoke with spouse-Barbara Meras-[phone]   Disposition Plan: Status is: Inpatient  Remains inpatient appropriate because:Inpatient level of care appropriate due to severity of illness  Dispo: The patient is from: Home              Anticipated d/c is to: Home              Patient currently is not medically stable to d/c.   Difficult to place patient No   Barriers to Discharge: COVID-19 pneumonia with hypoxemia-on O2 supplementation-on Remdesivir x5 days.  Antimicrobial agents: Anti-infectives (From admission, onward)    Start     Dose/Rate Route Frequency Ordered Stop   04/09/21 1000  remdesivir 100 mg in sodium chloride 0.9 % 100 mL IVPB  Status:  Discontinued       See Hyperspace for full Linked Orders Report.   100 mg 200 mL/hr over 30 Minutes Intravenous Daily 04/08/21 0615 04/08/21 0633   04/09/21 1000  remdesivir 100 mg in sodium  chloride 0.9 % 100 mL IVPB  Status:  Discontinued        100 mg 200 mL/hr over 30 Minutes Intravenous Daily 04/08/21 0634 04/08/21 1106   04/09/21 1000  remdesivir 100 mg in sodium chloride 0.9 % 100 mL IVPB       See Hyperspace for full Linked Orders Report.   100 mg 200 mL/hr over 30 Minutes Intravenous Daily 04/08/21 1106 04/13/21 0959   04/08/21 1300  remdesivir 200 mg in sodium chloride 0.9% 250 mL IVPB       See Hyperspace for full Linked Orders Report.   200 mg 580 mL/hr over 30 Minutes Intravenous Once 04/08/21 1106 04/08/21 1750   04/08/21 0645  remdesivir 100 mg in sodium chloride 0.9 % 100 mL IVPB  Status:  Discontinued       See  Hyperspace for full Linked Orders Report.   100 mg 200 mL/hr over 30 Minutes Intravenous  Once 04/08/21 [id number] 04/08/21 1106   04/08/21 0645  remdesivir 100 mg in sodium chloride 0.9 % 100 mL IVPB  Status:  Discontinued       See Hyperspace for full Linked Orders Report.   100 mg 200 mL/hr over 30 Minutes Intravenous  Once 04/08/21 [id number] 04/08/21 1106   04/08/21 0615  remdesivir 200 mg in sodium chloride 0.9% 250 mL IVPB  Status:  Discontinued       See Hyperspace for full Linked Orders Report.   200 mg 580 mL/hr over 30 Minutes Intravenous Once 04/08/21 0615 04/08/21 0634        Time spent: 25 minutes-Greater than 50% of this time was spent in counseling, explanation of diagnosis, planning of further management, and coordination of care.  MEDICATIONS: Scheduled Meds:  albuterol  2 puff Inhalation Q6H   vitamin C  500 mg Oral Daily   benzonatate  200 mg Oral TID   calcium-vitamin D  1 tablet Oral Q breakfast   enoxaparin (LOVENOX) injection  40 mg Subcutaneous Q24H   ezetimibe  10 mg Oral Daily   finasteride  5 mg Oral Daily   folic acid  1 mg Oral Daily   guaiFENesin  600 mg Oral BID   methylPREDNISolone (SOLU-MEDROL) injection  1 mg/kg Intravenous Q12H   Followed by   Derrill Memo ON 04/11/2021] predniSONE  50 mg Oral Daily   multivitamin with minerals   Oral Daily   pantoprazole  40 mg Oral Daily   sodium chloride flush  3 mL Intravenous Q12H   zinc sulfate  220 mg Oral Daily   Continuous Infusions:  sodium chloride     remdesivir 100 mg in NS 100 mL 100 mg (04/10/21 0958)   PRN Meds:.sodium chloride, acetaminophen, chlorpheniramine-HYDROcodone, lidocaine, menthol-cetylpyridinium, ondansetron **OR** ondansetron (ZOFRAN) IV, phenol, sodium chloride flush   PHYSICAL EXAM: Vital signs: Vitals:   04/09/21 2156 04/10/21 0018 04/10/21 0548 04/10/21 0839  BP: 110/63 115/63 110/60 (!) 117/56  Pulse: (!) 57 (!) 56 (!) 53 (!) 53  Resp: '16 16 16 16  '$ Temp: 97.6 F (36.4 C) (!) 97.4  F (36.3 C) 97.6 F (36.4 C) 97.8 F (36.6 C)  TempSrc: Oral Oral    SpO2: 96% 97% 95% 96%  Weight:      Height:       Filed Weights   04/08/21 0352 04/08/21 0914  Weight: 73.9 kg 74.3 kg   Body mass index is 23.5 kg/m.  Gen Exam:Alert awake-not in any distress HEENT:atraumatic, normocephalic Chest: B/L clear to auscultation anteriorly CVS:S1S2  regular Abdomen:soft non tender, non distended Extremities:no edema Neurology: Non focal Skin: no rash   I have personally reviewed following labs and imaging studies  LABORATORY DATA: CBC: Recent Labs  Lab 04/08/21 0402 04/09/21 0635 04/10/21 0532  WBC 6.3 6.7 7.6  NEUTROABS 4.8 5.3 6.0  HGB 17.5* 16.3 15.2  HCT 50.1 47.7 44.0  MCV 96.0 95.4 94.8  PLT 99* 90* 93*     Basic Metabolic Panel: Recent Labs  Lab 04/08/21 0402 04/09/21 0635 04/10/21 0532  NA 139 140 142  K 4.5 3.8 4.4  CL 105 106 106  CO2 '30 25 29  '$ GLUCOSE 127* 171* 169*  BUN 19 37* 47*  CREATININE 1.36* 1.16 1.14  CALCIUM 9.0 8.6* 8.8*  MG  --  2.1  --   PHOS  --  2.8  --      GFR: Estimated Creatinine Clearance: 48.9 mL/min (by C-G formula based on SCr of 1.14 mg/dL).  Liver Function Tests: Recent Labs  Lab 04/08/21 0402 04/09/21 0635 04/10/21 0532  AST 26 36 37  ALT '20 15 17  '$ ALKPHOS 50 38 32*  BILITOT 1.5* 1.5* 1.2  PROT 7.7 6.8 6.2*  ALBUMIN 4.8 3.8 3.4*    No results for input(s): LIPASE, AMYLASE in the last 168 hours. No results for input(s): AMMONIA in the last 168 hours.  Coagulation Profile: No results for input(s): INR, PROTIME in the last 168 hours.  Cardiac Enzymes: No results for input(s): CKTOTAL, CKMB, CKMBINDEX, TROPONINI in the last 168 hours.  BNP (last 3 results) No results for input(s): PROBNP in the last 8760 hours.  Lipid Profile: No results for input(s): CHOL, HDL, LDLCALC, TRIG, CHOLHDL, LDLDIRECT in the last 72 hours.  Thyroid Function Tests: No results for input(s): TSH, T4TOTAL, FREET4, T3FREE,  THYROIDAB in the last 72 hours.  Anemia Panel: Recent Labs    04/08/21 0402 04/09/21 0635  FERRITIN 122 262     Urine analysis:    Component Value Date/Time   COLORURINE Yellow 06/03/2014 1536   APPEARANCEUR Clear 10/10/2020 0937   LABSPEC 1.013 06/03/2014 1536   PHURINE 5.0 06/03/2014 1536   GLUCOSEU Negative 10/10/2020 0937   GLUCOSEU Negative 06/03/2014 1536   HGBUR Negative 06/03/2014 1536   BILIRUBINUR Negative 10/10/2020 0937   BILIRUBINUR Negative 06/03/2014 1536   KETONESUR Negative 06/03/2014 1536   PROTEINUR Negative 10/10/2020 0937   PROTEINUR Negative 06/03/2014 1536   NITRITE Negative 10/10/2020 0937   NITRITE Negative 06/03/2014 1536   LEUKOCYTESUR Negative 10/10/2020 0937   LEUKOCYTESUR Negative 06/03/2014 1536    Sepsis Labs: Lactic Acid, Venous    Component Value Date/Time   LATICACIDVEN 1.2 04/08/2021 0402    MICROBIOLOGY: Recent Results (from the past 240 hour(s))  Resp Panel by RT-PCR (Flu A&B, Covid) Nasopharyngeal Swab     Status: Abnormal   Collection Time: 04/08/21  4:02 AM   Specimen: Nasopharyngeal Swab; Nasopharyngeal(NP) swabs in vial transport medium  Result Value Ref Range Status   SARS Coronavirus 2 by RT PCR POSITIVE (A) NEGATIVE Final    Comment: RESULT CALLED TO, READ BACK BY AND VERIFIED WITH: TOM NAGY AT [id number] 04/08/21.PMF (NOTE) SARS-CoV-2 target nucleic acids are DETECTED.  The SARS-CoV-2 RNA is generally detectable in upper respiratory specimens during the acute phase of infection. Positive results are indicative of the presence of the identified virus, but do not rule out bacterial infection or co-infection with other pathogens not detected by the test. Clinical correlation with patient history and other diagnostic information  is necessary to determine patient infection status. The expected result is Negative.  Fact Sheet for Patients: EntrepreneurPulse.com.au  Fact Sheet for Healthcare  Providers: IncredibleEmployment.be  This test is not yet approved or cleared by the [place] FDA and  has been authorized for detection and/or diagnosis of SARS-CoV-2 by FDA under an Emergency Use Authorization (EUA).  This EUA will remain in effect (meaning this test can be used)  for the duration of  the COVID-19 declaration under Section 564(b)(1) of the Act, 21 U.S.C. section 360bbb-3(b)(1), unless the authorization is terminated or revoked sooner.     Influenza A by PCR NEGATIVE NEGATIVE Final   Influenza B by PCR NEGATIVE NEGATIVE Final    Comment: (NOTE) The Xpert Xpress SARS-CoV-2/FLU/RSV plus assay is intended as an aid in the diagnosis of influenza from Nasopharyngeal swab specimens and should not be used as a sole basis for treatment. Nasal washings and aspirates are unacceptable for Xpert Xpress SARS-CoV-2/FLU/RSV testing.  Fact Sheet for Patients: EntrepreneurPulse.com.au  Fact Sheet for Healthcare Providers: IncredibleEmployment.be  This test is not yet approved or cleared by the [place] FDA and has been authorized for detection and/or diagnosis of SARS-CoV-2 by FDA under an Emergency Use Authorization (EUA). This EUA will remain in effect (meaning this test can be used) for the duration of the COVID-19 declaration under Section 564(b)(1) of the Act, 21 U.S.C. section 360bbb-3(b)(1), unless the authorization is terminated or revoked.  Performed at [facility], [address], [place], [place] [address]     RADIOLOGY STUDIES/RESULTS: No results found.   LOS: 2 days   Oren Binet, MD  [facility]    To contact the attending provider between 7A-7P or the covering provider during after hours 7P-7A, please log into the web site [email or web address] and access using universal [place] password for that web site. If you do not have the password, please call the hospital  operator.  04/10/2021, 11:23 AM

## 2021-04-11 LAB — COMPREHENSIVE METABOLIC PANEL
ALT: 39 U/L (ref 0–44)
AST: 51 U/L — ABNORMAL HIGH (ref 15–41)
Albumin: 3.2 g/dL — ABNORMAL LOW (ref 3.5–5.0)
Alkaline Phosphatase: 34 U/L — ABNORMAL LOW (ref 38–126)
Anion gap: 8 (ref 5–15)
BUN: 44 mg/dL — ABNORMAL HIGH (ref 8–23)
CO2: 28 mmol/L (ref 22–32)
Calcium: 8.6 mg/dL — ABNORMAL LOW (ref 8.9–10.3)
Chloride: 106 mmol/L (ref 98–111)
Creatinine, Ser: 1.23 mg/dL (ref 0.61–1.24)
GFR, Estimated: 58 mL/min — ABNORMAL LOW (ref >60)
Glucose, Bld: 165 mg/dL — ABNORMAL HIGH (ref 70–99)
Potassium: 4.5 mmol/L (ref 3.5–5.1)
Sodium: 142 mmol/L (ref 135–145)
Total Bilirubin: 1 mg/dL (ref 0.3–1.2)
Total Protein: 5.9 g/dL — ABNORMAL LOW (ref 6.5–8.1)

## 2021-04-11 LAB — CBC WITH DIFFERENTIAL/PLATELET
Abs Immature Granulocytes: 0.06 10*3/uL (ref 0.00–0.07)
Basophils Absolute: 0 10*3/uL (ref 0.0–0.1)
Basophils Relative: 0 %
Eosinophils Absolute: 0 10*3/uL (ref 0.0–0.5)
Eosinophils Relative: 0 %
HCT: 41.8 % (ref 39.0–52.0)
Hemoglobin: 14.5 g/dL (ref 13.0–17.0)
Immature Granulocytes: 1 %
Lymphocytes Relative: 16 %
Lymphs Abs: 1 10*3/uL (ref 0.7–4.0)
MCH: 33.2 pg (ref 26.0–34.0)
MCHC: 34.7 g/dL (ref 30.0–36.0)
MCV: 95.7 fL (ref 80.0–100.0)
Monocytes Absolute: 0.2 10*3/uL (ref 0.1–1.0)
Monocytes Relative: 3 %
Neutro Abs: 4.9 10*3/uL (ref 1.7–7.7)
Neutrophils Relative %: 80 %
Platelets: 103 10*3/uL — ABNORMAL LOW (ref 150–400)
RBC: 4.37 MIL/uL (ref 4.22–5.81)
RDW: 12.4 % (ref 11.5–15.5)
WBC: 6.1 10*3/uL (ref 4.0–10.5)
nRBC: 0 % (ref 0.0–0.2)

## 2021-04-11 LAB — C-REACTIVE PROTEIN: CRP: 8.3 mg/dL — ABNORMAL HIGH (ref <1.0)

## 2021-04-11 LAB — D-DIMER, QUANTITATIVE: D-Dimer, Quant: 0.5 ug/mL-FEU (ref 0.00–0.50)

## 2021-04-11 NOTE — Plan of Care (Signed)
  Problem: Education: Goal: Knowledge of General Education information will improve Description: Including pain rating scale, medication(s)/side effects and non-pharmacologic comfort measures Outcome: Progressing   Problem: Clinical Measurements: Goal: Respiratory complications will improve Outcome: Progressing   Problem: Activity: Goal: Risk for activity intolerance will decrease Outcome: Progressing   Problem: Coping: Goal: Level of anxiety will decrease Outcome: Progressing   Problem: Elimination: Goal: Will not experience complications related to urinary retention Outcome: Progressing   Problem: Safety: Goal: Ability to remain free from injury will improve Outcome: Progressing   Problem: Skin Integrity: Goal: Risk for impaired skin integrity will decrease Outcome: Progressing

## 2021-04-11 NOTE — Progress Notes (Signed)
PROGRESS NOTE        PATIENT DETAILS Name: [name] Age: 87 y.o. Sex: male Date of Birth: 01-28-1934 Admit Date: 04/08/2021 Admitting Physician Athena Masse, MD [id number], [name], MD  Brief Narrative: Patient is a 87 y.o. male with history of HTN, GERD, prior CVA-who was diagnosed with COVID infection on 8/3-presented with shortness of breath-he was found to have acute hypoxic respiratory failure due to COVID-19 pneumonia.  Significant events: 8/3>> COVID-19 positive 8/6>> admit for acute hypoxic respiratory failure due to COVID-19 pneumonia  Significant studies: 8/6>> CXR: Atypical pneumonia.  COVID-19 medications: Steroids: 8/6>> Remdesivir: 8/6>>  Microbiology data: None  Procedures : None  Consults: None  DVT Prophylaxis : enoxaparin (LOVENOX) injection 40 mg Start: 04/09/21 2200 SCDs Start: 04/08/21 0855 Start prophylactic Lovenox-watch platelet count closely.   Subjective: Cough better-sore throat is better-he is not able to eat.  Just on 1 L of oxygen.  Overall slowly improving.  Assessment/Plan: Acute hypoxic respiratory failure due to COVID-19 pneumonia: Hypoxia has improved-CRP downtrending-continue steroid/Remdesivir-have asked RN to see if he can titrate off oxygen today.  If clinical improvement continues-plan is to discharge home tomorrow.    Recent Labs    04/09/21 0635 04/10/21 0532 04/11/21 0659  DDIMER 1.65* 0.71* 0.50  FERRITIN 262  --   --   CRP 28.0* 15.5* 8.3*     Lab Results  Component Value Date   SARSCOV2NAA POSITIVE (A) 04/08/2021      Chronic thrombocytopenia-history of ITP: Worsened due to acute infection with COVID.  Discussed with patient-given ongoing COVID-19 infection-and mild thrombocytopenia-recommend that he continue to stay on prophylactic dosing of Lovenox.  We will plan to monitor CBC very closely.    GERD: Continue PPI  BPH: Continue finasteride  HLD: Continue  Zetia  Diet: Diet Order             DIET SOFT Room service appropriate? Yes; Fluid consistency: Thin  Diet effective now                    Code Status: Full code   Family Communication: Spoke with spouse-Barbara Harkleroad-[phone] on 8/8   Disposition Plan: Status is: Inpatient  Remains inpatient appropriate because:Inpatient level of care appropriate due to severity of illness  Dispo: The patient is from: Home              Anticipated d/c is to: Home              Patient currently is not medically stable to d/c.   Difficult to place patient No   Barriers to Discharge: COVID-19 pneumonia with hypoxemia-on O2 supplementation-on Remdesivir x5 days.  Antimicrobial agents: Anti-infectives (From admission, onward)    Start     Dose/Rate Route Frequency Ordered Stop   04/09/21 1000  remdesivir 100 mg in sodium chloride 0.9 % 100 mL IVPB  Status:  Discontinued       See Hyperspace for full Linked Orders Report.   100 mg 200 mL/hr over 30 Minutes Intravenous Daily 04/08/21 0615 04/08/21 0633   04/09/21 1000  remdesivir 100 mg in sodium chloride 0.9 % 100 mL IVPB  Status:  Discontinued        100 mg 200 mL/hr over 30 Minutes Intravenous Daily 04/08/21 0634 04/08/21 1106   04/09/21 1000  remdesivir 100 mg in sodium  chloride 0.9 % 100 mL IVPB       See Hyperspace for full Linked Orders Report.   100 mg 200 mL/hr over 30 Minutes Intravenous Daily 04/08/21 1106 04/13/21 0959   04/08/21 1300  remdesivir 200 mg in sodium chloride 0.9% 250 mL IVPB       See Hyperspace for full Linked Orders Report.   200 mg 580 mL/hr over 30 Minutes Intravenous Once 04/08/21 1106 04/08/21 1750   04/08/21 0645  remdesivir 100 mg in sodium chloride 0.9 % 100 mL IVPB  Status:  Discontinued       See Hyperspace for full Linked Orders Report.   100 mg 200 mL/hr over 30 Minutes Intravenous  Once 04/08/21 [id number] 04/08/21 1106   04/08/21 0645  remdesivir 100 mg in sodium chloride 0.9 % 100 mL IVPB   Status:  Discontinued       See Hyperspace for full Linked Orders Report.   100 mg 200 mL/hr over 30 Minutes Intravenous  Once 04/08/21 [id number] 04/08/21 1106   04/08/21 0615  remdesivir 200 mg in sodium chloride 0.9% 250 mL IVPB  Status:  Discontinued       See Hyperspace for full Linked Orders Report.   200 mg 580 mL/hr over 30 Minutes Intravenous Once 04/08/21 0615 04/08/21 0634        Time spent: 25 minutes-Greater than 50% of this time was spent in counseling, explanation of diagnosis, planning of further management, and coordination of care.  MEDICATIONS: Scheduled Meds:  albuterol  2 puff Inhalation Q6H   vitamin C  500 mg Oral Daily   benzonatate  200 mg Oral TID   calcium-vitamin D  1 tablet Oral Q breakfast   enoxaparin (LOVENOX) injection  40 mg Subcutaneous Q24H   ezetimibe  10 mg Oral Daily   finasteride  5 mg Oral Daily   folic acid  1 mg Oral Daily   guaiFENesin  600 mg Oral BID   multivitamin with minerals   Oral Daily   pantoprazole  40 mg Oral Daily   predniSONE  50 mg Oral Daily   sodium chloride flush  3 mL Intravenous Q12H   zinc sulfate  220 mg Oral Daily   Continuous Infusions:  sodium chloride     remdesivir 100 mg in NS 100 mL 100 mg (04/11/21 0831)   PRN Meds:.sodium chloride, acetaminophen, chlorpheniramine-HYDROcodone, lidocaine, menthol-cetylpyridinium, ondansetron **OR** ondansetron (ZOFRAN) IV, phenol, sodium chloride flush   PHYSICAL EXAM: Vital signs: Vitals:   04/11/21 0012 04/11/21 0452 04/11/21 0844 04/11/21 1119  BP: 108/66 (!) 112/58 123/60 (!) 145/66  Pulse: (!) 55 (!) 53 (!) 50 (!) 52  Resp: '18 16 18 18  '$ Temp: 97.7 F (36.5 C) 97.7 F (36.5 C) (!) 97.4 F (36.3 C) 97.9 F (36.6 C)  TempSrc: Oral Oral Oral Oral  SpO2: 96% 96% 99% 99%  Weight:      Height:       Filed Weights   04/08/21 0352 04/08/21 0914  Weight: 73.9 kg 74.3 kg   Body mass index is 23.5 kg/m.   Gen Exam:Alert awake-not in any  distress HEENT:atraumatic, normocephalic Chest: B/L clear to auscultation anteriorly CVS:S1S2 regular Abdomen:soft non tender, non distended Extremities:no edema Neurology: Non focal Skin: no rash   I have personally reviewed following labs and imaging studies  LABORATORY DATA: CBC: Recent Labs  Lab 04/08/21 0402 04/09/21 0635 04/10/21 0532 04/11/21 0659  WBC 6.3 6.7 7.6 6.1  NEUTROABS 4.8 5.3 6.0 4.9  HGB  17.5* 16.3 15.2 14.5  HCT 50.1 47.7 44.0 41.8  MCV 96.0 95.4 94.8 95.7  PLT 99* 90* 93* 103*     Basic Metabolic Panel: Recent Labs  Lab 04/08/21 0402 04/09/21 0635 04/10/21 0532 04/11/21 0659  NA 139 140 142 142  K 4.5 3.8 4.4 4.5  CL 105 106 106 106  CO2 '30 25 29 28  '$ GLUCOSE 127* 171* 169* 165*  BUN 19 37* 47* 44*  CREATININE 1.36* 1.16 1.14 1.23  CALCIUM 9.0 8.6* 8.8* 8.6*  MG  --  2.1  --   --   PHOS  --  2.8  --   --      GFR: Estimated Creatinine Clearance: 45.3 mL/min (by C-G formula based on SCr of 1.23 mg/dL).  Liver Function Tests: Recent Labs  Lab 04/08/21 0402 04/09/21 0635 04/10/21 0532 04/11/21 0659  AST 26 36 37 51*  ALT '20 15 17 '$ 39  ALKPHOS 50 38 32* 34*  BILITOT 1.5* 1.5* 1.2 1.0  PROT 7.7 6.8 6.2* 5.9*  ALBUMIN 4.8 3.8 3.4* 3.2*    No results for input(s): LIPASE, AMYLASE in the last 168 hours. No results for input(s): AMMONIA in the last 168 hours.  Coagulation Profile: No results for input(s): INR, PROTIME in the last 168 hours.  Cardiac Enzymes: No results for input(s): CKTOTAL, CKMB, CKMBINDEX, TROPONINI in the last 168 hours.  BNP (last 3 results) No results for input(s): PROBNP in the last 8760 hours.  Lipid Profile: No results for input(s): CHOL, HDL, LDLCALC, TRIG, CHOLHDL, LDLDIRECT in the last 72 hours.  Thyroid Function Tests: No results for input(s): TSH, T4TOTAL, FREET4, T3FREE, THYROIDAB in the last 72 hours.  Anemia Panel: Recent Labs    04/09/21 0635  FERRITIN 262     Urine analysis:     Component Value Date/Time   COLORURINE Yellow 06/03/2014 1536   APPEARANCEUR Clear 10/10/2020 0937   LABSPEC 1.013 06/03/2014 1536   PHURINE 5.0 06/03/2014 1536   GLUCOSEU Negative 10/10/2020 0937   GLUCOSEU Negative 06/03/2014 1536   HGBUR Negative 06/03/2014 1536   BILIRUBINUR Negative 10/10/2020 0937   BILIRUBINUR Negative 06/03/2014 1536   KETONESUR Negative 06/03/2014 1536   PROTEINUR Negative 10/10/2020 0937   PROTEINUR Negative 06/03/2014 1536   NITRITE Negative 10/10/2020 0937   NITRITE Negative 06/03/2014 1536   LEUKOCYTESUR Negative 10/10/2020 0937   LEUKOCYTESUR Negative 06/03/2014 1536    Sepsis Labs: Lactic Acid, Venous    Component Value Date/Time   LATICACIDVEN 1.2 04/08/2021 0402    MICROBIOLOGY: Recent Results (from the past 240 hour(s))  Resp Panel by RT-PCR (Flu A&B, Covid) Nasopharyngeal Swab     Status: Abnormal   Collection Time: 04/08/21  4:02 AM   Specimen: Nasopharyngeal Swab; Nasopharyngeal(NP) swabs in vial transport medium  Result Value Ref Range Status   SARS Coronavirus 2 by RT PCR POSITIVE (A) NEGATIVE Final    Comment: RESULT CALLED TO, READ BACK BY AND VERIFIED WITH: TOM NAGY AT [id number] 04/08/21.PMF (NOTE) SARS-CoV-2 target nucleic acids are DETECTED.  The SARS-CoV-2 RNA is generally detectable in upper respiratory specimens during the acute phase of infection. Positive results are indicative of the presence of the identified virus, but do not rule out bacterial infection or co-infection with other pathogens not detected by the test. Clinical correlation with patient history and other diagnostic information is necessary to determine patient infection status. The expected result is Negative.  Fact Sheet for Patients: EntrepreneurPulse.com.au  Fact Sheet for Healthcare Providers: IncredibleEmployment.be  This test is not yet approved or cleared by the [place] and  has been authorized for  detection and/or diagnosis of SARS-CoV-2 by FDA under an Emergency Use Authorization (EUA).  This EUA will remain in effect (meaning this test can be used)  for the duration of  the COVID-19 declaration under Section 564(b)(1) of the Act, 21 U.S.C. section 360bbb-3(b)(1), unless the authorization is terminated or revoked sooner.     Influenza A by PCR NEGATIVE NEGATIVE Final   Influenza B by PCR NEGATIVE NEGATIVE Final    Comment: (NOTE) The Xpert Xpress SARS-CoV-2/FLU/RSV plus assay is intended as an aid in the diagnosis of influenza from Nasopharyngeal swab specimens and should not be used as a sole basis for treatment. Nasal washings and aspirates are unacceptable for Xpert Xpress SARS-CoV-2/FLU/RSV testing.  Fact Sheet for Patients: EntrepreneurPulse.com.au  Fact Sheet for Healthcare Providers: IncredibleEmployment.be  This test is not yet approved or cleared by the [place] FDA and has been authorized for detection and/or diagnosis of SARS-CoV-2 by FDA under an Emergency Use Authorization (EUA). This EUA will remain in effect (meaning this test can be used) for the duration of the COVID-19 declaration under Section 564(b)(1) of the Act, 21 U.S.C. section 360bbb-3(b)(1), unless the authorization is terminated or revoked.  Performed at [facility], [address] [place], [place] [address]     RADIOLOGY STUDIES/RESULTS: No results found.   LOS: 3 days   Oren Binet, MD  [facility]    To contact the attending provider between 7A-7P or the covering provider during after hours 7P-7A, please log into the web site [email or web address] and access using universal [place] password for that web site. If you do not have the password, please call the hospital operator.  04/11/2021, 2:24 PM

## 2021-04-12 LAB — COMPREHENSIVE METABOLIC PANEL
ALT: 99 U/L — ABNORMAL HIGH (ref 0–44)
AST: 68 U/L — ABNORMAL HIGH (ref 15–41)
Albumin: 3.1 g/dL — ABNORMAL LOW (ref 3.5–5.0)
Alkaline Phosphatase: 34 U/L — ABNORMAL LOW (ref 38–126)
Anion gap: 8 (ref 5–15)
BUN: 35 mg/dL — ABNORMAL HIGH (ref 8–23)
CO2: 30 mmol/L (ref 22–32)
Calcium: 8.5 mg/dL — ABNORMAL LOW (ref 8.9–10.3)
Chloride: 104 mmol/L (ref 98–111)
Creatinine, Ser: 1.22 mg/dL (ref 0.61–1.24)
GFR, Estimated: 58 mL/min — ABNORMAL LOW (ref >60)
Glucose, Bld: 110 mg/dL — ABNORMAL HIGH (ref 70–99)
Potassium: 3.9 mmol/L (ref 3.5–5.1)
Sodium: 142 mmol/L (ref 135–145)
Total Bilirubin: 1.2 mg/dL (ref 0.3–1.2)
Total Protein: 5.8 g/dL — ABNORMAL LOW (ref 6.5–8.1)

## 2021-04-12 LAB — CBC WITH DIFFERENTIAL/PLATELET
Abs Immature Granulocytes: 0.07 10*3/uL (ref 0.00–0.07)
Basophils Absolute: 0 10*3/uL (ref 0.0–0.1)
Basophils Relative: 0 %
Eosinophils Absolute: 0 10*3/uL (ref 0.0–0.5)
Eosinophils Relative: 0 %
HCT: 41.1 % (ref 39.0–52.0)
Hemoglobin: 14.4 g/dL (ref 13.0–17.0)
Immature Granulocytes: 1 %
Lymphocytes Relative: 20 %
Lymphs Abs: 1.3 10*3/uL (ref 0.7–4.0)
MCH: 33 pg (ref 26.0–34.0)
MCHC: 35 g/dL (ref 30.0–36.0)
MCV: 94.1 fL (ref 80.0–100.0)
Monocytes Absolute: 0.5 10*3/uL (ref 0.1–1.0)
Monocytes Relative: 8 %
Neutro Abs: 4.5 10*3/uL (ref 1.7–7.7)
Neutrophils Relative %: 71 %
Platelets: 102 10*3/uL — ABNORMAL LOW (ref 150–400)
RBC: 4.37 MIL/uL (ref 4.22–5.81)
RDW: 12.2 % (ref 11.5–15.5)
WBC: 6.4 10*3/uL (ref 4.0–10.5)
nRBC: 0 % (ref 0.0–0.2)

## 2021-04-12 LAB — C-REACTIVE PROTEIN: CRP: 4.7 mg/dL — ABNORMAL HIGH (ref <1.0)

## 2021-04-12 LAB — D-DIMER, QUANTITATIVE: D-Dimer, Quant: 0.55 ug/mL-FEU — ABNORMAL HIGH (ref 0.00–0.50)

## 2021-04-12 MED ORDER — BENZONATATE 100 MG PO CAPS: 100.0000 mg | ORAL_CAPSULE | Freq: Three times a day (TID) | ORAL | 0 refills | Status: DC | PRN

## 2021-04-12 MED ORDER — MENTHOL 3 MG MT LOZG: 1.0000 | LOZENGE | OROMUCOSAL | 0 refills | Status: DC | PRN

## 2021-04-12 MED ORDER — ALBUTEROL SULFATE HFA 108 (90 BASE) MCG/ACT IN AERS: 2.0000 | INHALATION_SPRAY | Freq: Four times a day (QID) | RESPIRATORY_TRACT | 0 refills | Status: DC | PRN

## 2021-04-12 MED ORDER — PREDNISONE 10 MG PO TABS: ORAL_TABLET | ORAL | 0 refills | Status: DC

## 2021-04-12 NOTE — Progress Notes (Signed)
Patient is being discharged home to self care. Belongings returned. IV removed. Discharge instructions given, and patient educated on discharge instruction. Waiting for his transportation home

## 2021-04-12 NOTE — Discharge Summary (Signed)
PATIENT DETAILS Name: [name] Age: 87 y.o. Sex: male Date of Birth: 10/25/1933 MRN: [id number]. Admitting Physician: Athena Masse, MD [id number], [name], MD  Admit Date: 04/08/2021 Discharge date: 04/12/2021  Recommendations for Outpatient Follow-up:  Follow up with PCP in 1-2 weeks Please obtain CMP/CBC in one week Please repeat two-view chest x-ray in 4 to 6 weeks to ensure resolution of pneumonia.  Admitted From:  Home  Disposition: [name]: No  Equipment/Devices: None  Discharge Condition: Stable  CODE STATUS: FULL CODE  Diet recommendation:  Diet Order             Diet general           DIET SOFT Room service appropriate? Yes; Fluid consistency: Thin  Diet effective now                    Brief Narrative: Patient is a incredibly healthy 87 y.o. male (avid cyclist) with history of HTN, GERD, prior CVA-who was diagnosed with COVID infection on 8/3-presented with shortness of breath-he was found to have acute hypoxic respiratory failure due to COVID-19 pneumonia.   Significant events: 8/3>> COVID-19 positive 8/6>> admit for acute hypoxic respiratory failure due to COVID-19 pneumonia   Significant studies: 8/6>> CXR: Atypical pneumonia.   COVID-19 medications: Steroids: 8/6>> Remdesivir: 8/6>> 8/10   Microbiology data: None   Procedures : None   Consults: None  Brief Hospital Course: Acute hypoxic respiratory failure due to COVID-19 pneumonia: Had mild hypoxemia requiring up to 2 L of oxygen-however CRP was significantly elevated.  Patient was treated with steroids and Remdesivir.  He has gradually improved-he has been titrated off oxygen as of 8/9.  He is overall much better-his sore throat has essentially resolved.  He continues to have cough but it is much milder than what it was when he first presented to the hospital.  He has completed a course of Remdesivir-and will be discharged home in a stable manner on tapering  steroids.  He has been asked to follow with his primary care practitioner-he will need a repeat two-view chest x-ray in 4 to 6 weeks.  He understands that if in the unlikely event that he develops worsening shortness of breath-he needs to seek immediate medical attention.    Chronic thrombocytopenia-history of ITP: Worsened due to acute infection with COVID.  Platelets count have improved with just supportive care-and improvement of his underlying COVID-19 infection.  Mild transaminitis: Likely due to combination of COVID-19 and Remdesivir use.  PCP to repeat LFTs in 1 week.  Doubt any further work-up required while inpatient.   GERD: Continue PPI   BPH: Continue finasteride   HLD: Continue Zetia  Procedures None  Discharge Diagnoses:  Principal Problem:   Pneumonia due to COVID-19 virus Active Problems:   Essential (primary) hypertension   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HCC)   Acute respiratory failure due to COVID-19 [facility])   GERD (gastroesophageal reflux disease)   Discharge Instructions:  Activity:  As tolerated  Discharge Instructions     Call MD for:  difficulty breathing, headache or visual disturbances   Complete by: As directed    Diet general   Complete by: As directed    Discharge instructions   Complete by: As directed    Follow with Primary MD  [name], MD in 1-2 weeks  Please get a complete blood count and chemistry panel checked by your Primary MD at your next visit, and again as instructed by  your Primary MD.  Get Medicines reviewed and adjusted: Please take all your medications with you for your next visit with your Primary MD  Laboratory/radiological data: Please request your Primary MD to go over all hospital tests and procedure/radiological results at the follow up, please ask your Primary MD to get all Hospital records sent to his/her office.  In some cases, they will be blood work, cultures and biopsy results pending at the time of  your discharge. Please request that your primary care M.D. follows up on these results.  Also Note the following: If you experience worsening of your admission symptoms, develop shortness of breath, life threatening emergency, suicidal or homicidal thoughts you must seek medical attention immediately by calling 911 or calling your MD immediately  if symptoms less severe.  You must read complete instructions/literature along with all the possible adverse reactions/side effects for all the Medicines you take and that have been prescribed to you. Take any new Medicines after you have completely understood and accpet all the possible adverse reactions/side effects.   Do not drive when taking Pain medications or sleeping medications (Benzodaizepines)  Do not take more than prescribed Pain, Sleep and Anxiety Medications. It is not advisable to combine anxiety,sleep and pain medications without talking with your primary care practitioner  Special Instructions: If you have smoked or chewed Tobacco  in the last 2 yrs please stop smoking, stop any regular Alcohol  and or any Recreational drug use.  Wear Seat belts while driving.  Please note: You were cared for by a hospitalist during your hospital stay. Once you are discharged, your primary care physician will handle any further medical issues. Please note that NO REFILLS for any discharge medications will be authorized once you are discharged, as it is imperative that you return to your primary care physician (or establish a relationship with a primary care physician if you do not have one) for your post hospital discharge needs so that they can reassess your need for medications and monitor your lab values.   1.  10 days of isolation from the day of her first positive COVID test or from the first day of her diagnoses.  2.  Please ask your primary care practitioner to repeat a two-view chest x-ray in 4 to 6 weeks  3.  If you develop worsening shortness  of breath please seek immediate medical attention.   Increase activity slowly   Complete by: As directed       Allergies as of 04/12/2021       Reactions   Other Diarrhea   Other reaction(s): Abdominal Pain tarragon Other reaction(s): Abdominal Pain tarragon        Medication List     STOP taking these medications    methotrexate 2.5 MG tablet   Nirmatrelvir & Ritonavir 20 x 150 MG & 10 x '100MG'$  Tbpk       TAKE these medications    albuterol 108 (90 Base) MCG/ACT inhaler Commonly known as: VENTOLIN HFA Inhale 2 puffs into the lungs every 6 (six) hours as needed for wheezing or shortness of breath.   benzonatate 100 MG capsule Commonly known as: Tessalon Perles Take 1 capsule (100 mg total) by mouth 3 (three) times daily as needed for cough.   calcium-vitamin D 500-200 MG-UNIT tablet Commonly known as: OSCAL WITH D Take 1 tablet by mouth daily with breakfast.   clobetasol ointment 0.05 % Commonly known as: TEMOVATE Apply topically.   desonide 0.05 % cream Commonly known  as: DESOWEN Apply 1 application topically daily as needed.   ezetimibe 10 MG tablet Commonly known as: ZETIA Take 1 tablet (10 mg total) by mouth daily.   finasteride 5 MG tablet Commonly known as: PROSCAR Take 1 tablet (5 mg total) by mouth daily.   folic acid 1 MG tablet Commonly known as: FOLVITE Take 1 tablet by mouth. 6 times a week   ketoconazole 2 % shampoo Commonly known as: NIZORAL Apply topically 2 (two) times a week.   menthol-cetylpyridinium 3 MG lozenge Commonly known as: CEPACOL Take 1 lozenge (3 mg total) by mouth as needed for sore throat.   Multi-Vitamins Tabs Take by mouth daily.   omeprazole 20 MG capsule Commonly known as: PRILOSEC Take 20 mg by mouth daily.   predniSONE 10 MG tablet Commonly known as: DELTASONE Take 40 mg daily for 1 day, 30 mg daily for 1 day, 20 mg daily for 1 days,10 mg daily for 1 day, then stop   tacrolimus 0.1 %  ointment Commonly known as: PROTOPIC   Testosterone 20.25 MG/ACT (1.62%) Gel Apply to shoulders and/or upper arms only 5 grams   valACYclovir 1000 MG tablet Commonly known as: VALTREX Take 500 mg by mouth daily.        Follow-up Information     [name], MD. Schedule an appointment as soon as possible for a visit in 1 week(s).   Specialty: Family Medicine Contact information: [address] [place] [address] [phone]                Allergies  Allergen Reactions   Other Diarrhea    Other reaction(s): Abdominal Pain tarragon Other reaction(s): Abdominal Pain tarragon      Consultations:  None   Other Procedures/Studies: DG Chest Port 1 View  Result Date: 04/08/2021 CLINICAL DATA:  Shortness of breath EXAM: PORTABLE CHEST 1 VIEW COMPARISON:  05/19/2005 FINDINGS: Low volume chest. Normal heart size and mediastinal contours. Indistinct patchy density suspected at the bilateral chest. No visible effusion or pneumothorax. IMPRESSION: Suspect atypical pneumonia. Electronically Signed   By: Monte Fantasia M.D.   On: 04/08/2021 04:21     TODAY-DAY OF DISCHARGE:  Subjective:   [name] today has no headache,no chest abdominal pain,no new weakness tingling or numbness, feels much better wants to go home today.  Objective:   Blood pressure 133/81, pulse (!) 53, temperature 97.8 F (36.6 C), temperature source Oral, resp. rate 16, height '5\' 10"'$  (1.778 m), weight 74.3 kg, SpO2 98 %.  Intake/Output Summary (Last 24 hours) at 04/12/2021 1035 Last data filed at 04/11/2021 2100 Gross per 24 hour  Intake 220 ml  Output --  Net 220 ml   Filed Weights   04/08/21 0352 04/08/21 0914  Weight: 73.9 kg 74.3 kg    Exam: Awake Alert, Oriented *3, No new F.N deficits, Normal affect [place].AT,PERRAL Supple Neck,No JVD, No cervical lymphadenopathy appriciated.  Symmetrical Chest wall movement, Good air movement bilaterally, CTAB RRR,No Gallops,Rubs or new  Murmurs, No Parasternal Heave +ve B.Sounds, Abd Soft, Non tender, No organomegaly appriciated, No rebound -guarding or rigidity. No Cyanosis, Clubbing or edema, No new Rash or bruise   PERTINENT RADIOLOGIC STUDIES: No results found.   PERTINENT LAB RESULTS: CBC: Recent Labs    04/11/21 0659 04/12/21 0618  WBC 6.1 6.4  HGB 14.5 14.4  HCT 41.8 41.1  PLT 103* 102*   CMET CMP     Component Value Date/Time   NA 142 04/12/2021 0618  NA 142 06/03/2014 1536   K 3.9 04/12/2021 0618   K 3.7 06/03/2014 1536   CL 104 04/12/2021 0618   CL 106 06/03/2014 1536   CO2 30 04/12/2021 0618   CO2 31 06/03/2014 1536   GLUCOSE 110 (H) 04/12/2021 0618   GLUCOSE 121 (H) 06/03/2014 1536   BUN 35 (H) 04/12/2021 0618   BUN 18 06/03/2014 1536   CREATININE 1.22 04/12/2021 0618   CREATININE 1.24 06/03/2014 1536   CALCIUM 8.5 (L) 04/12/2021 0618   CALCIUM 8.2 (L) 06/03/2014 1536   PROT 5.8 (L) 04/12/2021 0618   ALBUMIN 3.1 (L) 04/12/2021 0618   AST 68 (H) 04/12/2021 0618   ALT 99 (H) 04/12/2021 0618   ALKPHOS 34 (L) 04/12/2021 0618   BILITOT 1.2 04/12/2021 0618   GFRNONAA 58 (L) 04/12/2021 0618   GFRNONAA 60 (L) 06/03/2014 1536   GFRAA >60 12/14/2019 1317   GFRAA >60 06/03/2014 1536    GFR Estimated Creatinine Clearance: 45.7 mL/min (by C-G formula based on SCr of 1.22 mg/dL). No results for input(s): LIPASE, AMYLASE in the last 72 hours. No results for input(s): CKTOTAL, CKMB, CKMBINDEX, TROPONINI in the last 72 hours. Invalid input(s): POCBNP Recent Labs    04/11/21 0659 04/12/21 0618  DDIMER 0.50 0.55*   No results for input(s): HGBA1C in the last 72 hours. No results for input(s): CHOL, HDL, LDLCALC, TRIG, CHOLHDL, LDLDIRECT in the last 72 hours. No results for input(s): TSH, T4TOTAL, T3FREE, THYROIDAB in the last 72 hours.  Invalid input(s): FREET3 No results for input(s): VITAMINB12, FOLATE, FERRITIN, TIBC, IRON, RETICCTPCT in the last 72 hours. Coags: No results for  input(s): INR in the last 72 hours.  Invalid input(s): PT Microbiology: Recent Results (from the past 240 hour(s))  Resp Panel by RT-PCR (Flu A&B, Covid) Nasopharyngeal Swab     Status: Abnormal   Collection Time: 04/08/21  4:02 AM   Specimen: Nasopharyngeal Swab; Nasopharyngeal(NP) swabs in vial transport medium  Result Value Ref Range Status   SARS Coronavirus 2 by RT PCR POSITIVE (A) NEGATIVE Final    Comment: RESULT CALLED TO, READ BACK BY AND VERIFIED WITH: TOM NAGY AT [id number] 04/08/21.PMF (NOTE) SARS-CoV-2 target nucleic acids are DETECTED.  The SARS-CoV-2 RNA is generally detectable in upper respiratory specimens during the acute phase of infection. Positive results are indicative of the presence of the identified virus, but do not rule out bacterial infection or co-infection with other pathogens not detected by the test. Clinical correlation with patient history and other diagnostic information is necessary to determine patient infection status. The expected result is Negative.  Fact Sheet for Patients: EntrepreneurPulse.com.au  Fact Sheet for Healthcare Providers: IncredibleEmployment.be  This test is not yet approved or cleared by the [place] FDA and  has been authorized for detection and/or diagnosis of SARS-CoV-2 by FDA under an Emergency Use Authorization (EUA).  This EUA will remain in effect (meaning this test can be used)  for the duration of  the COVID-19 declaration under Section 564(b)(1) of the Act, 21 U.S.C. section 360bbb-3(b)(1), unless the authorization is terminated or revoked sooner.     Influenza A by PCR NEGATIVE NEGATIVE Final   Influenza B by PCR NEGATIVE NEGATIVE Final    Comment: (NOTE) The Xpert Xpress SARS-CoV-2/FLU/RSV plus assay is intended as an aid in the diagnosis of influenza from Nasopharyngeal swab specimens and should not be used as a sole basis for treatment. Nasal washings and aspirates are  unacceptable for Xpert Xpress SARS-CoV-2/FLU/RSV testing.  Fact Sheet for Patients: EntrepreneurPulse.com.au  Fact Sheet for Healthcare Providers: IncredibleEmployment.be  This test is not yet approved or cleared by the [place] FDA and has been authorized for detection and/or diagnosis of SARS-CoV-2 by FDA under an Emergency Use Authorization (EUA). This EUA will remain in effect (meaning this test can be used) for the duration of the COVID-19 declaration under Section 564(b)(1) of the Act, 21 U.S.C. section 360bbb-3(b)(1), unless the authorization is terminated or revoked.  Performed at [facility], [address] [place], [place] [address]     FURTHER DISCHARGE INSTRUCTIONS:  Get Medicines reviewed and adjusted: Please take all your medications with you for your next visit with your Primary MD  Laboratory/radiological data: Please request your Primary MD to go over all hospital tests and procedure/radiological results at the follow up, please ask your Primary MD to get all Hospital records sent to his/her office.  In some cases, they will be blood work, cultures and biopsy results pending at the time of your discharge. Please request that your primary care M.D. goes through all the records of your hospital data and follows up on these results.  Also Note the following: If you experience worsening of your admission symptoms, develop shortness of breath, life threatening emergency, suicidal or homicidal thoughts you must seek medical attention immediately by calling 911 or calling your MD immediately  if symptoms less severe.  You must read complete instructions/literature along with all the possible adverse reactions/side effects for all the Medicines you take and that have been prescribed to you. Take any new Medicines after you have completely understood and accpet all the possible adverse reactions/side effects.   Do not  drive when taking Pain medications or sleeping medications (Benzodaizepines)  Do not take more than prescribed Pain, Sleep and Anxiety Medications. It is not advisable to combine anxiety,sleep and pain medications without talking with your primary care practitioner  Special Instructions: If you have smoked or chewed Tobacco  in the last 2 yrs please stop smoking, stop any regular Alcohol  and or any Recreational drug use.  Wear Seat belts while driving.  Please note: You were cared for by a hospitalist during your hospital stay. Once you are discharged, your primary care physician will handle any further medical issues. Please note that NO REFILLS for any discharge medications will be authorized once you are discharged, as it is imperative that you return to your primary care physician (or establish a relationship with a primary care physician if you do not have one) for your post hospital discharge needs so that they can reassess your need for medications and monitor your lab values.  Total Time spent coordinating discharge including counseling, education and face to face time equals 35 minutes.  SignedOren Binet 04/12/2021 10:35 AM

## 2021-04-12 NOTE — Discharge Instructions (Signed)
Person Under Monitoring Name: [name]  Location: [place] [id number]   Infection Prevention Recommendations for Individuals Confirmed to have, or Being Evaluated for, 2019 Novel Coronavirus (COVID-19) Infection Who Receive Care at Home  Individuals who are confirmed to have, or are being evaluated for, COVID-19 should follow the prevention steps below until a healthcare provider or local or state health department says they can return to normal activities.  Stay home except to get medical care You should restrict activities outside your home, except for getting medical care. Do not go to work, school, or public areas, and do not use public transportation or taxis.  Call ahead before visiting your doctor Before your medical appointment, call the healthcare provider and tell them that you have, or are being evaluated for, COVID-19 infection. This will help the healthcare provider's office take steps to keep other people from getting infected. Ask your healthcare provider to call the local or state health department.  Monitor your symptoms Seek prompt medical attention if your illness is worsening (e.g., difficulty breathing). Before going to your medical appointment, call the healthcare provider and tell them that you have, or are being evaluated for, COVID-19 infection. Ask your healthcare provider to call the local or state health department.  Wear a facemask You should wear a facemask that covers your nose and mouth when you are in the same room with other people and when you visit a healthcare provider. People who live with or visit you should also wear a facemask while they are in the same room with you.  Separate yourself from other people in your home As much as possible, you should stay in a different room from other people in your home. Also, you should use a separate bathroom, if available.  Avoid sharing household items You should not  share dishes, drinking glasses, cups, eating utensils, towels, bedding, or other items with other people in your home. After using these items, you should wash them thoroughly with soap and water.  Cover your coughs and sneezes Cover your mouth and nose with a tissue when you cough or sneeze, or you can cough or sneeze into your sleeve. Throw used tissues in a lined trash can, and immediately wash your hands with soap and water for at least 20 seconds or use an alcohol-based hand rub.  Wash your [facility] your hands often and thoroughly with soap and water for at least 20 seconds. You can use an alcohol-based hand sanitizer if soap and water are not available and if your hands are not visibly dirty. Avoid touching your eyes, nose, and mouth with unwashed hands.   Prevention Steps for Caregivers and Household Members of Individuals Confirmed to have, or Being Evaluated for, COVID-19 Infection Being Cared for in the Home  If you live with, or provide care at home for, a person confirmed to have, or being evaluated for, COVID-19 infection please follow these guidelines to prevent infection:  Follow healthcare provider's instructions Make sure that you understand and can help the patient follow any healthcare provider instructions for all care.  Provide for the patient's basic needs You should help the patient with basic needs in the home and provide support for getting groceries, prescriptions, and other personal needs.  Monitor the patient's symptoms If they are getting sicker, call his or her medical provider and tell them that the patient has, or is being evaluated for, COVID-19 infection. This will help the healthcare provider's  office take steps to keep other people from getting infected. Ask the healthcare provider to call the local or state health department.  Limit the number of people who have contact with the patient If possible, have only one caregiver for the  patient. Other household members should stay in another home or place of residence. If this is not possible, they should stay in another room, or be separated from the patient as much as possible. Use a separate bathroom, if available. Restrict visitors who do not have an essential need to be in the home.  Keep older adults, very young children, and other sick people away from the patient Keep older adults, very young children, and those who have compromised immune systems or chronic health conditions away from the patient. This includes people with chronic heart, lung, or kidney conditions, diabetes, and cancer.  Ensure good ventilation Make sure that shared spaces in the home have good air flow, such as from an air conditioner or an opened window, weather permitting.  Wash your hands often Wash your hands often and thoroughly with soap and water for at least 20 seconds. You can use an alcohol based hand sanitizer if soap and water are not available and if your hands are not visibly dirty. Avoid touching your eyes, nose, and mouth with unwashed hands. Use disposable paper towels to dry your hands. If not available, use dedicated cloth towels and replace them when they become wet.  Wear a facemask and gloves Wear a disposable facemask at all times in the room and gloves when you touch or have contact with the patient's blood, body fluids, and/or secretions or excretions, such as sweat, saliva, sputum, nasal mucus, vomit, urine, or feces.  Ensure the mask fits over your nose and mouth tightly, and do not touch it during use. Throw out disposable facemasks and gloves after using them. Do not reuse. Wash your hands immediately after removing your facemask and gloves. If your personal clothing becomes contaminated, carefully remove clothing and launder. Wash your hands after handling contaminated clothing. Place all used disposable facemasks, gloves, and other waste in a lined container before  disposing them with other household waste. Remove gloves and wash your hands immediately after handling these items.  Do not share dishes, glasses, or other household items with the patient Avoid sharing household items. You should not share dishes, drinking glasses, cups, eating utensils, towels, bedding, or other items with a patient who is confirmed to have, or being evaluated for, COVID-19 infection. After the person uses these items, you should wash them thoroughly with soap and water.  Wash laundry thoroughly Immediately remove and wash clothes or bedding that have blood, body fluids, and/or secretions or excretions, such as sweat, saliva, sputum, nasal mucus, vomit, urine, or feces, on them. Wear gloves when handling laundry from the patient. Read and follow directions on labels of laundry or clothing items and detergent. In general, wash and dry with the warmest temperatures recommended on the label.  Clean all areas the individual has used often Clean all touchable surfaces, such as counters, tabletops, doorknobs, bathroom fixtures, toilets, phones, keyboards, tablets, and bedside tables, every day. Also, clean any surfaces that may have blood, body fluids, and/or secretions or excretions on them. Wear gloves when cleaning surfaces the patient has come in contact with. Use a diluted bleach solution (e.g., dilute bleach with 1 part bleach and 10 parts water) or a household disinfectant with a label that says EPA-registered for coronaviruses. To make a  bleach solution at home, add 1 tablespoon of bleach to 1 quart (4 cups) of water. For a larger supply, add  cup of bleach to 1 gallon (16 cups) of water. Read labels of cleaning products and follow recommendations provided on product labels. Labels contain instructions for safe and effective use of the cleaning product including precautions you should take when applying the product, such as wearing gloves or eye protection and making sure you  have good ventilation during use of the product. Remove gloves and wash hands immediately after cleaning.  Monitor yourself for signs and symptoms of illness Caregivers and household members are considered close contacts, should monitor their health, and will be asked to limit movement outside of the home to the extent possible. Follow the monitoring steps for close contacts listed on the symptom monitoring form.   ? If you have additional questions, contact your local health department or call the epidemiologist on call at [phone] (available 24/7). ? This guidance is subject to change. For the most up-to-date guidance from [facility], please refer to their website: [email or web address]

## 2021-04-12 NOTE — Care Management Important Message (Signed)
Important Message  Patient Details  Name: [name] MRN: [id number] Date of Birth: 04-05-1934   Medicare Important Message Given:  Other (see comment)  Patient is in an isolation room so called his room [phone]) two different times but no answer. Will try again.  [name] 04/12/2021, 11:48 AM

## 2021-09-12 ENCOUNTER — Telehealth: Payer: Self-pay

## 2021-09-12 NOTE — Telephone Encounter (Signed)
Spoke with patient and he reports yesterday some neck pain then discomfort in his chest which lasted half an hour. Today a hour ago on bike ride he had repeat discomfort. This lasted about 10 minutes the longest. Any exertion is causing him discomfort.  He stated that when he saw Dr. Rockey Situ last time he had similar discomfort and "they did not find anything". Expressed importance that if this persists or worsens he should go to ED for evaluation. He verbalized understanding of our conversation, agreement with plan, and had no further questions at this time. He has been scheduled to come in next week to see provider.

## 2021-09-12 NOTE — Telephone Encounter (Signed)
Patient calling.   Patient stated that since yesterday he has been having some chest tightness and some sharp pains in his chest.   Patient requested an appt -- scheduled him with the first available - 09/19/2021 with gollan.
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
## 2021-09-19 ENCOUNTER — Ambulatory Visit: Payer: Medicare PPO | Admitting: Cardiovascular Disease

## 2021-09-19 ENCOUNTER — Encounter: Payer: Self-pay | Admitting: Cardiovascular Disease

## 2021-09-19 ENCOUNTER — Other Ambulatory Visit: Payer: Self-pay

## 2021-09-19 VITALS — BP 134/70 | HR 64 | Ht 69.0 in | Wt 168.2 lb

## 2021-09-19 DIAGNOSIS — R079 Chest pain, unspecified: Secondary | ICD-10-CM

## 2021-09-19 DIAGNOSIS — I6501 Occlusion and stenosis of right vertebral artery: Secondary | ICD-10-CM

## 2021-09-19 DIAGNOSIS — R55 Syncope and collapse: Secondary | ICD-10-CM

## 2021-09-19 DIAGNOSIS — I739 Peripheral vascular disease, unspecified: Secondary | ICD-10-CM

## 2021-09-19 DIAGNOSIS — I10 Essential (primary) hypertension: Secondary | ICD-10-CM | POA: Diagnosis not present

## 2021-09-19 DIAGNOSIS — G458 Other transient cerebral ischemic attacks and related syndromes: Secondary | ICD-10-CM

## 2021-09-19 DIAGNOSIS — I499 Cardiac arrhythmia, unspecified: Secondary | ICD-10-CM

## 2021-09-19 DIAGNOSIS — I493 Ventricular premature depolarization: Secondary | ICD-10-CM

## 2021-09-19 NOTE — Patient Instructions (Signed)
For more chest pain sx concerning for heart, Call the office We could order the cardiac CTA (with contrast)   Medication Instructions:  No changes  If you need a refill on your cardiac medications before your next appointment, please call your pharmacy.   Lab work: No new labs needed  Testing/Procedures: No new testing needed  Follow-Up: At [facility] you and your health needs are our priority.  As part of our continuing mission to provide you with exceptional heart care, we have created designated Provider Care Teams.  These Care Teams include your primary Cardiologist (physician) and Advanced Practice Providers (APPs -  Physician Assistants and Nurse Practitioners) who all work together to provide you with the care you need, when you need it.  You will need a follow up appointment in 12 months  Providers on your designated Care Team:   Murray Hodgkins, NP Christell Faith, PA-C Cadence Kathlen Mody, [place]  COVID-19 Vaccine Information can be found at: [email or web address] For questions related to vaccine distribution or appointments, please email [email or web address] or call [phone].

## 2021-10-10 ENCOUNTER — Other Ambulatory Visit: Payer: Self-pay | Admitting: Cardiovascular Disease

## 2021-10-12 ENCOUNTER — Other Ambulatory Visit: Payer: Self-pay

## 2021-10-12 ENCOUNTER — Ambulatory Visit: Payer: Medicare PPO | Admitting: Urology

## 2021-10-12 ENCOUNTER — Encounter: Payer: Self-pay | Admitting: Urology

## 2021-10-12 VITALS — BP 151/82 | HR 66 | Ht 70.0 in | Wt 162.0 lb

## 2021-10-12 DIAGNOSIS — N401 Enlarged prostate with lower urinary tract symptoms: Secondary | ICD-10-CM

## 2021-10-12 MED ORDER — FINASTERIDE 5 MG PO TABS: 5.0000 mg | ORAL_TABLET | Freq: Every day | ORAL | 3 refills | Status: DC

## 2021-10-12 NOTE — Progress Notes (Signed)
10/12/2021 9:21 AM   [name] 1934/08/06 [id number]  Referring provider: [name], MD [phone] [place],  [place] [address]  Chief Complaint  Patient presents with   Benign Prostatic Hypertrophy    Urologic history: 1.  BPH with LUTS -Finasteride; tamsulosin discontinued 10/2019 due to orthostatic symptoms -Cystoscopy 02/2018 elevated bladder neck, trilobar enlargement  HPI: 88 y.o. male presents for annual follow-up.  Doing well since last visit No bothersome LUTS Remains on finasteride Denies dysuria, gross hematuria Denies flank, abdominal or pelvic pain   PMH: 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, 12/26/10, 06/21/08, 04/20/06, 04/11/03, 01/05/03   Cancer ([place])    melanoma   Cardiac arrhythmia    Cataract    Chicken pox    Colon polyp 12/29/10, 04/18/06, 01/05/03   Diplopia    Dysrhythmia    Essential hypertension    Exotropia, left eye    GERD (gastroesophageal reflux disease)    Gout    H/O: CVA (cerebrovascular accident)    Hemorrhoid    Hepatic cyst    History of kidney stones    Low testosterone    Melanoma in situ ([place])    left ear, right cheek   Migraine    Near syncope    Nephrolithiasis    Nephrolithiasis    Occlusion and stenosis of vertebral artery    Psoriasis    Reflux    Splenomegaly    Strabismus    Subclavian steal syndrome    Syncope and collapse    Thrombocytopenia (HCC)    Ventricular premature depolarization    Vertebral artery stenosis     Surgical History: Past Surgical History:  Procedure Laterality Date   APPENDECTOMY     COLONOSCOPY     COLONOSCOPY WITH PROPOFOL N/A 03/13/2016   Procedure: COLONOSCOPY WITH PROPOFOL;  Surgeon: Lollie Sails, MD;  Location: [facility] ENDOSCOPY;  Service: Endoscopy;  Laterality: N/A;   diplopia     ESOPHAGOGASTRODUODENOSCOPY  08/2014, 06/14/2012   ESOPHAGOGASTRODUODENOSCOPY (EGD)  WITH PROPOFOL N/A 03/13/2016   Procedure: ESOPHAGOGASTRODUODENOSCOPY (EGD) WITH PROPOFOL;  Surgeon: Lollie Sails, MD;  Location: [facility] ENDOSCOPY;  Service: Endoscopy;  Laterality: N/A;   ESOPHAGOGASTRODUODENOSCOPY (EGD) WITH PROPOFOL N/A 07/15/2018   Procedure: ESOPHAGOGASTRODUODENOSCOPY (EGD) WITH PROPOFOL;  Surgeon: Lollie Sails, MD;  Location: [facility] ENDOSCOPY;  Service: Endoscopy;  Laterality: N/A;   EYE SURGERY  08/16/2003   eye sug   MOHS SURGERY  2011, 2012   left ear and right cheek   STRABISMUS SURGERY  10/20/2015   2 horizontal muscles-left   TONSILLECTOMY     WISDOM TOOTH EXTRACTION      Home Medications:  Allergies as of 10/12/2021       Reactions   Other Diarrhea   Other reaction(s): Abdominal Pain tarragon Other reaction(s): Abdominal Pain tarragon        Medication List        Accurate as of October 12, 2021  9:21 AM. If you have any questions, ask your nurse or doctor.          STOP taking these medications    albuterol 108 (90 Base) MCG/ACT inhaler Commonly known as: VENTOLIN HFA Stopped by: Abbie Sons, MD   benzonatate 100 MG capsule Commonly known as: Best boy Stopped by: Abbie Sons, MD   menthol-cetylpyridinium 3 MG lozenge Commonly known as: CEPACOL Stopped by: Nicki Reaper  Mariana Arn, MD   predniSONE 10 MG tablet Commonly known as: DELTASONE Stopped by: Abbie Sons, MD       TAKE these medications    calcium-vitamin D 500-200 MG-UNIT tablet Commonly known as: OSCAL WITH D Take 1 tablet by mouth daily with breakfast.   clobetasol ointment 0.05 % Commonly known as: TEMOVATE Apply topically.   desonide 0.05 % cream Commonly known as: DESOWEN Apply 1 application topically daily as needed.   ezetimibe 10 MG tablet Commonly known as: ZETIA TAKE ONE TABLET BY MOUTH EVERY DAY   finasteride 5 MG tablet Commonly known as: PROSCAR Take 1 tablet (5 mg total) by mouth daily.   folic acid 1 MG tablet Commonly  known as: FOLVITE Take 1 tablet by mouth. 6 times a week   ketoconazole 2 % shampoo Commonly known as: NIZORAL Apply topically 2 (two) times a week.   Multi-Vitamins Tabs Take by mouth daily.   omeprazole 20 MG capsule Commonly known as: PRILOSEC Take 20 mg by mouth daily.   tacrolimus 0.1 % ointment Commonly known as: PROTOPIC   Testosterone 20.25 MG/ACT (1.62%) Gel Apply to shoulders and/or upper arms only 5 grams   valACYclovir 1000 MG tablet Commonly known as: VALTREX Take 500 mg by mouth daily.        Allergies:  Allergies  Allergen Reactions   Other Diarrhea    Other reaction(s): Abdominal Pain tarragon Other reaction(s): Abdominal Pain tarragon    Family History: Family History  Problem Relation Age of Onset   Heart failure Mother    Osteoporosis Mother    Stroke Mother    Heart attack Father    Gout Father    Diabetes Mellitus II Father    Renal Disease Father    Alcohol abuse Father    Osteoporosis Maternal Grandfather    Osteoporosis Maternal Grandmother     Social History:  reports that he quit smoking about 60 years ago. His smoking use included cigarettes. He has a 2.50 pack-year smoking history. He has never used smokeless tobacco. He reports current alcohol use. He reports that he does not use drugs.   Physical Exam: BP (!) 151/82    Pulse 66    Ht 5\' 10"  (1.778 m)    Wt 162 lb (73.5 kg)    BMI 23.24 kg/m   Constitutional:  Alert and oriented, No acute distress. HEENT: [place] AT, moist mucus membranes.  Trachea midline, no masses. Cardiovascular: No clubbing, cyanosis, or edema. Respiratory: Normal respiratory effort, no increased work of breathing. Psychiatric: Normal mood and affect.   Assessment & Plan:    1.  BPH with LUTS Stable Finasteride refilled  Continue annual follow-up   Abbie Sons, MD  [facility] [address] [place], [place] [address] [phone]

## 2021-10-15 ENCOUNTER — Encounter: Payer: Self-pay | Admitting: Urology

## 2021-12-13 ENCOUNTER — Inpatient Hospital Stay: Payer: Medicare PPO | Attending: Internal Medicine

## 2021-12-13 ENCOUNTER — Inpatient Hospital Stay: Payer: Medicare PPO | Admitting: Medical Oncology

## 2021-12-13 VITALS — BP 146/68 | HR 72 | Temp 97.6°F | Resp 16 | Ht 70.0 in | Wt 165.7 lb

## 2021-12-13 DIAGNOSIS — Z87891 Personal history of nicotine dependence: Secondary | ICD-10-CM | POA: Insufficient documentation

## 2021-12-13 DIAGNOSIS — Z79899 Other long term (current) drug therapy: Secondary | ICD-10-CM | POA: Diagnosis not present

## 2021-12-13 DIAGNOSIS — M549 Dorsalgia, unspecified: Secondary | ICD-10-CM | POA: Diagnosis not present

## 2021-12-13 DIAGNOSIS — D696 Thrombocytopenia, unspecified: Secondary | ICD-10-CM | POA: Diagnosis present

## 2021-12-13 DIAGNOSIS — G8929 Other chronic pain: Secondary | ICD-10-CM | POA: Insufficient documentation

## 2021-12-13 LAB — CBC WITH DIFFERENTIAL/PLATELET
Abs Immature Granulocytes: 0.02 10*3/uL (ref 0.00–0.07)
Basophils Absolute: 0 10*3/uL (ref 0.0–0.1)
Basophils Relative: 0 %
Eosinophils Absolute: 0.1 10*3/uL (ref 0.0–0.5)
Eosinophils Relative: 3 %
HCT: 48.8 % (ref 39.0–52.0)
Hemoglobin: 16.6 g/dL (ref 13.0–17.0)
Immature Granulocytes: 0 %
Lymphocytes Relative: 32 %
Lymphs Abs: 1.5 10*3/uL (ref 0.7–4.0)
MCH: 31.7 pg (ref 26.0–34.0)
MCHC: 34 g/dL (ref 30.0–36.0)
MCV: 93.3 fL (ref 80.0–100.0)
Monocytes Absolute: 0.4 10*3/uL (ref 0.1–1.0)
Monocytes Relative: 9 %
Neutro Abs: 2.6 10*3/uL (ref 1.7–7.7)
Neutrophils Relative %: 56 %
Platelets: 97 10*3/uL — ABNORMAL LOW (ref 150–400)
RBC: 5.23 MIL/uL (ref 4.22–5.81)
RDW: 12.8 % (ref 11.5–15.5)
WBC: 4.7 10*3/uL (ref 4.0–10.5)
nRBC: 0 % (ref 0.0–0.2)

## 2021-12-13 LAB — COMPREHENSIVE METABOLIC PANEL
ALT: 21 U/L (ref 0–44)
AST: 28 U/L (ref 15–41)
Albumin: 4.4 g/dL (ref 3.5–5.0)
Alkaline Phosphatase: 53 U/L (ref 38–126)
Anion gap: 8 (ref 5–15)
BUN: 25 mg/dL — ABNORMAL HIGH (ref 8–23)
CO2: 28 mmol/L (ref 22–32)
Calcium: 8.8 mg/dL — ABNORMAL LOW (ref 8.9–10.3)
Chloride: 102 mmol/L (ref 98–111)
Creatinine, Ser: 0.97 mg/dL (ref 0.61–1.24)
GFR, Estimated: >60 mL/min (ref >60)
Glucose, Bld: 124 mg/dL — ABNORMAL HIGH (ref 70–99)
Potassium: 3.8 mmol/L (ref 3.5–5.1)
Sodium: 138 mmol/L (ref 135–145)
Total Bilirubin: 1.4 mg/dL — ABNORMAL HIGH (ref 0.3–1.2)
Total Protein: 6.9 g/dL (ref 6.5–8.1)

## 2021-12-13 LAB — LACTATE DEHYDROGENASE: LDH: 133 U/L (ref 98–192)

## 2021-12-13 NOTE — Addendum Note (Signed)
Addended by: Wilford Corner on: 12/13/2021 02:00 PM ? ? Modules accepted: Orders ? ?

## 2021-12-13 NOTE — Progress Notes (Signed)
[facility] ?CONSULT NOTE ? ?Patient Care Team: ?[name], MD as PCP - General (Family Medicine) ?Minna Merritts, MD as Consulting Physician (Cardiology) ? ?CHIEF COMPLAINTS/PURPOSE OF CONSULTATION:  ? ?# 2013- CHRONIC ISOLATED THROMBOCYTOPENIA- 329-51; May 2017- 115 [place]- 2015- MILD splenomegaly; liver Elastography- Dr.Skulskie-N/ no cirrhosis; hepatitis B/C- NEG; Monoclonal work up-NEG ? 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 ?HISTORY OF PRESENTING ILLNESS:  ?LANGLEY INGALLS 88 y.o.  male here for follow-up because of his thrombocytopenia.  ? ?Patient reports that overall he is doing well.  Has chronic back pain for which she is followed by orthopedics but this is unchanged.  He denies any bleeding episodes, stool changes, melena stools or constipation.  No other pains. ? ?He continues to be physically active.   ? ?Review of Systems  ?Constitutional:  Negative for chills, diaphoresis, fever, malaise/fatigue and weight loss.  ?HENT:  Negative for nosebleeds and sore throat.   ?Eyes:  Negative for double vision.  ?Respiratory:  Negative for cough, hemoptysis, sputum production, shortness of breath and wheezing.   ?Cardiovascular:  Negative for chest pain, palpitations, orthopnea and leg swelling.  ?Gastrointestinal:  Negative for abdominal pain, blood in stool, constipation, diarrhea, heartburn, melena, nausea and vomiting.  ?Genitourinary:  Negative for dysuria, frequency and urgency.  ?Musculoskeletal:  Negative for back pain and joint pain.  ?Skin: Negative.  Negative for itching and rash.  ?Neurological:  Negative for dizziness, tingling, focal weakness, weakness and headaches.  ?Endo/Heme/Allergies:  Does not bruise/bleed easily.  ?Psychiatric/Behavioral:  Negative for depression. The patient is not nervous/anxious and does not have insomnia.   ? ? ?MEDICAL HISTORY:  ?Past Medical History:  ?Diagnosis Date  ? Adenomatous polyp 2012  ? Allergic  rhinitis   ? Arrhythmia   ? Barrett esophagus 08/12/12, 12/26/10, 06/21/08, 04/20/06, 04/11/03, 01/05/03  ? Cancer [facility])   ? melanoma  ? Cardiac arrhythmia   ? Cataract   ? Chicken pox   ? Colon polyp 12/29/10, 04/18/06, 01/05/03  ? Diplopia   ? Dysrhythmia   ? Essential hypertension   ? Exotropia, left eye   ? GERD (gastroesophageal reflux disease)   ? Gout   ? H/O: CVA (cerebrovascular accident)   ? Hemorrhoid   ? Hepatic cyst   ? History of kidney stones   ? Low testosterone   ? Melanoma in situ [facility])   ? left ear, right cheek  ? Migraine   ? Near syncope   ? Nephrolithiasis   ? Nephrolithiasis   ? Occlusion and stenosis of vertebral artery   ? Psoriasis   ? Reflux   ? Splenomegaly   ? Strabismus   ? Subclavian steal syndrome   ? Syncope and collapse   ? Thrombocytopenia ([place])   ? Ventricular premature depolarization   ? Vertebral artery stenosis   ? ? ?SURGICAL HISTORY: ?Past Surgical History:  ?Procedure Laterality Date  ? APPENDECTOMY    ? COLONOSCOPY    ? COLONOSCOPY WITH PROPOFOL N/A 03/13/2016  ? Procedure: COLONOSCOPY WITH PROPOFOL;  Surgeon: Lollie Sails, MD;  Location: [facility] ENDOSCOPY;  Service: Endoscopy;  Laterality: N/A;  ? diplopia    ? ESOPHAGOGASTRODUODENOSCOPY  08/2014, 06/14/2012  ? ESOPHAGOGASTRODUODENOSCOPY (EGD) WITH PROPOFOL N/A 03/13/2016  ? Procedure: ESOPHAGOGASTRODUODENOSCOPY (EGD) WITH PROPOFOL;  Surgeon: Lollie Sails, MD;  Location: [facility] ENDOSCOPY;  Service: Endoscopy;  Laterality: N/A;  ? ESOPHAGOGASTRODUODENOSCOPY (EGD) WITH PROPOFOL N/A 07/15/2018  ? Procedure: ESOPHAGOGASTRODUODENOSCOPY (EGD) WITH PROPOFOL;  Surgeon: Loistine Simas  U, MD;  Location: [facility] ENDOSCOPY;  Service: Endoscopy;  Laterality: N/A;  ? EYE SURGERY  08/16/2003  ? eye sug  ? MOHS SURGERY  2011, 2012  ? left ear and right cheek  ? STRABISMUS SURGERY  10/20/2015  ? 2 horizontal muscles-left  ? TONSILLECTOMY    ? WISDOM TOOTH EXTRACTION    ? ? ?SOCIAL HISTORY: ?Social History  ? ?Socioeconomic History  ? Marital  status: Married  ?  Spouse name: Not on file  ? Number of children: Not on file  ? Years of education: Not on file  ? Highest education level: Not on file  ?Occupational History  ? Not on file  ?Tobacco Use  ? Smoking status: Former  ?  Packs/day: 0.25  ?  Years: 10.00  ?  Pack years: 2.50  ?  Types: Cigarettes  ?  Quit date: 01/30/1961  ?  Years since quitting: 60.9  ? Smokeless tobacco: Never  ?Vaping Use  ? Vaping Use: Never used  ?Substance and Sexual Activity  ? Alcohol use: Yes  ?  Alcohol/week: 0.0 standard drinks  ?  Comment: social drinker  ? Drug use: No  ? Sexual activity: Yes  ?Other Topics Concern  ? Not on file  ?Social History Narrative  ?  Lives in [place]; no smoking; 4-5 cocktails a week; retired from Radiation protection practitioner; patient is a avid cyclist.  ? ?Social Determinants of Health  ? ?Financial Resource Strain: Not on file  ?Food Insecurity: Not on file  ?Transportation Needs: Not on file  ?Physical Activity: Not on file  ?Stress: Not on file  ?Social Connections: Not on file  ?Intimate Partner Violence: Not on file  ? ? ?FAMILY HISTORY: ?Family History  ?Problem Relation Age of Onset  ? Heart failure Mother   ? Osteoporosis Mother   ? Stroke Mother   ? Heart attack Father   ? Gout Father   ? Diabetes Mellitus II Father   ? Renal Disease Father   ? Alcohol abuse Father   ? Osteoporosis Maternal Grandfather   ? Osteoporosis Maternal Grandmother   ? ? ?ALLERGIES:  is allergic to other. ? ?MEDICATIONS:  ?Current Outpatient Medications  ?Medication Sig Dispense Refill  ? calcium-vitamin D (OSCAL WITH D) 500-200 MG-UNIT tablet Take 1 tablet by mouth daily with breakfast.    ? clobetasol ointment (TEMOVATE) 0.05 % Apply topically.    ? ezetimibe (ZETIA) 10 MG tablet TAKE ONE TABLET BY MOUTH EVERY DAY 90 tablet 3  ? finasteride (PROSCAR) 5 MG tablet Take 1 tablet (5 mg total) by mouth daily. 90 tablet 3  ? folic acid (FOLVITE) 1 MG tablet Take 1 tablet by mouth. 6 times a week    ? ketoconazole (NIZORAL) 2  % shampoo Apply topically 2 (two) times a week.    ? Multiple Vitamin (MULTI-VITAMINS) TABS Take by mouth daily.     ? omeprazole (PRILOSEC) 20 MG capsule Take 20 mg by mouth daily.    ? tacrolimus (PROTOPIC) 0.1 % ointment     ? Testosterone 20.25 MG/ACT (1.62%) GEL Apply to shoulders and/or upper arms only 5 grams    ? valACYclovir (VALTREX) 1000 MG tablet Take 500 mg by mouth daily.     ? desonide (DESOWEN) 0.05 % cream Apply 1 application topically daily as needed. (Patient not taking: Reported on 12/13/2021)    ? ?No current facility-administered medications for this visit.  ? ? ?  ?. ? ?PHYSICAL EXAMINATION: ? ? ?Vitals:  ?  12/13/21 1312  ?BP: (!) 146/68  ?Pulse: 72  ?Resp: 16  ?Temp: 97.6 ?F (36.4 ?C)  ?SpO2: 100%  ? ?Filed Weights  ? 12/13/21 1312  ?Weight: 165 lb 11.2 oz (75.2 kg)  ? ? ?Physical Exam ?HENT:  ?   Head: Normocephalic and atraumatic.  ?   Mouth/Throat:  ?   Pharynx: No oropharyngeal exudate.  ?Eyes:  ?   Pupils: Pupils are equal, round, and reactive to light.  ?Cardiovascular:  ?   Rate and Rhythm: Normal rate and regular rhythm.  ?Pulmonary:  ?   Effort: No respiratory distress.  ?   Breath sounds: No wheezing.  ?Abdominal:  ?   General: Bowel sounds are normal. There is no distension.  ?   Palpations: Abdomen is soft. There is no mass.  ?   Tenderness: There is no abdominal tenderness. There is no guarding or rebound.  ?Musculoskeletal:     ?   General: No tenderness. Normal range of motion.  ?   Cervical back: Normal range of motion and neck supple.  ?Skin: ?   General: Skin is warm.  ?Neurological:  ?   Mental Status: He is alert and oriented to person, place, and time.  ?Psychiatric:     ?   Mood and Affect: Affect normal.  ? ? ? ?LABORATORY DATA:  ?I have reviewed the data as listed ?Lab Results  ?Component Value Date  ? WBC 4.7 12/13/2021  ? HGB 16.6 12/13/2021  ? HCT 48.8 12/13/2021  ? MCV 93.3 12/13/2021  ? PLT 97 (L) 12/13/2021  ? ?Recent Labs  ?  04/11/21 ?0659 04/12/21 ?0618  12/13/21 ?1259  ?NA 142 142 138  ?K 4.5 3.9 3.8  ?CL 106 104 102  ?CO2 '28 30 28  '$ ?GLUCOSE 165* 110* 124*  ?BUN 44* 35* 25*  ?CREATININE 1.23 1.22 0.97  ?CALCIUM 8.6* 8.5* 8.8*  ?GFRNONAA 58* 58* >60  ?PROT 5.9*

## 2022-06-20 ENCOUNTER — Encounter: Payer: Self-pay | Admitting: Internal Medicine

## 2022-06-20 ENCOUNTER — Inpatient Hospital Stay (HOSPITAL_BASED_OUTPATIENT_CLINIC_OR_DEPARTMENT_OTHER): Payer: Medicare PPO | Admitting: Internal Medicine

## 2022-06-20 ENCOUNTER — Inpatient Hospital Stay: Payer: Medicare PPO | Attending: Internal Medicine

## 2022-06-20 DIAGNOSIS — G8929 Other chronic pain: Secondary | ICD-10-CM | POA: Diagnosis not present

## 2022-06-20 DIAGNOSIS — Z87891 Personal history of nicotine dependence: Secondary | ICD-10-CM | POA: Insufficient documentation

## 2022-06-20 DIAGNOSIS — Z79899 Other long term (current) drug therapy: Secondary | ICD-10-CM | POA: Insufficient documentation

## 2022-06-20 DIAGNOSIS — Z79624 Long term (current) use of inhibitors of nucleotide synthesis: Secondary | ICD-10-CM | POA: Insufficient documentation

## 2022-06-20 DIAGNOSIS — D696 Thrombocytopenia, unspecified: Secondary | ICD-10-CM

## 2022-06-20 DIAGNOSIS — M549 Dorsalgia, unspecified: Secondary | ICD-10-CM | POA: Insufficient documentation

## 2022-06-20 LAB — CBC WITH DIFFERENTIAL/PLATELET
Abs Immature Granulocytes: 0.02 10*3/uL (ref 0.00–0.07)
Basophils Absolute: 0 10*3/uL (ref 0.0–0.1)
Basophils Relative: 0 %
Eosinophils Absolute: 0.2 10*3/uL (ref 0.0–0.5)
Eosinophils Relative: 3 %
HCT: 50.3 % (ref 39.0–52.0)
Hemoglobin: 17.2 g/dL — ABNORMAL HIGH (ref 13.0–17.0)
Immature Granulocytes: 0 %
Lymphocytes Relative: 37 %
Lymphs Abs: 2.1 10*3/uL (ref 0.7–4.0)
MCH: 33.2 pg (ref 26.0–34.0)
MCHC: 34.2 g/dL (ref 30.0–36.0)
MCV: 97.1 fL (ref 80.0–100.0)
Monocytes Absolute: 0.4 10*3/uL (ref 0.1–1.0)
Monocytes Relative: 7 %
Neutro Abs: 3.1 10*3/uL (ref 1.7–7.7)
Neutrophils Relative %: 53 %
Platelets: 95 10*3/uL — ABNORMAL LOW (ref 150–400)
RBC: 5.18 MIL/uL (ref 4.22–5.81)
RDW: 13.2 % (ref 11.5–15.5)
WBC: 5.8 10*3/uL (ref 4.0–10.5)
nRBC: 0 % (ref 0.0–0.2)

## 2022-06-20 LAB — COMPREHENSIVE METABOLIC PANEL
ALT: 25 U/L (ref 0–44)
AST: 29 U/L (ref 15–41)
Albumin: 4.7 g/dL (ref 3.5–5.0)
Alkaline Phosphatase: 58 U/L (ref 38–126)
Anion gap: 7 (ref 5–15)
BUN: 26 mg/dL — ABNORMAL HIGH (ref 8–23)
CO2: 28 mmol/L (ref 22–32)
Calcium: 9 mg/dL (ref 8.9–10.3)
Chloride: 103 mmol/L (ref 98–111)
Creatinine, Ser: 1.21 mg/dL (ref 0.61–1.24)
GFR, Estimated: 58 mL/min — ABNORMAL LOW (ref >60)
Glucose, Bld: 112 mg/dL — ABNORMAL HIGH (ref 70–99)
Potassium: 4.4 mmol/L (ref 3.5–5.1)
Sodium: 138 mmol/L (ref 135–145)
Total Bilirubin: 1.2 mg/dL (ref 0.3–1.2)
Total Protein: 7.3 g/dL (ref 6.5–8.1)

## 2022-06-20 LAB — LACTATE DEHYDROGENASE: LDH: 161 U/L (ref 98–192)

## 2022-06-20 NOTE — Assessment & Plan Note (Addendum)
#  Chronic thrombocytopenia isolated- > 100 [since 2015]-platelets today 95.  Clinically suspicious of ITP [although mild splenomegaly]; less likely from alcohol/MDS.  No bone marrow biopsy.  # Psoriasis- on MXT- STABLE.   # As patient is currently asymptomatic.  Continue monitoring; patient has appointment with PCP in 6 months.  # DISPOSITION:  # follow up in 12 months- MD/labs-cbc/cmp/ldh- Dr.B  Cc; Dr.Bronstein

## 2022-06-20 NOTE — Progress Notes (Signed)
Patient here for follow up. Patient denies any concerns at this time. 

## 2022-06-20 NOTE — Progress Notes (Signed)
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  Alone.  [name] 88 y.o.  male here for follow-up because of his thrombocytopenia.   Patient here for follow up. Patient denies any concerns at this time.   Patient reports that overall he is doing well.  Has chronic back pain for which she is followed by orthopedics but this is unchanged.  He denies any bleeding episodes, stool changes, melena stools or constipation.  No other pains.  He continues to be physically active.    Review of Systems  Constitutional:  Negative for chills, diaphoresis, fever, malaise/fatigue and weight loss.  HENT:  Negative for nosebleeds and sore throat.   Eyes:  Negative for double vision.  Respiratory:  Negative for cough, hemoptysis, sputum production, shortness of breath and wheezing.   Cardiovascular:  Negative for chest pain, palpitations, orthopnea and leg swelling.  Gastrointestinal:  Negative for abdominal pain, blood in stool, constipation, diarrhea, heartburn, melena, nausea and vomiting.  Genitourinary:  Negative for dysuria, frequency and urgency.  Musculoskeletal:  Negative for back pain and joint pain.  Skin: Negative.  Negative for itching and rash.  Neurological:  Negative for dizziness, tingling, focal weakness, weakness and headaches.  Endo/Heme/Allergies:  Does not bruise/bleed easily.  Psychiatric/Behavioral:  Negative for depression. The patient is not nervous/anxious and does  not have insomnia.      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, 12/26/10, 06/21/08, 04/20/06, 04/11/03, 01/05/03   Cancer ([place])    melanoma   Cardiac arrhythmia    Cataract    Chicken pox    Colon polyp 12/29/10, 04/18/06, 01/05/03   Diplopia    Dysrhythmia    Essential hypertension    Exotropia, left eye    GERD (gastroesophageal reflux disease)    Gout    H/O: CVA (cerebrovascular accident)    Hemorrhoid    Hepatic cyst    History of kidney stones    Low testosterone    Melanoma in situ ([place])    left ear, right cheek   Migraine    Near syncope    Nephrolithiasis    Nephrolithiasis    Occlusion and stenosis of vertebral artery    Psoriasis    Reflux    Splenomegaly    Strabismus    Subclavian steal syndrome    Syncope and collapse    Thrombocytopenia (HCC)    Ventricular premature depolarization    Vertebral artery stenosis     SURGICAL HISTORY: Past Surgical History:  Procedure Laterality Date   APPENDECTOMY     COLONOSCOPY     COLONOSCOPY WITH PROPOFOL N/A 03/13/2016   Procedure: COLONOSCOPY WITH PROPOFOL;  Surgeon: Lollie Sails, MD;  [facility] ENDOSCOPY;  Service: Endoscopy;  Laterality: N/A;   diplopia     ESOPHAGOGASTRODUODENOSCOPY  08/2014, 06/14/2012   ESOPHAGOGASTRODUODENOSCOPY (EGD) WITH PROPOFOL N/A 03/13/2016   Procedure: ESOPHAGOGASTRODUODENOSCOPY (EGD) WITH PROPOFOL;  Surgeon: Lollie Sails, MD;  Location: [facility] ENDOSCOPY;  Service: Endoscopy;  Laterality: N/A;  ESOPHAGOGASTRODUODENOSCOPY (EGD) WITH PROPOFOL N/A 07/15/2018   Procedure: ESOPHAGOGASTRODUODENOSCOPY (EGD) WITH PROPOFOL;  Surgeon: Lollie Sails, MD;  Location: [facility] ENDOSCOPY;  Service: Endoscopy;  Laterality: N/A;   EYE SURGERY  08/16/2003   eye sug   MOHS SURGERY  2011, 2012   left ear and right cheek   STRABISMUS SURGERY  10/20/2015   2 horizontal muscles-left    TONSILLECTOMY     WISDOM TOOTH EXTRACTION      SOCIAL HISTORY: Social History   Socioeconomic History   Marital status: Married    Spouse name: Not on file   Number of children: Not on file   Years of education: Not on file   Highest education level: Not on file  Occupational History   Not on file  Tobacco Use   Smoking status: Former    Packs/day: 0.25    Years: 10.00    Total pack years: 2.50    Types: Cigarettes    Quit date: 01/30/1961    Years since quitting: 61.4   Smokeless tobacco: Never  Vaping Use   Vaping Use: Never used  Substance and Sexual Activity   Alcohol use: Yes    Alcohol/week: 0.0 standard drinks of alcohol    Comment: social drinker   Drug use: No   Sexual activity: Yes  Other Topics Concern   Not on file  Social History Narrative    Lives in [place]; no smoking; 4-5 cocktails a week; retired from Radiation protection practitioner; patient is a avid Wellsite geologist.   Social Determinants of Radio broadcast assistant Strain: Not on file  Food Insecurity: Not on file  Transportation Needs: Not on file  Physical Activity: Not on file  Stress: Not on file  Social Connections: Not on file  Intimate Partner Violence: Not on file    FAMILY HISTORY: Family History  Problem Relation Age of Onset   Heart failure Mother    Osteoporosis Mother    Stroke Mother    Heart attack Father    Gout Father    Diabetes Mellitus II Father    Renal Disease Father    Alcohol abuse Father    Osteoporosis Maternal Grandfather    Osteoporosis Maternal Grandmother     ALLERGIES:  is allergic to other.  MEDICATIONS:  Current Outpatient Medications  Medication Sig Dispense Refill   calcium-vitamin D (OSCAL WITH D) 500-200 MG-UNIT tablet Take 1 tablet by mouth daily with breakfast.     clobetasol ointment (TEMOVATE) 0.05 % Apply topically.     ezetimibe (ZETIA) 10 MG tablet TAKE ONE TABLET BY MOUTH EVERY DAY 90 tablet 3   finasteride (PROSCAR) 5 MG tablet Take 1 tablet (5 mg  total) by mouth daily. 90 tablet 3   folic acid (FOLVITE) 1 MG tablet Take 1 tablet by mouth. 6 times a week     ketoconazole (NIZORAL) 2 % shampoo Apply topically 2 (two) times a week.     methotrexate (RHEUMATREX) 2.5 MG tablet 2.5 mg 5 tablets once a week     Multiple Vitamin (MULTI-VITAMINS) TABS Take by mouth daily.      omeprazole (PRILOSEC) 20 MG capsule Take 20 mg by mouth daily.     tacrolimus (PROTOPIC) 0.1 % ointment      Testosterone 20.25 MG/ACT (1.62%) GEL Apply to shoulders and/or upper arms only 5 grams     valACYclovir (VALTREX) 1000 MG tablet Take 500 mg by mouth daily.      desonide (DESOWEN) 0.05 % cream Apply 1  application topically daily as needed. (Patient not taking: Reported on 12/13/2021)     No current facility-administered medications for this visit.      Marland Kitchen  PHYSICAL EXAMINATION:   Vitals:   06/20/22 1403  BP: 131/78  Pulse: 68  Temp: (!) 96.9 F (36.1 C)   Filed Weights   06/20/22 1403  Weight: 166 lb (75.3 kg)    Physical Exam HENT:     Head: Normocephalic and atraumatic.     Mouth/Throat:     Pharynx: No oropharyngeal exudate.  Eyes:     Pupils: Pupils are equal, round, and reactive to light.  Cardiovascular:     Rate and Rhythm: Normal rate and regular rhythm.  Pulmonary:     Effort: No respiratory distress.     Breath sounds: No wheezing.  Abdominal:     General: Bowel sounds are normal. There is no distension.     Palpations: Abdomen is soft. There is no mass.     Tenderness: There is no abdominal tenderness. There is no guarding or rebound.  Musculoskeletal:        General: No tenderness. Normal range of motion.     Cervical back: Normal range of motion and neck supple.  Skin:    General: Skin is warm.  Neurological:     Mental Status: He is alert and oriented to person, place, and time.  Psychiatric:        Mood and Affect: Affect normal.      LABORATORY DATA:  I have reviewed the data as listed Lab Results  Component  Value Date   WBC 5.8 06/20/2022   HGB 17.2 (H) 06/20/2022   HCT 50.3 06/20/2022   MCV 97.1 06/20/2022   PLT 95 (L) 06/20/2022   Recent Labs    12/13/21 1259 06/20/22 1322  NA 138 138  K 3.8 4.4  CL 102 103  CO2 28 28  GLUCOSE 124* 112*  BUN 25* 26*  CREATININE 0.97 1.21  CALCIUM 8.8* 9.0  GFRNONAA >60 58*  PROT 6.9 7.3  ALBUMIN 4.4 4.7  AST 28 29  ALT 21 25  ALKPHOS 53 58  BILITOT 1.4* 1.2     ASSESSMENT & PLAN:   Thrombocytopenia (HCC) # Chronic thrombocytopenia isolated- > 100 [since 2015]-platelets today 95.  Clinically suspicious of ITP [although mild splenomegaly]; less likely from alcohol/MDS.  No bone marrow biopsy.  # Psoriasis- on MXT- STABLE.   # As patient is currently asymptomatic.  Continue monitoring; patient has appointment with PCP in 6 months.  # DISPOSITION:  # follow up in 12 months- MD/labs-cbc/cmp/ldh- Dr.B  Cc; Dr.Bronstein  Encounter Diagnosis  Name Primary?   Thrombocytopenia (HCC)    Chronic and stable.  Reviewed patient's labs with him today and his labs from his previous visit.  Everything is stable and nonconcerning at this time.  We will plan for 20-monthfollow-up with labs.  PLAN: Return in 6 months with Dr. B with labs (CBC, CMP, LDH)   GCammie Sickle MD 06/20/2022 2:41 PM

## 2022-07-09 ENCOUNTER — Ambulatory Visit: Payer: Medicare PPO | Admitting: Physician Assistant

## 2022-07-09 ENCOUNTER — Encounter: Payer: Self-pay | Admitting: Physician Assistant

## 2022-07-09 VITALS — BP 154/75 | HR 62 | Ht 70.0 in | Wt 166.0 lb

## 2022-07-09 DIAGNOSIS — N401 Enlarged prostate with lower urinary tract symptoms: Secondary | ICD-10-CM | POA: Diagnosis not present

## 2022-07-09 DIAGNOSIS — N402 Nodular prostate without lower urinary tract symptoms: Secondary | ICD-10-CM

## 2022-07-09 DIAGNOSIS — R350 Frequency of micturition: Secondary | ICD-10-CM | POA: Diagnosis not present

## 2022-07-09 LAB — MICROSCOPIC EXAMINATION: WBC, UA: >30 /hpf — AB (ref 0–5)

## 2022-07-09 LAB — URINALYSIS, COMPLETE
Bilirubin, UA: NEGATIVE
Glucose, UA: NEGATIVE
Nitrite, UA: NEGATIVE
Specific Gravity, UA: 1.025 (ref 1.005–1.030)
Urobilinogen, Ur: 0.2 mg/dL (ref 0.2–1.0)
pH, UA: 5 (ref 5.0–7.5)

## 2022-07-09 LAB — BLADDER SCAN AMB NON-IMAGING: Scan Result: 23

## 2022-07-09 MED ORDER — SILODOSIN 8 MG PO CAPS: 8.0000 mg | ORAL_CAPSULE | Freq: Every day | ORAL | 0 refills | Status: DC

## 2022-07-09 MED ORDER — LEVOFLOXACIN 500 MG PO TABS: 500.0000 mg | ORAL_TABLET | Freq: Every day | ORAL | 0 refills | Status: DC

## 2022-07-09 NOTE — Progress Notes (Signed)
07/09/2022 9:58 AM   [name] Sep 27, 1933 [id number]  CC: Chief Complaint  Patient presents with   Follow-up   HPI: [name] is a 88 y.o. male with PMH BPH with LUTS on finasteride who previously discontinued tamsulosin due to orthostasis who presents today for evaluation of urgency and frequency.   Today he reports acute worsening in difficulty voiding, urinary frequency, and weak stream accompanied by painful urgency and worsened low back pain over baseline starting last week.  He has had a gradual improvement in his urgency and frequency and feels that his stream is now stronger, but his symptoms have not resolved.  He denies a history of similar episodes in the past.  He denies fever, chills, nausea, vomiting, and perineal discomfort.  In-office UA today positive for trace ketones, 2+ blood, 2+ protein, and 2+ leukocytes; urine microscopy with >30 WBCs/HPF, 11-30 RBCs/HPF, and moderate bacteria. PVR 30m.  PMH: 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, 12/26/10, 06/21/08, 04/20/06, 04/11/03, 01/05/03   Cancer ([facility]    melanoma   Cardiac arrhythmia    Cataract    Chicken pox    Colon polyp 12/29/10, 04/18/06, 01/05/03   Diplopia    Dysrhythmia    Essential hypertension    Exotropia, left eye    GERD (gastroesophageal reflux disease)    Gout    H/O: CVA (cerebrovascular accident)    Hemorrhoid    Hepatic cyst    History of kidney stones    Low testosterone    [place] in [place]    left ear, right cheek   Migraine    Near syncope    Nephrolithiasis    Nephrolithiasis    Occlusion and stenosis of vertebral artery    Psoriasis    Reflux    Splenomegaly    Strabismus    Subclavian steal syndrome    Syncope and collapse    Thrombocytopenia (HCC)    Ventricular premature depolarization    Vertebral artery stenosis     Surgical History: Past Surgical History:   Procedure Laterality Date   APPENDECTOMY     COLONOSCOPY     COLONOSCOPY WITH PROPOFOL N/A 03/13/2016   Procedure: COLONOSCOPY WITH PROPOFOL;  Surgeon: MLollie Sails MD;  [facility];  Service: Endoscopy;  Laterality: N/A;   diplopia     ESOPHAGOGASTRODUODENOSCOPY  08/2014, 06/14/2012   ESOPHAGOGASTRODUODENOSCOPY (EGD) WITH PROPOFOL N/A 03/13/2016   Procedure: ESOPHAGOGASTRODUODENOSCOPY (EGD) WITH PROPOFOL;  Surgeon: MLollie Sails MD;  [facility];  Service: Endoscopy;  Laterality: N/A;   ESOPHAGOGASTRODUODENOSCOPY (EGD) WITH PROPOFOL N/A 07/15/2018   Procedure: ESOPHAGOGASTRODUODENOSCOPY (EGD) WITH PROPOFOL;  Surgeon: SLollie Sails MD;  [facility];  Service: Endoscopy;  Laterality: N/A;   EYE SURGERY  08/16/2003   eye sug   MOHS SURGERY  2011, 2012   left ear and right cheek   STRABISMUS SURGERY  10/20/2015   2 horizontal muscles-left   TONSILLECTOMY     WISDOM TOOTH EXTRACTION      Home Medications:  Allergies as of 07/09/2022       Reactions   Other Diarrhea   Other reaction(s): Abdominal Pain tarragon Other reaction(s): Abdominal Pain tarragon        Medication List        Accurate as of July 09, 2022  9:58 AM. If you have any questions, ask your nurse  or doctor.          calcium-vitamin D 500-200 MG-UNIT tablet Commonly known as: OSCAL WITH D Take 1 tablet by mouth daily with breakfast.   clobetasol ointment 0.05 % Commonly known as: TEMOVATE Apply topically.   desonide 0.05 % cream Commonly known as: DESOWEN Apply 1 application topically daily as needed.   ezetimibe 10 MG tablet Commonly known as: ZETIA TAKE ONE TABLET BY MOUTH EVERY DAY   finasteride 5 MG tablet Commonly known as: PROSCAR Take 1 tablet (5 mg total) by mouth daily.   folic acid 1 MG tablet Commonly known as: FOLVITE Take 1 tablet by mouth. 6 times a week   ketoconazole 2 % shampoo Commonly known as: NIZORAL Apply  topically 2 (two) times a week.   methotrexate 2.5 MG tablet Commonly known as: RHEUMATREX 2.5 mg 5 tablets once a week   Multi-Vitamins Tabs Take by mouth daily.   omeprazole 20 MG capsule Commonly known as: PRILOSEC Take 20 mg by mouth daily.   tacrolimus 0.1 % ointment Commonly known as: PROTOPIC   Testosterone 20.25 MG/ACT (1.62%) Gel Apply to shoulders and/or upper arms only 5 grams   valACYclovir 1000 MG tablet Commonly known as: VALTREX Take 500 mg by mouth daily.        Allergies:  Allergies  Allergen Reactions   Other Diarrhea    Other reaction(s): Abdominal Pain tarragon Other reaction(s): Abdominal Pain tarragon    Family History: Family History  Problem Relation Age of Onset   Heart failure Mother    Osteoporosis Mother    Stroke Mother    Heart attack Father    Gout Father    Diabetes Mellitus II Father    Renal Disease Father    Alcohol abuse Father    Osteoporosis Maternal Grandfather    Osteoporosis Maternal Grandmother     Social History:   reports that he quit smoking about 61 years ago. His smoking use included cigarettes. He has a 2.50 pack-year smoking history. He has never used smokeless tobacco. He reports current alcohol use. He reports that he does not use drugs.  Physical Exam: BP (!) 154/75   Pulse 62   Ht '5\' 10"'$  (1.778 m)   Wt 166 lb (75.3 kg)   BMI 23.82 kg/m   Constitutional:  Alert and oriented, no acute distress, nontoxic appearing HEENT: [place], AT Cardiovascular: No clubbing, cyanosis, or edema Respiratory: Normal respiratory effort, no increased work of breathing GU: Normal sphincter tone, boggy prostate, minimal prostatic tenderness.  There is a small firm nodule just left of the median sulcus in the mid gland. Skin: No rashes, bruises or suspicious lesions Neurologic: Grossly intact, no focal deficits, moving all 4 extremities Psychiatric: Normal mood and affect  Laboratory Data: Results for orders placed or  performed in visit on 07/09/22  Microscopic Examination   Urine  Result Value Ref Range   WBC, UA >30 (A) 0 - 5 /hpf   RBC, Urine 11-30 (A) 0 - 2 /hpf   Epithelial Cells (non renal) 0-10 0 - 10 /hpf   Bacteria, UA Moderate (A) None seen/Few  Urinalysis, Complete  Result Value Ref Range   Specific Gravity, UA 1.025 1.005 - 1.030   pH, UA 5.0 5.0 - 7.5   Color, UA Yellow Yellow   Appearance Ur Cloudy (A) Clear   Leukocytes,UA 2+ (A) Negative   Protein,UA 2+ (A) Negative/Trace   Glucose, UA Negative Negative   Ketones, UA Trace (A) Negative  RBC, UA 2+ (A) Negative   Bilirubin, UA Negative Negative   Urobilinogen, Ur 0.2 0.2 - 1.0 mg/dL   Nitrite, UA Negative Negative   Microscopic Examination See below:   Bladder Scan (Post Void Residual) in office  Result Value Ref Range   Scan Result 23    Assessment & Plan:   1. Benign prostatic hyperplasia with urinary frequency Acute worsening in voiding symptoms with low back pain and grossly infected urine consistent with chronic bacterial prostatitis.  Will start empiric Levaquin and silodosin and send for culture for further evaluation.  We discussed that he can stop the silodosin if he develops orthostasis on it as well.  We will plan to treat with 4 weeks of antibiotics.  He is in agreement with this plan. - Bladder Scan (Post Void Residual) in office - Urinalysis, Complete - CULTURE, URINE COMPREHENSIVE - silodosin (RAPAFLO) 8 MG CAPS capsule; Take 1 capsule (8 mg total) by mouth daily with breakfast.  Dispense: 30 capsule; Refill: 0 - levofloxacin (LEVAQUIN) 500 MG tablet; Take 1 tablet (500 mg total) by mouth daily.  Dispense: 28 tablet; Refill: 0  2. Prostate nodule Small nodule noted just left of the median sulcus on DRE today.  We will have him follow-up with Dr. Bernardo Heater next month for further evaluation after completing culture appropriate antibiotics.  Return in about 4 weeks (around 08/06/2022) for Symptom recheck, DRE with  Dr. Holley Dexter .  Debroah Loop, PA-C  [facility] [address], [place], [place] [address] [phone]

## 2022-07-12 LAB — CULTURE, URINE COMPREHENSIVE

## 2022-07-20 ENCOUNTER — Other Ambulatory Visit: Payer: Self-pay | Admitting: Physician Assistant

## 2022-07-20 DIAGNOSIS — N411 Chronic prostatitis: Secondary | ICD-10-CM

## 2022-07-20 MED ORDER — FOSFOMYCIN TROMETHAMINE 3 G PO PACK: PACK | ORAL | 0 refills | Status: DC

## 2022-08-09 ENCOUNTER — Ambulatory Visit: Payer: Medicare PPO | Admitting: Urology

## 2022-08-09 ENCOUNTER — Other Ambulatory Visit: Payer: Self-pay | Admitting: Physician Assistant

## 2022-08-09 DIAGNOSIS — N401 Enlarged prostate with lower urinary tract symptoms: Secondary | ICD-10-CM

## 2022-08-13 ENCOUNTER — Ambulatory Visit: Payer: Medicare PPO | Admitting: Urology

## 2022-08-13 ENCOUNTER — Encounter: Payer: Self-pay | Admitting: Urology

## 2022-08-13 VITALS — BP 170/83 | HR 73 | Ht 70.0 in | Wt 167.0 lb

## 2022-08-13 DIAGNOSIS — N411 Chronic prostatitis: Secondary | ICD-10-CM

## 2022-08-13 DIAGNOSIS — R8271 Bacteriuria: Secondary | ICD-10-CM

## 2022-08-13 DIAGNOSIS — R8281 Pyuria: Secondary | ICD-10-CM | POA: Diagnosis not present

## 2022-08-13 DIAGNOSIS — R350 Frequency of micturition: Secondary | ICD-10-CM | POA: Diagnosis not present

## 2022-08-13 DIAGNOSIS — R3915 Urgency of urination: Secondary | ICD-10-CM

## 2022-08-13 DIAGNOSIS — N401 Enlarged prostate with lower urinary tract symptoms: Secondary | ICD-10-CM

## 2022-08-13 NOTE — Progress Notes (Unsigned)
08/13/2022 10:05 AM   [name] 06/15/1934 [id number]  Referring provider: [name], MD [phone] [place],  [place] [address]  Chief Complaint  Patient presents with   Follow-up    Urologic history: 1.  BPH with LUTS -Finasteride; tamsulosin discontinued 10/2019 due to orthostatic symptoms -Cystoscopy 02/2018 elevated bladder neck, trilobar enlargement  HPI: 88 y.o. male presents for follow-up.  Acute PA visit last month for worsening LUTS, frequency and urgency UA with pyuria and bacteriuria; PVR 23 mL Silodosin was added Remains on finasteride Was also noted to have a small prostate nodule Currently is doing much better.  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, 12/26/10, 06/21/08, 04/20/06, 04/11/03, 01/05/03   Cancer ([place])    melanoma   Cardiac arrhythmia    Cataract    Chicken pox    Colon polyp 12/29/10, 04/18/06, 01/05/03   Diplopia    Dysrhythmia    Essential hypertension    Exotropia, left eye    GERD (gastroesophageal reflux disease)    Gout    H/O: CVA (cerebrovascular accident)    Hemorrhoid    Hepatic cyst    History of kidney stones    Low testosterone    Melanoma in situ ([place])    left ear, right cheek   Migraine    Near syncope    Nephrolithiasis    Nephrolithiasis    Occlusion and stenosis of vertebral artery    Psoriasis    Reflux    Splenomegaly    Strabismus    Subclavian steal syndrome    Syncope and collapse    Thrombocytopenia (HCC)    Ventricular premature depolarization    Vertebral artery stenosis     Surgical History: Past Surgical History:  Procedure Laterality Date   APPENDECTOMY     COLONOSCOPY     COLONOSCOPY WITH PROPOFOL N/A 03/13/2016   Procedure: COLONOSCOPY WITH PROPOFOL;  Surgeon: Lollie Sails, MD;  [facility];  Service: Endoscopy;  Laterality:  N/A;   diplopia     ESOPHAGOGASTRODUODENOSCOPY  08/2014, 06/14/2012   ESOPHAGOGASTRODUODENOSCOPY (EGD) WITH PROPOFOL N/A 03/13/2016   Procedure: ESOPHAGOGASTRODUODENOSCOPY (EGD) WITH PROPOFOL;  Surgeon: Lollie Sails, MD;  [facility];  Service: Endoscopy;  Laterality: N/A;   ESOPHAGOGASTRODUODENOSCOPY (EGD) WITH PROPOFOL N/A 07/15/2018   Procedure: ESOPHAGOGASTRODUODENOSCOPY (EGD) WITH PROPOFOL;  Surgeon: Lollie Sails, MD;  [facility];  Service: Endoscopy;  Laterality: N/A;   EYE SURGERY  08/16/2003   eye sug   MOHS SURGERY  2011, 2012   left ear and right cheek   STRABISMUS SURGERY  10/20/2015   2 horizontal muscles-left   TONSILLECTOMY     WISDOM TOOTH EXTRACTION      Home Medications:  Allergies as of 08/13/2022   No Active Allergies      Medication List        Accurate as of August 13, 2022 10:05 AM. If you have any questions, ask your nurse or doctor.          STOP taking these medications    levofloxacin 500 MG tablet Commonly known as: Levaquin Stopped by: Abbie Sons, MD       TAKE these medications    calcium-vitamin D 500-200 MG-UNIT tablet Commonly known as: OSCAL WITH D Take 1 tablet by mouth daily with breakfast.  clobetasol ointment 0.05 % Commonly known as: TEMOVATE Apply topically.   desonide 0.05 % cream Commonly known as: DESOWEN Apply 1 application  topically daily as needed.   ezetimibe 10 MG tablet Commonly known as: ZETIA TAKE ONE TABLET BY MOUTH EVERY DAY   finasteride 5 MG tablet Commonly known as: PROSCAR Take 1 tablet (5 mg total) by mouth daily.   folic acid 1 MG tablet Commonly known as: FOLVITE Take 1 tablet by mouth. 6 times a week   fosfomycin 3 g Pack Commonly known as: MONUROL Take one packet daily x7 days, then one packet every other day for a total of 31 days of antibiotic therapy.   ketoconazole 2 % shampoo Commonly known as: NIZORAL Apply topically 2 (two) times a  week.   methotrexate 2.5 MG tablet Commonly known as: RHEUMATREX 2.5 mg 5 tablets once a week   Multi-Vitamins Tabs Take by mouth daily.   omeprazole 20 MG capsule Commonly known as: PRILOSEC Take 20 mg by mouth daily.   silodosin 8 MG Caps capsule Commonly known as: RAPAFLO TAKE 1 CAPSULE BY MOUTH ONCE DAILY WITH BREAKFAST   tacrolimus 0.1 % ointment Commonly known as: PROTOPIC   Testosterone 20.25 MG/ACT (1.62%) Gel Apply to shoulders and/or upper arms only 5 grams   valACYclovir 1000 MG tablet Commonly known as: VALTREX Take 500 mg by mouth daily.        Allergies:  No Active Allergies   Family History: Family History  Problem Relation Age of Onset   Heart failure Mother    Osteoporosis Mother    Stroke Mother    Heart attack Father    Gout Father    Diabetes Mellitus II Father    Renal Disease Father    Alcohol abuse Father    Osteoporosis Maternal Grandfather    Osteoporosis Maternal Grandmother     Social History:  reports that he quit smoking about 61 years ago. His smoking use included cigarettes. He has a 2.50 pack-year smoking history. He has never used smokeless tobacco. He reports current alcohol use. He reports that he does not use drugs.   Physical Exam: BP (!) 170/83   Pulse 73   Ht '5\' 10"'$  (1.778 m)   Wt 167 lb (75.8 kg)   BMI 23.96 kg/m   Constitutional:  Alert and oriented, No acute distress. HEENT: [place] AT, moist mucus membranes.  Trachea midline, no masses. Cardiovascular: No clubbing, cyanosis, or edema. Respiratory: Normal respiratory effort, no increased work of breathing. GU: 35-40 g; previously described prostate nodule is no longer present Psychiatric: Normal mood and affect.   Assessment & Plan:    1.  BPH with LUTS Stable Finasteride refilled  Silodosin refilled Prostate nodule no longer appreciated Continue annual follow-up  2.  Urinary tract infection Most likely prostatitis Initially fluoroquinolone however was  having tendinitis and was treated with fosfomycin dosing for prostatitis Repeat UA today   Abbie Sons, [place] [address] [place], [place] [address] [phone]

## 2022-08-15 ENCOUNTER — Encounter: Payer: Self-pay | Admitting: Urology

## 2022-08-17 ENCOUNTER — Other Ambulatory Visit: Payer: Self-pay | Admitting: Cardiovascular Disease

## 2022-09-04 ENCOUNTER — Other Ambulatory Visit: Payer: Medicare PPO

## 2022-09-14 ENCOUNTER — Other Ambulatory Visit: Payer: Self-pay | Admitting: Physician Assistant

## 2022-09-14 DIAGNOSIS — N401 Enlarged prostate with lower urinary tract symptoms: Secondary | ICD-10-CM

## 2022-10-12 ENCOUNTER — Ambulatory Visit: Payer: Medicare PPO | Admitting: Urology

## 2022-11-13 ENCOUNTER — Other Ambulatory Visit: Payer: Self-pay | Admitting: Urology

## 2022-12-04 ENCOUNTER — Other Ambulatory Visit: Payer: Medicare PPO

## 2022-12-04 ENCOUNTER — Other Ambulatory Visit: Payer: Self-pay | Admitting: *Deleted

## 2022-12-04 DIAGNOSIS — N411 Chronic prostatitis: Secondary | ICD-10-CM

## 2022-12-04 LAB — MICROSCOPIC EXAMINATION: Bacteria, UA: NONE SEEN

## 2022-12-04 LAB — URINALYSIS, COMPLETE
Bilirubin, UA: NEGATIVE
Glucose, UA: NEGATIVE
Ketones, UA: NEGATIVE
Leukocytes,UA: NEGATIVE
Nitrite, UA: NEGATIVE
RBC, UA: NEGATIVE
Specific Gravity, UA: >1.03 — ABNORMAL HIGH (ref 1.005–1.030)
Urobilinogen, Ur: 0.2 mg/dL (ref 0.2–1.0)
pH, UA: 5 (ref 5.0–7.5)

## 2022-12-11 ENCOUNTER — Other Ambulatory Visit: Payer: Self-pay | Admitting: *Deleted

## 2022-12-11 DIAGNOSIS — N200 Calculus of kidney: Secondary | ICD-10-CM

## 2022-12-12 ENCOUNTER — Encounter: Payer: Self-pay | Admitting: Urology

## 2022-12-12 ENCOUNTER — Ambulatory Visit: Payer: Medicare PPO | Admitting: Urology

## 2022-12-12 VITALS — BP 147/87 | HR 82 | Ht 70.0 in | Wt 168.0 lb

## 2022-12-12 DIAGNOSIS — N401 Enlarged prostate with lower urinary tract symptoms: Secondary | ICD-10-CM

## 2022-12-12 DIAGNOSIS — R350 Frequency of micturition: Secondary | ICD-10-CM | POA: Diagnosis not present

## 2022-12-12 LAB — BLADDER SCAN AMB NON-IMAGING: Scan Result: 60

## 2022-12-12 MED ORDER — SILODOSIN 8 MG PO CAPS: 8.0000 mg | ORAL_CAPSULE | Freq: Every day | ORAL | 3 refills | Status: DC

## 2022-12-12 NOTE — Progress Notes (Signed)
[name],acting as a scribe for Kenneth Altes, MD.,have documented all relevant documentation on the behalf of Kenneth Altes, MD,as directed by  Kenneth Altes, MD while in the presence of Kenneth Altes, MD.  12/12/2022 10:50 AM   [name] [birth year] [id number]  Referring provider: [name], MD [phone] [name] [place],  [place] [address]  Chief Complaint  Patient presents with   Benign Prostatic Hypertrophy    Urologic history: 1.  BPH with LUTS - Finasteride; tamsulosin discontinued 10/2019 due to orthostatic symptoms - Cystoscopy 02/2018 elevated bladder neck, trilobar enlargement - Added silodosin to finasteride in 07/2022 after increased urinary symptoms; urine culture grew Enterococcus and he completed antibiotic therapy  HPI: 89 y.o. male presents for annual follow-up.  Since his last annual visit, he was seen in 07/2022 by our PA for worsening lower urinary tract symptoms and pyuria. Silodosin was added to finasteride and he has noted significant improvement in his voiding pattern. No complaints today  PMH: 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, 12/26/10, 06/21/08, 04/20/06, 04/11/03, 01/05/03   Cancer    melanoma   Cardiac arrhythmia    Cataract    Chicken pox    Colon polyp 12/29/10, 04/18/06, 01/05/03   Diplopia    Dysrhythmia    Essential hypertension    Exotropia, left eye    GERD (gastroesophageal reflux disease)    Gout    H/O: CVA (cerebrovascular accident)    Hemorrhoid    Hepatic cyst    History of kidney stones    Low testosterone    Melanoma in situ    left ear, right cheek   Migraine    Near syncope    Nephrolithiasis    Nephrolithiasis    Occlusion and stenosis of vertebral artery    Psoriasis    Reflux    Splenomegaly    Strabismus    Subclavian steal syndrome    Syncope and collapse    Thrombocytopenia    Ventricular  premature depolarization    Vertebral artery stenosis     Surgical History: Past Surgical History:  Procedure Laterality Date   APPENDECTOMY     COLONOSCOPY     COLONOSCOPY WITH PROPOFOL N/A 03/13/2016   Procedure: COLONOSCOPY WITH PROPOFOL;  Surgeon: Christena Deem, MD;  Location: [facility] END[facility]COPY;  Service: Endoscopy;  Laterality: N/A;   diplopia     ESOPHAGOGASTRODUODENOSCOPY  08/2014, 06/14/2012   ESOPHAGOGASTRODUODENOSCOPY (EGD) WITH PROPOFOL N/A 03/13/2016   Procedure: ESOPHAGOGASTRODUODENOSCOPY (EGD) WITH PROPOFOL;  Surgeon: Christena Deem, MD;  Location: [facility] END[facility]COPY;  Service: Endoscopy;  Laterality: N/A;   ESOPHAGOGASTRODUODENOSCOPY (EGD) WITH PROPOFOL N/A 07/15/2018   Procedure: ESOPHAGOGASTRODUODENOSCOPY (EGD) WITH PROPOFOL;  Surgeon: Christena Deem, MD;  [facility];  Service: Endoscopy;  Laterality: N/A;   EYE SURGERY  08/16/2003   eye sug   MOHS SURGERY  2011, 2012   left ear and right cheek   STRABISMUS SURGERY  10/20/2015   2 horizontal muscles-left   TONSILLECTOMY     WISDOM TOOTH EXTRACTION      Home Medications:  Allergies as of 12/12/2022   No Active Allergies      Medication List        Accurate as of December 12, 2022 10:50 AM. If you have any questions, ask your nurse or doctor.  STOP taking these medications    desonide 0.05 % cream Commonly known as: DESOWEN Stopped by: Kenneth Altes, MD   fosfomycin 3 g Pack Commonly known as: MONUROL Stopped by: Kenneth Altes, MD   tacrolimus 0.1 % ointment Commonly known as: PROTOPIC Stopped by: Kenneth Altes, MD       TAKE these medications    calcium-vitamin D 500-200 MG-UNIT tablet Commonly known as: OSCAL WITH D Take 1 tablet by mouth daily with breakfast.   clobetasol ointment 0.05 % Commonly known as: TEMOVATE Apply topically.   ezetimibe 10 MG tablet Commonly known as: ZETIA TAKE ONE TABLET BY MOUTH EVERY DAY   finasteride 5 MG tablet Commonly  known as: PROSCAR TAKE ONE TABLET BY MOUTH EVERY DAY   folic acid 1 MG tablet Commonly known as: FOLVITE Take 1 tablet by mouth. 6 times a week   ketoconazole 2 % shampoo Commonly known as: NIZORAL Apply topically 2 (two) times a week.   methotrexate 2.5 MG tablet Commonly known as: RHEUMATREX 2.5 mg 5 tablets once a week   Multi-Vitamins Tabs Take by mouth daily.   omeprazole 20 MG capsule Commonly known as: PRILOSEC Take 20 mg by mouth daily.   silodosin 8 MG Caps capsule Commonly known as: RAPAFLO Take 1 capsule (8 mg total) by mouth daily with breakfast.   Testosterone 20.25 MG/ACT (1.62%) Gel Apply to shoulders and/or upper arms only 5 grams   valACYclovir 1000 MG tablet Commonly known as: VALTREX Take 500 mg by mouth daily.         Family History: Family History  Problem Relation Age of Onset   Heart failure Mother    Osteoporosis Mother    Stroke Mother    Heart attack Father    Gout Father    Diabetes Mellitus II Father    Renal Disease Father    Alcohol abuse Father    Osteoporosis Maternal Grandfather    Osteoporosis Maternal Grandmother     Social History:  reports that he quit smoking about 61 years ago. His smoking use included cigarettes. He has a 2.50 pack-year smoking history. He has never used smokeless tobacco. He reports current alcohol use. He reports that he does not use drugs.   Physical Exam: BP (!) 147/87   Pulse 82   Ht 5\' 10"  (1.778 m)   Wt 168 lb (76.2 kg)   BMI 24.11 kg/m   Constitutional:  Alert and oriented, No acute distress. HEENT: [place] AT, moist mucus membranes.  Trachea midline, no masses. Cardiovascular: No clubbing, cyanosis, or edema. Respiratory: Normal respiratory effort, no increased work of breathing. Psychiatric: Normal mood and affect.  Laboratory data:  Results for orders placed or performed in visit on 12/12/22  Bladder Scan (Post Void Residual) in office  Result Value Ref Range   Scan Result 60      Assessment & Plan:    1.  BPH with LUTS Stable lower urinary tract symptoms Symptoms significantly improved with the addition of silodosin, which was refilled. PVR today was 60 mL  Continuing annual follow up with PVR  I have reviewed the above documentation for accuracy and completeness, and I agree with the above.   Kenneth Altes, MD  [facility] [address], [address] [place], [place] [address] [phone]

## 2023-02-18 ENCOUNTER — Other Ambulatory Visit: Payer: Self-pay | Admitting: Cardiovascular Disease

## 2023-02-18 NOTE — Telephone Encounter (Signed)
Pt scheduled on 6/28.

## 2023-02-18 NOTE — Telephone Encounter (Signed)
Hi,   Could you schedule this patient a 12 month follow up appointment? The patient was last seen by Dr. Mariah Milling on 09-19-21. Thank you so much.
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
## 2023-03-01 ENCOUNTER — Encounter: Payer: Self-pay | Admitting: Cardiovascular Disease

## 2023-03-01 ENCOUNTER — Ambulatory Visit: Payer: Medicare PPO | Attending: Cardiovascular Disease | Admitting: Cardiovascular Disease

## 2023-03-01 VITALS — BP 138/66 | HR 66 | Ht 70.0 in | Wt 169.1 lb

## 2023-03-01 DIAGNOSIS — I498 Other specified cardiac arrhythmias: Secondary | ICD-10-CM

## 2023-03-01 DIAGNOSIS — I739 Peripheral vascular disease, unspecified: Secondary | ICD-10-CM

## 2023-03-01 DIAGNOSIS — I6501 Occlusion and stenosis of right vertebral artery: Secondary | ICD-10-CM | POA: Diagnosis not present

## 2023-03-01 DIAGNOSIS — R55 Syncope and collapse: Secondary | ICD-10-CM

## 2023-03-01 DIAGNOSIS — I10 Essential (primary) hypertension: Secondary | ICD-10-CM | POA: Diagnosis not present

## 2023-03-01 DIAGNOSIS — I493 Ventricular premature depolarization: Secondary | ICD-10-CM

## 2023-03-01 DIAGNOSIS — G458 Other transient cerebral ischemic attacks and related syndromes: Secondary | ICD-10-CM | POA: Diagnosis not present

## 2023-03-01 DIAGNOSIS — R0789 Other chest pain: Secondary | ICD-10-CM | POA: Diagnosis not present

## 2023-03-01 MED ORDER — EZETIMIBE 10 MG PO TABS: 10.0000 mg | ORAL_TABLET | Freq: Every day | ORAL | 3 refills | Status: DC

## 2023-03-01 NOTE — Patient Instructions (Addendum)
Medication Instructions:  No changes  Research NEXLETOL (bempedoic acid) for 20% drop in cholesterol  If you need a refill on your cardiac medications before your next appointment, please call your pharmacy.   Lab work: No new labs needed  Testing/Procedures: No new testing needed  Follow-Up: At [facility], you and your health needs are our priority.  As part of our continuing mission to provide you with exceptional heart care, we have created designated Provider Care Teams.  These Care Teams include your primary Cardiologist (physician) and Advanced Practice Providers (APPs -  Physician Assistants and Nurse Practitioners) who all work together to provide you with the care you need, when you need it.  You will need a follow up appointment in 12 months  Providers on your designated Care Team:   Nicolasa Ducking, NP Eula Listen, PA-C Cadence Fransico Michael, [place]  COVID-19 Vaccine Information can be found at: [email or web address] For questions related to vaccine distribution or appointments, please email [email or web address] or call [phone].

## 2023-03-02 ENCOUNTER — Inpatient Hospital Stay (HOSPITAL_COMMUNITY)
Admission: EM | Admit: 2023-03-02 | Discharge: 2023-03-07 | DRG: 084 | Disposition: A | Discharge status: Home Health | Payer: Medicare PPO | Attending: Surgery | Admitting: Surgery

## 2023-03-02 ENCOUNTER — Inpatient Hospital Stay (HOSPITAL_COMMUNITY): Payer: Medicare PPO

## 2023-03-02 ENCOUNTER — Other Ambulatory Visit: Payer: Self-pay

## 2023-03-02 ENCOUNTER — Emergency Department (HOSPITAL_COMMUNITY): Payer: Medicare PPO

## 2023-03-02 ENCOUNTER — Encounter (HOSPITAL_COMMUNITY): Payer: Self-pay | Admitting: *Deleted

## 2023-03-02 DIAGNOSIS — S61412A Laceration without foreign body of left hand, initial encounter: Secondary | ICD-10-CM

## 2023-03-02 DIAGNOSIS — T07XXXA Unspecified multiple injuries, initial encounter: Secondary | ICD-10-CM

## 2023-03-02 DIAGNOSIS — S062XAA Diffuse traumatic brain injury with loss of consciousness status unknown, initial encounter: Secondary | ICD-10-CM

## 2023-03-02 DIAGNOSIS — S50811A Abrasion of right forearm, initial encounter: Secondary | ICD-10-CM | POA: Diagnosis present

## 2023-03-02 DIAGNOSIS — S065X9A Traumatic subdural hemorrhage with loss of consciousness of unspecified duration, initial encounter: Principal | ICD-10-CM | POA: Diagnosis present

## 2023-03-02 DIAGNOSIS — Z79899 Other long term (current) drug therapy: Secondary | ICD-10-CM | POA: Diagnosis not present

## 2023-03-02 DIAGNOSIS — S40211A Abrasion of right shoulder, initial encounter: Secondary | ICD-10-CM | POA: Diagnosis present

## 2023-03-02 DIAGNOSIS — S0031XA Abrasion of nose, initial encounter: Secondary | ICD-10-CM | POA: Diagnosis present

## 2023-03-02 DIAGNOSIS — Y9355 Activity, bike riding: Secondary | ICD-10-CM | POA: Diagnosis not present

## 2023-03-02 DIAGNOSIS — R0902 Hypoxemia: Secondary | ICD-10-CM | POA: Diagnosis present

## 2023-03-02 DIAGNOSIS — R413 Other amnesia: Secondary | ICD-10-CM | POA: Diagnosis present

## 2023-03-02 DIAGNOSIS — S80212A Abrasion, left knee, initial encounter: Secondary | ICD-10-CM | POA: Diagnosis present

## 2023-03-02 DIAGNOSIS — S70211A Abrasion, right hip, initial encounter: Secondary | ICD-10-CM | POA: Diagnosis present

## 2023-03-02 DIAGNOSIS — S066X9A Traumatic subarachnoid hemorrhage with loss of consciousness of unspecified duration, initial encounter: Secondary | ICD-10-CM | POA: Diagnosis present

## 2023-03-02 DIAGNOSIS — H9190 Unspecified hearing loss, unspecified ear: Secondary | ICD-10-CM | POA: Diagnosis present

## 2023-03-02 DIAGNOSIS — Y92410 Unspecified street and highway as the place of occurrence of the external cause: Secondary | ICD-10-CM | POA: Diagnosis not present

## 2023-03-02 DIAGNOSIS — S80211A Abrasion, right knee, initial encounter: Secondary | ICD-10-CM | POA: Diagnosis present

## 2023-03-02 DIAGNOSIS — R402412 Glasgow coma scale score 13-15, at arrival to emergency department: Secondary | ICD-10-CM | POA: Diagnosis present

## 2023-03-02 DIAGNOSIS — S80811A Abrasion, right lower leg, initial encounter: Secondary | ICD-10-CM | POA: Diagnosis present

## 2023-03-02 LAB — ETHANOL: Alcohol, Ethyl (B): <10 mg/dL (ref <10)

## 2023-03-02 LAB — COMPREHENSIVE METABOLIC PANEL WITH GFR
ALT: 32 U/L (ref 0–44)
AST: 41 U/L (ref 15–41)
Albumin: 4.1 g/dL (ref 3.5–5.0)
Alkaline Phosphatase: 54 U/L (ref 38–126)
Anion gap: 10 (ref 5–15)
BUN: 19 mg/dL (ref 8–23)
CO2: 23 mmol/L (ref 22–32)
Calcium: 8.8 mg/dL — ABNORMAL LOW (ref 8.9–10.3)
Chloride: 107 mmol/L (ref 98–111)
Creatinine, Ser: 1.41 mg/dL — ABNORMAL HIGH (ref 0.61–1.24)
GFR, Estimated: 48 mL/min — ABNORMAL LOW
Glucose, Bld: 159 mg/dL — ABNORMAL HIGH (ref 70–99)
Potassium: 4 mmol/L (ref 3.5–5.1)
Sodium: 140 mmol/L (ref 135–145)
Total Bilirubin: 1.2 mg/dL (ref 0.3–1.2)
Total Protein: 6.2 g/dL — ABNORMAL LOW (ref 6.5–8.1)

## 2023-03-02 LAB — PROTIME-INR
INR: 1 (ref 0.8–1.2)
Prothrombin Time: 13.5 s (ref 11.4–15.2)

## 2023-03-02 LAB — SAMPLE TO BLOOD BANK

## 2023-03-02 LAB — CBC
HCT: 47.7 % (ref 39.0–52.0)
Hemoglobin: 16.1 g/dL (ref 13.0–17.0)
MCH: 32.4 pg (ref 26.0–34.0)
MCHC: 33.8 g/dL (ref 30.0–36.0)
MCV: 96 fL (ref 80.0–100.0)
Platelets: 91 10*3/uL — ABNORMAL LOW (ref 150–400)
RBC: 4.97 MIL/uL (ref 4.22–5.81)
RDW: 13.4 % (ref 11.5–15.5)
WBC: 4.3 10*3/uL (ref 4.0–10.5)
nRBC: 0 % (ref 0.0–0.2)

## 2023-03-02 MED ORDER — DOCUSATE SODIUM 100 MG PO CAPS
100.0000 mg | ORAL_CAPSULE | Freq: Two times a day (BID) | ORAL | Status: DC
  Administered 2023-03-04 – 2023-03-07 (×7): 100 mg via ORAL
  Filled 2023-03-02 (×7): qty 1

## 2023-03-02 MED ORDER — PANTOPRAZOLE SODIUM 40 MG PO TBEC
40.0000 mg | DELAYED_RELEASE_TABLET | Freq: Every day | ORAL | Status: DC
  Administered 2023-03-03 – 2023-03-07 (×5): 40 mg via ORAL
  Filled 2023-03-02 (×5): qty 1

## 2023-03-02 MED ORDER — TAMSULOSIN HCL 0.4 MG PO CAPS
0.4000 mg | ORAL_CAPSULE | Freq: Every day | ORAL | Status: DC
  Administered 2023-03-03 – 2023-03-06 (×4): 0.4 mg via ORAL
  Filled 2023-03-02 (×4): qty 1

## 2023-03-02 MED ORDER — HYDROMORPHONE HCL 1 MG/ML IJ SOLN
0.5000 mg | INTRAMUSCULAR | Status: DC | PRN
  Filled 2023-03-02 (×2): qty 0.5

## 2023-03-02 MED ORDER — IOHEXOL 350 MG/ML SOLN
75.0000 mL | Freq: Once | INTRAVENOUS | Status: AC | PRN
  Administered 2023-03-02: 75 mL via INTRAVENOUS

## 2023-03-02 MED ORDER — METHOCARBAMOL 1000 MG/10ML IJ SOLN
500.0000 mg | Freq: Three times a day (TID) | INTRAVENOUS | Status: DC
  Administered 2023-03-02: 500 mg via INTRAVENOUS
  Filled 2023-03-02: qty 500
  Filled 2023-03-02 (×2): qty 5

## 2023-03-02 MED ORDER — HYDRALAZINE HCL 20 MG/ML IJ SOLN: 10.0000 mg | INTRAMUSCULAR | Status: DC | PRN

## 2023-03-02 MED ORDER — ONDANSETRON HCL 4 MG/2ML IJ SOLN: 4.0000 mg | Freq: Four times a day (QID) | INTRAMUSCULAR | Status: DC | PRN

## 2023-03-02 MED ORDER — METHOCARBAMOL 500 MG PO TABS
500.0000 mg | ORAL_TABLET | Freq: Three times a day (TID) | ORAL | Status: DC
  Administered 2023-03-03 – 2023-03-04 (×6): 500 mg via ORAL
  Filled 2023-03-02 (×5): qty 1

## 2023-03-02 MED ORDER — TETANUS-DIPHTH-ACELL PERTUSSIS 5-2.5-18.5 LF-MCG/0.5 IM SUSY
PREFILLED_SYRINGE | INTRAMUSCULAR | Status: AC
  Filled 2023-03-02: qty 0.5

## 2023-03-02 MED ORDER — ONDANSETRON 4 MG PO TBDP: 4.0000 mg | ORAL_TABLET | Freq: Four times a day (QID) | ORAL | Status: DC | PRN

## 2023-03-02 MED ORDER — TETANUS-DIPHTH-ACELL PERTUSSIS 5-2.5-18.5 LF-MCG/0.5 IM SUSY: 0.5000 mL | PREFILLED_SYRINGE | Freq: Once | INTRAMUSCULAR | Status: DC

## 2023-03-02 MED ORDER — POLYETHYLENE GLYCOL 3350 17 G PO PACK: 17.0000 g | PACK | Freq: Every day | ORAL | Status: DC | PRN

## 2023-03-02 MED ORDER — METOPROLOL TARTRATE 5 MG/5ML IV SOLN: 5.0000 mg | Freq: Four times a day (QID) | INTRAVENOUS | Status: DC | PRN

## 2023-03-02 MED ORDER — TRAMADOL HCL 50 MG PO TABS
50.0000 mg | ORAL_TABLET | Freq: Four times a day (QID) | ORAL | Status: DC | PRN
  Filled 2023-03-02 (×2): qty 1

## 2023-03-02 MED ORDER — LEVETIRACETAM IN NACL 500 MG/100ML IV SOLN
500.0000 mg | Freq: Two times a day (BID) | INTRAVENOUS | Status: DC
  Administered 2023-03-02 – 2023-03-04 (×4): 500 mg via INTRAVENOUS
  Filled 2023-03-02 (×4): qty 100

## 2023-03-02 MED ORDER — FINASTERIDE 5 MG PO TABS
5.0000 mg | ORAL_TABLET | Freq: Every day | ORAL | Status: DC
  Administered 2023-03-03 – 2023-03-07 (×5): 5 mg via ORAL
  Filled 2023-03-02 (×5): qty 1

## 2023-03-02 MED ORDER — LIDOCAINE HCL (PF) 1 % IJ SOLN
10.0000 mL | Freq: Once | INTRAMUSCULAR | Status: DC
  Filled 2023-03-02: qty 10

## 2023-03-02 MED ORDER — ACETAMINOPHEN 500 MG PO TABS
1000.0000 mg | ORAL_TABLET | Freq: Four times a day (QID) | ORAL | Status: DC
  Administered 2023-03-03 – 2023-03-06 (×11): 1000 mg via ORAL
  Filled 2023-03-02 (×11): qty 2
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
## 2023-03-02 NOTE — ED Notes (Signed)
Wife at bedside.
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
## 2023-03-02 NOTE — Progress Notes (Addendum)
Noted patient previously oriented x 3-4 and easily reoriented, but now oriented only to self and unable to recall his wife's name. Patient pupils also unequal, with left pupil 3 round and brisk, right pupil 4 round and brisk. Speech somewhat garbled. Paged Dr. Yetta Barre and awaiting new orders.

## 2023-03-02 NOTE — ED Provider Notes (Signed)
..  Laceration Repair  Date/Time: 03/02/2023 12:46 PM  Performed by: Jeanelle Malling, PA Authorized by: Jeanelle Malling, PA   Consent:    Consent obtained:  Verbal   Consent given by:  Patient   Risks discussed:  Infection and pain Universal protocol:    Procedure explained and questions answered to patient or proxy's satisfaction: yes     Relevant documents present and verified: yes     Imaging studies available: yes     Patient identity confirmed:  Verbally with patient and arm band Laceration details:    Location:  Hand   Hand location:  L hand, dorsum   Length (cm):  1   Depth (mm):  2 Pre-procedure details:    Preparation:  Patient was prepped and draped in usual sterile fashion and imaging obtained to evaluate for foreign bodies Exploration:    Imaging obtained: x-ray     Imaging outcome: foreign body not noted     Contaminated: yes   Treatment:    Area cleansed with:  Povidone-iodine   Amount of cleaning:  Standard   Irrigation solution:  Sterile saline   Irrigation volume:  50   Irrigation method:  Pressure wash   Visualized foreign bodies/material removed: no     Debridement:  Minimal Skin repair:    Repair method:  Sutures   Suture size:  5-0   Suture material:  Nylon   Suture technique:  Simple interrupted   Number of sutures:  2 Approximation:    Approximation:  Close Repair type:    Repair type:  Simple Post-procedure details:    Procedure completion:  Tolerated well, no immediate complications     Jeanelle Malling, PA 03/02/23 1248    Benjiman Core, MD 03/03/23 [phone]
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
## 2023-03-03 ENCOUNTER — Inpatient Hospital Stay (HOSPITAL_COMMUNITY): Payer: Medicare PPO

## 2023-03-03 LAB — BASIC METABOLIC PANEL
Anion gap: 9 (ref 5–15)
BUN: 26 mg/dL — ABNORMAL HIGH (ref 8–23)
CO2: 24 mmol/L (ref 22–32)
Calcium: 8.5 mg/dL — ABNORMAL LOW (ref 8.9–10.3)
Chloride: 106 mmol/L (ref 98–111)
Creatinine, Ser: 1.44 mg/dL — ABNORMAL HIGH (ref 0.61–1.24)
GFR, Estimated: 47 mL/min — ABNORMAL LOW (ref >60)
Glucose, Bld: 135 mg/dL — ABNORMAL HIGH (ref 70–99)
Potassium: 4 mmol/L (ref 3.5–5.1)
Sodium: 139 mmol/L (ref 135–145)

## 2023-03-03 LAB — CBC
HCT: 44.7 % (ref 39.0–52.0)
Hemoglobin: 15.2 g/dL (ref 13.0–17.0)
MCH: 33 pg (ref 26.0–34.0)
MCHC: 34 g/dL (ref 30.0–36.0)
MCV: 97.2 fL (ref 80.0–100.0)
Platelets: 89 10*3/uL — ABNORMAL LOW (ref 150–400)
RBC: 4.6 MIL/uL (ref 4.22–5.81)
RDW: 13.8 % (ref 11.5–15.5)
WBC: 9.1 10*3/uL (ref 4.0–10.5)
nRBC: 0 % (ref 0.0–0.2)

## 2023-03-03 MED ORDER — CHLORHEXIDINE GLUCONATE CLOTH 2 % EX PADS
6.0000 | MEDICATED_PAD | Freq: Every day | CUTANEOUS | Status: DC
  Administered 2023-03-03 – 2023-03-07 (×5): 6 via TOPICAL

## 2023-03-03 MED ORDER — LACTATED RINGERS IV BOLUS
500.0000 mL | Freq: Once | INTRAVENOUS | Status: AC
  Administered 2023-03-03: 500 mL via INTRAVENOUS

## 2023-03-03 NOTE — Progress Notes (Signed)
NEUROSURGERY PROGRESS NOTE  Doing well. No headaches or neck pain. I did clear him clinically from a C-spine standpoint, ok to discontinue collar. Ok to transfer to floor from our standpoint. Will follow up with him in the office in a week after discharge. Will sign off for now.  Temp:  [97.6 F (36.4 C)-100.9 F (38.3 C)] 99.4 F (37.4 C) (06/29 2343) Pulse Rate:  [56-125] 56 (06/30 0800) Resp:  [10-28] 16 (06/30 0800) BP: (104-158)/(59-98) 111/61 (06/30 0800) SpO2:  [88 %-100 %] 93 % (06/30 0800) Weight:  [76.2 kg] 76.2 kg (06/29 1050)    Sherryl Manges, NP 03/03/2023 9:56 AM
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
## 2023-03-04 MED ORDER — LEVETIRACETAM 500 MG PO TABS
500.0000 mg | ORAL_TABLET | Freq: Two times a day (BID) | ORAL | Status: DC
  Administered 2023-03-04 – 2023-03-07 (×6): 500 mg via ORAL
  Filled 2023-03-04 (×6): qty 1

## 2023-03-04 MED ORDER — OXYCODONE HCL 5 MG PO TABS
2.5000 mg | ORAL_TABLET | ORAL | Status: DC | PRN
  Administered 2023-03-06 – 2023-03-07 (×5): 5 mg via ORAL
  Filled 2023-03-04 (×5): qty 1
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
## 2023-03-05 MED ORDER — BACITRACIN ZINC 500 UNIT/GM EX OINT
TOPICAL_OINTMENT | Freq: Every day | CUTANEOUS | Status: DC
  Administered 2023-03-07: 1 via TOPICAL
  Filled 2023-03-05: qty 28.4
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
## 2023-03-05 NOTE — Discharge Instructions (Signed)
Sutures from left hand should be removed between 7/6 - 7/9
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
## 2023-03-05 NOTE — Care Management Important Message (Signed)
Important Message  Patient Details  Name: [name] MRN: [id number] Date of Birth: 02-27-1934   Medicare Important Message Given:  Yes     Sherilyn Banker 03/05/2023, 11:43 AM
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
## 2023-03-06 ENCOUNTER — Inpatient Hospital Stay (HOSPITAL_COMMUNITY): Payer: Medicare PPO

## 2023-03-06 ENCOUNTER — Other Ambulatory Visit (HOSPITAL_COMMUNITY): Payer: Self-pay

## 2023-03-06 LAB — BUN: BUN: 20 mg/dL (ref 8–23)

## 2023-03-06 LAB — CREATININE, SERUM
Creatinine, Ser: 1.07 mg/dL (ref 0.61–1.24)
GFR, Estimated: >60 mL/min (ref >60)

## 2023-03-06 MED ORDER — LEVETIRACETAM 500 MG PO TABS
500.0000 mg | ORAL_TABLET | Freq: Two times a day (BID) | ORAL | 0 refills | Status: DC
  Filled 2023-03-06: qty 8, 4d supply, fill #0

## 2023-03-06 MED ORDER — POLYETHYLENE GLYCOL 3350 17 G PO PACK: 17.0000 g | PACK | Freq: Every day | ORAL | Status: DC | PRN

## 2023-03-06 MED ORDER — ACETAMINOPHEN 500 MG PO TABS: 1000.0000 mg | ORAL_TABLET | Freq: Three times a day (TID) | ORAL | Status: DC | PRN

## 2023-03-06 MED ORDER — BACITRACIN ZINC 500 UNIT/GM EX OINT: TOPICAL_OINTMENT | Freq: Every day | CUTANEOUS | 0 refills | Status: DC

## 2023-03-06 MED ORDER — BUTALBITAL-APAP-CAFFEINE 50-325-40 MG PO TABS
1.0000 | ORAL_TABLET | Freq: Four times a day (QID) | ORAL | Status: DC | PRN
  Administered 2023-03-06 – 2023-03-07 (×3): 1 via ORAL
  Filled 2023-03-06 (×3): qty 1

## 2023-03-06 MED ORDER — OXYCODONE HCL 5 MG PO TABS
2.5000 mg | ORAL_TABLET | ORAL | 0 refills | Status: DC | PRN
  Filled 2023-03-06: qty 10, 2d supply, fill #0

## 2023-03-06 MED ORDER — IOHEXOL 350 MG/ML SOLN
75.0000 mL | Freq: Once | INTRAVENOUS | Status: AC | PRN
  Administered 2023-03-06: 75 mL via INTRAVENOUS

## 2023-03-06 MED ORDER — BUTALBITAL-APAP-CAFFEINE 50-325-40 MG PO TABS
1.0000 | ORAL_TABLET | Freq: Four times a day (QID) | ORAL | 0 refills | Status: AC | PRN
  Filled 2023-03-06: qty 14, 4d supply, fill #0
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
## 2023-03-06 NOTE — Care Management (Addendum)
ED RN CM contact Rebekah RN concerning assisting with providing DME (rolling walker) for discharge home. She reported that patient will be here overnight as per Dr. Belinda Fisher for additional observation. Will update unit RN Case Manager to follow up in the am.

## 2023-03-06 NOTE — Progress Notes (Signed)
Pt continues to struggle with 10/10 headache. States he is unable to get up to leave. Andrey Campanile, PA. Followed new orders.
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
## 2023-03-06 NOTE — TOC Transition Note (Signed)
Transition of Care [facility]) - CM/SW Discharge Note   Patient Details  Name: [name] MRN: [id number] Date of Birth: 1934/03/14  Transition of Care [facility]) CM/SW Contact:  Glennon Mac, RN Phone Number: 03/06/2023, 3:44 PM   Clinical Narrative:    Patient medically stable for dc home today with family and [facility] services as previously arranged.  Notified [facility] of dc home today. No other dc needs identified.    Final next level of care: Home w Home Health Services Barriers to Discharge: Barriers Resolved   Patient Goals and CMS Choice CMS Medicare.gov Compare Post Acute Care list provided to:: Patient Choice offered to / list presented to : Patient, Spouse                          Discharge Plan and Services Additional resources added to the After Visit Summary for     Discharge Planning Services: CM Consult Post Acute Care Choice: Home Health                    HH Arranged: RN, PT, OT, Speech Therapy HH Agency: [facility] Date [facility] Agency Contacted: 03/05/23 Time HH Agency Contacted: 1200 Representative spoke with at [facility] Agency: Cindie Sillmon  Social Determinants of Health (SDOH) Interventions SDOH Screenings   Tobacco Use: Low Risk  (03/02/2023)     Readmission Risk Interventions     No data to display          Quintella Baton, RN, BSN  Trauma/Neuro ICU Case Manager [phone]
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
## 2023-03-06 NOTE — Progress Notes (Signed)
Discharge instructions reviewed with pt and his wife.  Copy of instructions given to pt's wife. [facility] filled scripts and will be delivered to pt prior to discharging, pt's wife has the OTC meds tylenol and miralax at home, bacitracin ointment sent home with pt that was in use here at hospital. Dressing supplies in room and sent home with pt.  Pt continues to have headache, pt given a dose of oxycodone at 1338. (Headache the same as this morning per pt and his wife--MD aware of headache today with a CT---see MD notes).  Pt to get dressed, a bit reluctant to dress at this time due to headache.     Once pt dressed, he will be d/c'd via wheelchair with belongings, with his wife.            Will be escorted by staff.   TOC meds delivered to unit and this RN will give to his wife.   OT on the unit, will go in to work with pt before discharge, she plans to assist with him getting dressed to go home as part of his therapy.  (OT staff person will go in to assist pt soon, pt wanted to wait a little bit longer for the pain med (oxycodone) to kick in before getting up to dress).  Pt's primary RN, Lupe Carney made aware of the above, plan to d/c pt out after OT works with pt and pt is dressed.   Harleigh Civello,RN SWOT

## 2023-03-06 NOTE — Progress Notes (Signed)
This RN took pt for walk to ensure stability considering pending discharge. Pt was unstable without walker, cross stepping, heel tipping and mildly disoriented to ability. Came across [place], PT and asked for a reassessment. See PT note. Case manager and Physician reached and on board with pt staying another night in order to secure DME for home.Care plan continues.

## 2023-03-06 NOTE — Progress Notes (Signed)
Pt was awakened from sleeping to reassess pain. Pt stated headache was gone.This RN recommended sitting up and getting ready to go as pt expressed want to discharge. Pt was very wobbly getting to restroom, furniture grabbing, shuffling, answered most questions with "I don't know". C/O intermittent headache. Pt appeared confused, couldn't tell me where he was or the month without stuttering delays and wrong answers. Tresa Endo, PA paged. Pt was walked back to bed to dangle at edge. Pt began to come around, eventually becoming alert and oriented again but still appearing groggy and lethargic. Plan of care continues
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
## 2023-03-07 NOTE — Plan of Care (Signed)
Patient is being discharged with wife and taking home a walker
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
## 2023-03-07 NOTE — TOC Transition Note (Signed)
Transition of Care [facility]) - CM/SW Discharge Note   Patient Details  Name: TALLEY SCHLUND MRN: [id number] Date of Birth: 05-01-1934  Transition of [facility] CM/SW Contact:  Glennon Mac, RN Phone Number: 03/07/2023, 0927am  Clinical Narrative:    Referral to [facility] for RW, to be delivered to bedside prior to dc.  Plan dc to home with wife and [facility] services as previously arranged.   Final next level of care: Home w Home Health Services Barriers to Discharge: Barriers Resolved   Patient Goals and CMS Choice CMS Medicare.gov Compare Post Acute Care list provided to:: Patient Choice offered to / list presented to : Patient, Spouse                        Discharge Plan and Services Additional resources added to the After Visit Summary for     Discharge Planning Services: CM Consult Post Acute Care Choice: Home Health                    HH Arranged: RN, PT, OT, Speech Therapy HH Agency: [facility] Date [facility] Agency Contacted: 03/05/23 Time HH Agency Contacted: 1200 Representative spoke with at [facility] Agency: Cindie Sillmon  Social Determinants of Health (SDOH) Interventions SDOH Screenings   Tobacco Use: Low Risk  (03/02/2023)     Readmission Risk Interventions     No data to display         Quintella Baton, RN, BSN  Trauma/Neuro ICU Case Manager [phone]

## 2023-03-08 ENCOUNTER — Encounter: Payer: Self-pay | Admitting: Cardiovascular Disease

## 2023-03-09 ENCOUNTER — Emergency Department
Admission: EM | Admit: 2023-03-09 | Discharge: 2023-03-09 | Disposition: A | Discharge status: Home / Self Care | Payer: Medicare PPO | Attending: Emergency Medicine | Admitting: Emergency Medicine

## 2023-03-09 ENCOUNTER — Emergency Department: Payer: Medicare PPO

## 2023-03-09 DIAGNOSIS — R519 Headache, unspecified: Secondary | ICD-10-CM | POA: Diagnosis not present

## 2023-03-09 MED ORDER — BUTALBITAL-APAP-CAFFEINE 50-325-40 MG PO TABS
2.0000 | ORAL_TABLET | Freq: Once | ORAL | Status: AC
  Administered 2023-03-09: 2 via ORAL
  Filled 2023-03-09: qty 2

## 2023-03-09 MED ORDER — PROCHLORPERAZINE EDISYLATE 10 MG/2ML IJ SOLN
10.0000 mg | Freq: Once | INTRAMUSCULAR | Status: AC
  Administered 2023-03-09: 10 mg via INTRAVENOUS
  Filled 2023-03-09: qty 2

## 2023-03-09 MED ORDER — FENTANYL CITRATE PF 50 MCG/ML IJ SOSY
50.0000 ug | PREFILLED_SYRINGE | Freq: Once | INTRAMUSCULAR | Status: AC
  Administered 2023-03-09: 50 ug via INTRAVENOUS
  Filled 2023-03-09: qty 1

## 2023-03-09 MED ORDER — LACTATED RINGERS IV BOLUS
1000.0000 mL | Freq: Once | INTRAVENOUS | Status: AC
  Administered 2023-03-09: 1000 mL via INTRAVENOUS

## 2023-03-09 MED ORDER — PROCHLORPERAZINE MALEATE 10 MG PO TABS: 10.0000 mg | ORAL_TABLET | Freq: Four times a day (QID) | ORAL | 0 refills | Status: DC | PRN

## 2023-03-09 MED ORDER — DIPHENHYDRAMINE HCL 50 MG/ML IJ SOLN
25.0000 mg | Freq: Once | INTRAMUSCULAR | Status: AC
  Administered 2023-03-09: 25 mg via INTRAVENOUS
  Filled 2023-03-09: qty 1
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
## 2023-03-09 NOTE — Discharge Instructions (Addendum)
We did another CT scan of his head and it did not show any more bleeding.  Continue to use the Fioricet and medications provided from [facility].  I also sent a prescription for Compazine.  This is a medicine that is often prescribed for nausea and vomiting, but can be very helpful for severe headaches.  You can also use over-the-counter Benadryl, this works well with Compazine for headaches.  25-50 mg (1-2 tablets) per dose
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
## 2023-03-25 ENCOUNTER — Other Ambulatory Visit: Payer: Self-pay | Admitting: Neurology

## 2023-03-25 DIAGNOSIS — S060X0S Concussion without loss of consciousness, sequela: Secondary | ICD-10-CM

## 2023-04-02 ENCOUNTER — Ambulatory Visit
Admission: RE | Admit: 2023-04-02 | Discharge: 2023-04-02 | Disposition: A | Discharge status: Home / Self Care | Payer: Medicare PPO | Source: Ambulatory Visit | Attending: Neurology | Admitting: Neurology

## 2023-04-02 DIAGNOSIS — S060X0S Concussion without loss of consciousness, sequela: Secondary | ICD-10-CM | POA: Insufficient documentation
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
## 2023-04-11 ENCOUNTER — Ambulatory Visit: Payer: Medicare PPO | Admitting: Neurosurgery
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
## 2023-04-22 ENCOUNTER — Encounter: Payer: Self-pay | Admitting: Neurosurgery

## 2023-04-22 ENCOUNTER — Ambulatory Visit: Payer: Medicare PPO | Admitting: Neurosurgery

## 2023-04-22 VITALS — BP 132/84 | Ht 70.0 in | Wt 166.0 lb

## 2023-04-22 DIAGNOSIS — S065XAA Traumatic subdural hemorrhage with loss of consciousness status unknown, initial encounter: Secondary | ICD-10-CM | POA: Diagnosis not present

## 2023-04-23 ENCOUNTER — Other Ambulatory Visit: Payer: Self-pay

## 2023-04-23 ENCOUNTER — Emergency Department: Payer: Medicare PPO

## 2023-04-23 ENCOUNTER — Encounter: Payer: Self-pay | Admitting: Emergency Medicine

## 2023-04-23 ENCOUNTER — Emergency Department
Admission: EM | Admit: 2023-04-23 | Discharge: 2023-04-24 | Disposition: A | Discharge status: Home / Self Care | Payer: Medicare PPO | Attending: Emergency Medicine | Admitting: Emergency Medicine

## 2023-04-23 DIAGNOSIS — S065XAD Traumatic subdural hemorrhage with loss of consciousness status unknown, subsequent encounter: Secondary | ICD-10-CM | POA: Diagnosis not present

## 2023-04-23 DIAGNOSIS — R4781 Slurred speech: Secondary | ICD-10-CM | POA: Insufficient documentation

## 2023-04-23 DIAGNOSIS — S065XAA Traumatic subdural hemorrhage with loss of consciousness status unknown, initial encounter: Secondary | ICD-10-CM

## 2023-04-23 DIAGNOSIS — R471 Dysarthria and anarthria: Secondary | ICD-10-CM | POA: Insufficient documentation

## 2023-04-23 DIAGNOSIS — I6203 Nontraumatic chronic subdural hemorrhage: Secondary | ICD-10-CM | POA: Insufficient documentation

## 2023-04-23 LAB — COMPREHENSIVE METABOLIC PANEL
ALT: 27 U/L (ref 0–44)
AST: 30 U/L (ref 15–41)
Albumin: 4.2 g/dL (ref 3.5–5.0)
Alkaline Phosphatase: 62 U/L (ref 38–126)
Anion gap: 7 (ref 5–15)
BUN: 19 mg/dL (ref 8–23)
CO2: 26 mmol/L (ref 22–32)
Calcium: 8.5 mg/dL — ABNORMAL LOW (ref 8.9–10.3)
Chloride: 108 mmol/L (ref 98–111)
Creatinine, Ser: 1.1 mg/dL (ref 0.61–1.24)
GFR, Estimated: >60 mL/min (ref >60)
Glucose, Bld: 148 mg/dL — ABNORMAL HIGH (ref 70–99)
Potassium: 3.6 mmol/L (ref 3.5–5.1)
Sodium: 141 mmol/L (ref 135–145)
Total Bilirubin: 0.6 mg/dL (ref 0.3–1.2)
Total Protein: 6.9 g/dL (ref 6.5–8.1)

## 2023-04-23 LAB — DIFFERENTIAL
Abs Immature Granulocytes: 0.03 10*3/uL (ref 0.00–0.07)
Basophils Absolute: 0 10*3/uL (ref 0.0–0.1)
Basophils Relative: 0 %
Eosinophils Absolute: 0.1 10*3/uL (ref 0.0–0.5)
Eosinophils Relative: 2 %
Immature Granulocytes: 1 %
Lymphocytes Relative: 46 %
Lymphs Abs: 2.3 10*3/uL (ref 0.7–4.0)
Monocytes Absolute: 0.4 10*3/uL (ref 0.1–1.0)
Monocytes Relative: 8 %
Neutro Abs: 2.2 10*3/uL (ref 1.7–7.7)
Neutrophils Relative %: 43 %
Smear Review: NORMAL

## 2023-04-23 LAB — ETHANOL: Alcohol, Ethyl (B): <10 mg/dL (ref <10)

## 2023-04-23 LAB — CBG MONITORING, ED: Glucose-Capillary: 123 mg/dL — ABNORMAL HIGH (ref 70–99)

## 2023-04-23 LAB — CBC
HCT: 44.5 % (ref 39.0–52.0)
Hemoglobin: 15.6 g/dL (ref 13.0–17.0)
MCH: 32.9 pg (ref 26.0–34.0)
MCHC: 35.1 g/dL (ref 30.0–36.0)
MCV: 93.9 fL (ref 80.0–100.0)
Platelets: 88 10*3/uL — ABNORMAL LOW (ref 150–400)
RBC: 4.74 MIL/uL (ref 4.22–5.81)
RDW: 13.5 % (ref 11.5–15.5)
WBC: 5 10*3/uL (ref 4.0–10.5)
nRBC: 0 % (ref 0.0–0.2)

## 2023-04-23 LAB — APTT: aPTT: 28 seconds (ref 24–36)

## 2023-04-23 LAB — PROTIME-INR
INR: 1 (ref 0.8–1.2)
Prothrombin Time: 13.5 seconds (ref 11.4–15.2)

## 2023-04-23 MED ORDER — LEVETIRACETAM IN NACL 1000 MG/100ML IV SOLN
1000.0000 mg | Freq: Once | INTRAVENOUS | Status: AC
  Administered 2023-04-23: 1000 mg via INTRAVENOUS
  Filled 2023-04-23: qty 100

## 2023-04-23 MED ORDER — SODIUM CHLORIDE 0.9% FLUSH
3.0000 mL | Freq: Once | INTRAVENOUS | Status: AC
  Administered 2023-04-23: 3 mL via INTRAVENOUS
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
## 2023-04-23 NOTE — ED Notes (Signed)
Code Stroke called to [facility] @ [facility]

## 2023-04-23 NOTE — ED Notes (Signed)
Stroke cart activated in CT

## 2023-04-23 NOTE — ED Notes (Signed)
Pt cleared for CT by Dr Roxan Hockey; pt taken via w/c

## 2023-04-23 NOTE — ED Triage Notes (Signed)
Patient ambulatory to triage with steady gait, without difficulty or distress noted; pt reports onset slurred speech and numbness to left side of tongue; pt A&Ox3, MAEW, grips = & strong bilat; st onset symptoms PTA, daughter st it was hr PTA, pt insists it has not been that long; charge nurse notified and code stroke activated

## 2023-04-23 NOTE — ED Notes (Signed)
Pt taken to room 26 via stretcher for further eval

## 2023-04-23 NOTE — ED Notes (Signed)
CBG 123 

## 2023-04-23 NOTE — ED Notes (Signed)
Pts wife reports at approx 2130 pt went in kitchen to heat up food and after bending over to pick something up pt had slurred speech and appeared to not be able to use tongue appropriately.

## 2023-04-23 NOTE — Progress Notes (Signed)
Code stroke timeline  2252 Code stroke cart activation, pt in CT at this time 2258 Pt returned to room from CT 2302 Neuro T[facility] paged 2305 Neuro [facility] on camera 2325 Neuro [facility] and TSRN off camera
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
## 2023-04-24 ENCOUNTER — Emergency Department: Payer: Medicare PPO

## 2023-04-24 DIAGNOSIS — R4781 Slurred speech: Secondary | ICD-10-CM | POA: Diagnosis not present

## 2023-04-24 DIAGNOSIS — S065XAD Traumatic subdural hemorrhage with loss of consciousness status unknown, subsequent encounter: Secondary | ICD-10-CM | POA: Diagnosis not present

## 2023-04-24 DIAGNOSIS — S065XAA Traumatic subdural hemorrhage with loss of consciousness status unknown, initial encounter: Secondary | ICD-10-CM

## 2023-04-24 MED ORDER — LEVETIRACETAM 500 MG PO TABS: 500.0000 mg | ORAL_TABLET | Freq: Two times a day (BID) | ORAL | 2 refills | Status: DC

## 2023-04-24 NOTE — ED Provider Notes (Signed)
-----------------------------------------   9:38 AM on 04/24/2023 -----------------------------------------   I took over care of this patient from Dr. Modesto Charon.  MRI shows no acute evidence of ischemia.  IMPRESSION:  1. Bilateral subdural hematomas. And evidence of Superficial  Siderosis.  Trace hemispheric SDH bilaterally with superimposed multiloculated  appearing right superior frontal convexity up to 18 mm hematoma. And  contralateral smaller and more uniform left superior convexity 4-5  mm hematoma.    2. No midline shift and only mild intracranial mass effect with  normal basilar cisterns.    3. 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 But no  acute or chronic ischemic disease identified in the brain.     I consulted and discussed the case with Dr. Katrinka Blazing from neurosurgery who evaluated the patient in the ED.  He advises that based on the CT and MRI findings, he does not recommend further monitoring in the hospital and advised that the patient is appropriate for discharge with outpatient follow-up from his perspective.    I also consulted and discussed the case with Dr. Iver Nestle from neurology.  She advises that if neurosurgery has cleared the patient she does not have any additional recommendations and does not feel that the patient needs further inpatient workup or monitoring from a neurology perspective.  Keppra has been prescribed as had been recommended by teleneurology overnight.  On reassessment, the patient states he is feeling well.  He states that his speech is "95%" back to normal and he has no other neurologic symptoms.  He feels comfortable and would like to go home.  I counseled him on the results of the workup and on the specialist recommendations.  I answered all of his questions.  I gave strict return precautions and he expresses understanding.   Dionne Bucy, MD 04/24/23 1426

## 2023-04-24 NOTE — ED Notes (Signed)
Assumed care of pt at this time. Pt is AAXO4, No needs identified at this time. Awaiting for 0500 CT scan

## 2023-04-24 NOTE — ED Notes (Signed)
Pt back from MRI 

## 2023-04-24 NOTE — ED Notes (Signed)
Patient transported to CT 

## 2023-04-24 NOTE — Discharge Instructions (Signed)
Fortunately your CT scan did not show any worsening of the bleed on repeat scanning in the emergency department    Dr. Katrinka Blazing will facilitate expedited follow-up as an outpatient; he will contact you to schedule an appointment.  Please take Keppra for seizure prevention.  Thank you for choosing [place] for your health care today!  Please see your primary doctor this week for a follow up appointment.   If you have any new, worsening, or unexpected symptoms call your doctor right away or come back to the emergency department for reevaluation.  It was my pleasure to care for you today.   Daneil Dan Modesto Charon, MD

## 2023-04-24 NOTE — Consult Note (Signed)
Consulting Department:  Emergency department  Primary Physician:  [name], MD  Chief Complaint: Subdural hematoma  History of Present Illness: 04/24/2023 [name] is a 89 y.o. male who presents with the chief complaint of subdural hematoma with an episode of slurred speech.  This patient I follow outpatient with a history of a subdural that slowly expanding.  He would like to continue with conservative care if possible.  He like to avoid surgery at any cost.  We did previously discussed possibility of MMA embolization which she was also hesitant.  Presented after episode of slurred speech.  He feels that he is gotten significantly better.  His headache has improved.  He was started on Keppra for concerns for possible seizure.  He obtained a CT scan which showed some expansion.   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, 12/26/10, 06/21/08, 04/20/06, 04/11/03, 01/05/03   Cancer (HCC)    melanoma   Cardiac arrhythmia    Cataract    Chicken pox    Colon polyp 12/29/10, 04/18/06, 01/05/03   Diplopia    Dysrhythmia    Essential hypertension    Exotropia, left eye    GERD (gastroesophageal reflux disease)    Gout    H/O: CVA (cerebrovascular accident)    Hemorrhoid    Hepatic cyst    History of kidney stones    Low testosterone    Melanoma in situ (HCC)    left ear, right cheek   Migraine    Near syncope    Nephrolithiasis    Nephrolithiasis    Occlusion and stenosis of vertebral artery    Psoriasis    Reflux    Splenomegaly    Strabismus    Subclavian steal syndrome    Syncope and collapse    Thrombocytopenia (HCC)    Ventricular premature depolarization    Vertebral artery stenosis     Past Surgical History: Past Surgical History:  Procedure Laterality  Date   APPENDECTOMY     COLONOSCOPY     COLONOSCOPY WITH PROPOFOL N/A 03/13/2016   Procedure: COLONOSCOPY WITH PROPOFOL;  Surgeon: Christena Deem, MD;  Location: [facility] ENDOSCOPY;  Service: Endoscopy;  Laterality: N/A;   diplopia     ESOPHAGOGASTRODUODENOSCOPY  08/2014, 06/14/2012   ESOPHAGOGASTRODUODENOSCOPY (EGD) WITH PROPOFOL N/A 03/13/2016   Procedure: ESOPHAGOGASTRODUODENOSCOPY (EGD) WITH PROPOFOL;  Surgeon: Christena Deem, MD;  Location: [facility] ENDOSCOPY;  Service: Endoscopy;  Laterality: N/A;   ESOPHAGOGASTRODUODENOSCOPY (EGD) WITH PROPOFOL N/A 07/15/2018   Procedure: ESOPHAGOGASTRODUODENOSCOPY (EGD) WITH PROPOFOL;  Surgeon: Christena Deem, MD;  Location: [facility] ENDOSCOPY;  Service: Endoscopy;  Laterality: N/A;   EYE SURGERY  08/16/2003   eye sug   MOHS SURGERY  2011, 2012   left ear and right cheek   STRABISMUS SURGERY  10/20/2015   2 horizontal muscles-left   TONSILLECTOMY     WISDOM TOOTH EXTRACTION      Allergies: Allergies as of 04/23/2023 - Review Complete 04/23/2023  Allergen Reaction Noted   Levofloxacin  03/01/2023   Other Diarrhea 03/02/2023    Medications: No current facility-administered medications for this encounter.  Current Outpatient Medications:    levETIRAcetam (KEPPRA) 500 MG tablet, Take 1 tablet (500 mg total) by mouth 2 (two) times daily., Disp: 60 tablet, Rfl: 2   Ascorbic Acid (  VITAMIN C PO), Take 1 tablet by mouth daily., Disp: , Rfl:    bacitracin ointment, Apply topically daily., Disp: 120 g, Rfl: 0   calcium-vitamin D (OSCAL WITH D) 500-200 MG-UNIT tablet, Take 1 tablet by mouth daily with breakfast., Disp: , Rfl:    clobetasol ointment (TEMOVATE) 0.05 %, Apply 1 Application topically daily., Disp: , Rfl:    desonide (DESOWEN) 0.05 % cream, Apply 1 Application topically 2 (two) times daily., Disp: , Rfl:    ezetimibe (ZETIA) 10 MG tablet, Take 1 tablet (10 mg total) by mouth daily., Disp: 90 tablet, Rfl: 3   finasteride (PROSCAR) 5 MG  tablet, Take 5 mg by mouth at bedtime., Disp: , Rfl:    folic acid (FOLVITE) 1 MG tablet, Take 3 mg by mouth daily., Disp: , Rfl:    ketoconazole (NIZORAL) 2 % shampoo, Apply 1 Application topically every other day., Disp: , Rfl:    methotrexate (RHEUMATREX) 2.5 MG tablet, Take 10 mg by mouth once a week., Disp: , Rfl:    Multiple Vitamin (MULTI-VITAMINS) TABS, Take by mouth daily. , Disp: , Rfl:    Multiple Vitamins-Minerals (MULTIVITAMIN WITH MINERALS) tablet, Take 1 tablet by mouth daily., Disp: , Rfl:    omeprazole (PRILOSEC) 20 MG capsule, Take 20 mg by mouth daily., Disp: , Rfl:    omeprazole (PRILOSEC) 20 MG capsule, Take 20 mg by mouth daily., Disp: , Rfl:    polyethylene glycol (MIRALAX / GLYCOLAX) 17 g packet, Take 17 g by mouth daily as needed (constipation)., Disp: , Rfl:    prochlorperazine (COMPAZINE) 10 MG tablet, Take 1 tablet (10 mg total) by mouth every 6 (six) hours as needed for nausea or vomiting., Disp: 30 tablet, Rfl: 0   silodosin (RAPAFLO) 8 MG CAPS capsule, Take 1 capsule (8 mg total) by mouth daily with breakfast., Disp: 90 capsule, Rfl: 3   Testosterone 1.62 % GEL, Apply 3 Pump topically daily., Disp: , Rfl:    valACYclovir (VALTREX) 1000 MG tablet, Take 500 mg by mouth daily., Disp: , Rfl:    Social History: Social History   Tobacco Use   Smoking status: Never   Smokeless tobacco: Never  Vaping Use   Vaping status: Never Used  Substance Use Topics   Alcohol use: Yes    Comment: social drinker   Drug use: Never    Family Medical History: Family History  Problem Relation Age of Onset   Heart failure Mother    Osteoporosis Mother    Stroke Mother    Heart attack Father    Gout Father    Diabetes Mellitus II Father    Renal Disease Father    Alcohol abuse Father    Osteoporosis Maternal Grandfather    Osteoporosis Maternal Grandmother     Physical Examination: Vitals:   04/24/23 0610 04/24/23 0954  BP: (!) 143/83 123/78  Pulse: 70 62  Resp: 15  13  Temp: 97.9 F (36.6 C) 97.7 F (36.5 C)  SpO2: 96% 98%     General: Patient is well developed, well nourished, calm, collected, and in no apparent distress.  NEUROLOGICAL:  General: In no acute distress.   Awake, alert, oriented to person, place, and time.  Pupils equal round and reactive to light.  Very mild facial asymmetry at rest that goes away with activation.  Tongue protrusion is midline.  Bilateral upper extremities are full strength proximally and distally.  T pronator drift is present only with slight pronation with outstretched arms and closed  eyes and no loss of height language is conversant.  GCS:15   Bilateral upper and lower extremity sensation is intact to light touch.  Imaging: Narrative & Impression  CLINICAL DATA:  89 year old male code stroke presentation with right side subdural hematoma increased from last month. Subsequent encounter.   EXAM: CT HEAD WITHOUT CONTRAST   TECHNIQUE: Contiguous axial images were obtained from the base of the skull through the vertex without intravenous contrast.   RADIATION DOSE REDUCTION: This exam was performed according to the departmental dose-optimization program which includes automated exposure control, adjustment of the mA and/or kV according to patient size and/or use of iterative reconstruction technique.   COMPARISON:  Head CT 04/23/2023 and earlier.   FINDINGS: Brain: Mixed density bilateral subdural hematomas. Larger, more lobulated and heterogeneous blood products along the right anterior frontal convexity measuring up to 17-18 mm maximum thickness is stable on series 3, image 17. Contralateral smaller and more uniform 4-5 mm low-density left subdural hematoma (coronal image 39), stable.   Mild intracranial mass effect with no midline shift. No intra-axial or intraventricular blood. No new intracranial hemorrhage. Basilar cisterns remain normal. No ventriculomegaly. Stable gray-white matter  differentiation throughout the brain. No cortically based acute infarct identified.   Vascular: No suspicious intracranial vascular hyperdensity.   Skull: Stable and intact.   Sinuses/Orbits: Visualized paranasal sinuses and mastoids are stable and well aerated.   Other: No acute orbit or scalp soft tissue finding.   IMPRESSION: 1. Stable right greater than left subdural hematomas since yesterday, larger and more mixed density on the Right more mixed density and more lobulated on the right (up to 18 mm). Smaller and more uniform 4-5 mm Left SDH. 2. Stable relatively mild intracranial mass effect with no midline shift. 3. No new intracranial abnormality.     Electronically Signed   By: Odessa Fleming M.D.   On: 04/24/2023 05:35      I have personally reviewed the images and agree with the above interpretation.  Labs:    Latest Ref Rng & Units 04/23/2023   11:05 PM 03/03/2023    3:34 AM 03/02/2023   10:45 AM  CBC  WBC 4.0 - 10.5 K/uL 5.0  9.1  4.3   Hemoglobin 13.0 - 17.0 g/dL 09.8  11.9  14.7   Hematocrit 39.0 - 52.0 % 44.5  44.7  47.7   Platelets 150 - 400 K/uL 88  89  91        Assessment and Plan: Mr. Theurer is a pleasant 89 y.o. male with a known subdural hematoma who has been following conservatively.  This has been slowly expanding over time, and he has some mild deficits associated with it including a very mild pronation with outstretched arms without loss of height.  His headache has improved however and he feels like his strength is improved as well.  He did have an episode of slurred speech which brought him to the emergency department.  He was started on antiepileptics by our neurology team.  CT scan was performed which demonstrated continued expansion.  We discussed with him in clinic and then reiterated today that he may need this drained, however he would like to have conservative therapy as long as possible to avoid any surgery.  We did talk about MMA embolization  which she was also hesitant to pursue.  Given the continued expansion I would like to see him again in the clinic to make sure that he has not rapidly decompensating.  Plan to  see him in approximately 2 weeks.    Lovenia Kim, MD/MSCR Dept. of Neurosurgery

## 2023-04-24 NOTE — ED Notes (Signed)
Pt ambulated to restroom and back to bed safely.

## 2023-04-25 ENCOUNTER — Telehealth: Payer: Self-pay | Admitting: Neurosurgery

## 2023-04-25 NOTE — Telephone Encounter (Signed)
Patient seen Dr.Smith on 8/19. Patient's wife Britta Mccreedy she is calling if maybe they should try to be seen sooner. Patient went to the ER on 8/20 and had a new CT and MRI. They were told that the hematoma was larger. They feel that 9/4 appt is too far out.

## 2023-04-29 NOTE — Telephone Encounter (Signed)
Spoke to patient's wife to change the up coming appointment. She states patient is currently at the [facility] and they are running test, the hematoma has gotten larger. Appointment has been cancelled.

## 2023-04-30 NOTE — Telephone Encounter (Signed)
Correction: Head CT was 04/24/23

## 2023-04-30 NOTE — Telephone Encounter (Signed)
You saw this patient in the office on 04/22/23 and ordered a Head CT, however he went to the ER on 04/23/23 and had a Head CT completed yesterday.    Do you still want him to have the CT you ordered, if so when would you like him to have it?

## 2023-05-01 NOTE — Telephone Encounter (Signed)
No he's getting his care at [facility]

## 2023-05-01 NOTE — Telephone Encounter (Signed)
Noted. I will cancel the order.

## 2023-05-08 ENCOUNTER — Telehealth: Payer: Medicare PPO | Admitting: Neurosurgery

## 2023-05-30 ENCOUNTER — Other Ambulatory Visit: Payer: Self-pay | Admitting: Neurosurgery

## 2023-05-30 DIAGNOSIS — S065XAA Traumatic subdural hemorrhage with loss of consciousness status unknown, initial encounter: Secondary | ICD-10-CM

## 2023-06-20 ENCOUNTER — Inpatient Hospital Stay: Payer: Medicare PPO | Admitting: Internal Medicine

## 2023-06-20 ENCOUNTER — Inpatient Hospital Stay: Payer: Medicare PPO | Attending: Internal Medicine

## 2023-06-20 ENCOUNTER — Encounter: Payer: Self-pay | Admitting: Internal Medicine

## 2023-06-20 VITALS — BP 146/82 | HR 78 | Temp 97.9°F | Ht 70.0 in | Wt 170.8 lb

## 2023-06-20 DIAGNOSIS — D696 Thrombocytopenia, unspecified: Secondary | ICD-10-CM

## 2023-06-20 DIAGNOSIS — Z79631 Long term (current) use of antimetabolite agent: Secondary | ICD-10-CM | POA: Insufficient documentation

## 2023-06-20 DIAGNOSIS — Z79899 Other long term (current) drug therapy: Secondary | ICD-10-CM | POA: Insufficient documentation

## 2023-06-20 DIAGNOSIS — L409 Psoriasis, unspecified: Secondary | ICD-10-CM | POA: Insufficient documentation

## 2023-06-20 DIAGNOSIS — Z79624 Long term (current) use of inhibitors of nucleotide synthesis: Secondary | ICD-10-CM | POA: Diagnosis not present

## 2023-06-20 LAB — COMPREHENSIVE METABOLIC PANEL
ALT: 32 U/L (ref 0–44)
AST: 30 U/L (ref 15–41)
Albumin: 4.4 g/dL (ref 3.5–5.0)
Alkaline Phosphatase: 54 U/L (ref 38–126)
Anion gap: 6 (ref 5–15)
BUN: 17 mg/dL (ref 8–23)
CO2: 28 mmol/L (ref 22–32)
Calcium: 8.7 mg/dL — ABNORMAL LOW (ref 8.9–10.3)
Chloride: 107 mmol/L (ref 98–111)
Creatinine, Ser: 1.02 mg/dL (ref 0.61–1.24)
GFR, Estimated: >60 mL/min (ref >60)
Glucose, Bld: 110 mg/dL — ABNORMAL HIGH (ref 70–99)
Potassium: 3.9 mmol/L (ref 3.5–5.1)
Sodium: 141 mmol/L (ref 135–145)
Total Bilirubin: 1.2 mg/dL (ref 0.3–1.2)
Total Protein: 7.1 g/dL (ref 6.5–8.1)

## 2023-06-20 LAB — CBC WITH DIFFERENTIAL/PLATELET
Abs Immature Granulocytes: 0.01 10*3/uL (ref 0.00–0.07)
Basophils Absolute: 0 10*3/uL (ref 0.0–0.1)
Basophils Relative: 1 %
Eosinophils Absolute: 0.1 10*3/uL (ref 0.0–0.5)
Eosinophils Relative: 3 %
HCT: 44.5 % (ref 39.0–52.0)
Hemoglobin: 15.3 g/dL (ref 13.0–17.0)
Immature Granulocytes: 0 %
Lymphocytes Relative: 43 %
Lymphs Abs: 1.7 10*3/uL (ref 0.7–4.0)
MCH: 32.4 pg (ref 26.0–34.0)
MCHC: 34.4 g/dL (ref 30.0–36.0)
MCV: 94.3 fL (ref 80.0–100.0)
Monocytes Absolute: 0.3 10*3/uL (ref 0.1–1.0)
Monocytes Relative: 7 %
Neutro Abs: 1.9 10*3/uL (ref 1.7–7.7)
Neutrophils Relative %: 46 %
Platelets: 89 10*3/uL — ABNORMAL LOW (ref 150–400)
RBC: 4.72 MIL/uL (ref 4.22–5.81)
RDW: 14 % (ref 11.5–15.5)
WBC: 4.1 10*3/uL (ref 4.0–10.5)
nRBC: 0 % (ref 0.0–0.2)

## 2023-06-20 LAB — LACTATE DEHYDROGENASE: LDH: 161 U/L (ref 98–192)

## 2023-06-20 MED ORDER — DEXAMETHASONE 4 MG PO TABS: ORAL_TABLET | ORAL | 0 refills | Status: DC
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
## 2023-06-20 NOTE — Addendum Note (Signed)
Addended by: Darrold Span A on: 06/20/2023 11:09 AM   Modules accepted: Orders

## 2023-06-20 NOTE — Assessment & Plan Note (Addendum)
#   Chronic thrombocytopenia isolated- > 100 [since 2015]-Clinically suspicious of ITP [although mild splenomegaly]; less likely from alcohol/MDS.  No bone marrow biopsy.  # Currently on surveillance; however- given recent fall/subdural hematoma [see below]- platelets- 89- I would recommend a trial of steroids- dex 20 mg daily x5. Goal platelets > 100; However if not steroid responsive/ not improved above > 100 consider promacta 50 mg/day.  Also consider bone marrow biopsy if platelets not responsive to steroids/Promacta.  Discussed the potential side effects including but not limited to delirium, hyperactivity, increased blood sugar.  Long-term complications also discussed including but not limited to myopathy, thrush.   # subdural hematoma-right meningeal artery embolization [needing- platelet transfusion at 80s in [facility]- Aug 2024; Dr.Cooke/Dr.Shah] -CT September 2024 stable.  We will proceed with steroids as above-with the goal of about 100.  # Psoriasis- on MXT- STABLE.  # DISPOSITION:  # follow up in 2 weeks- APP- labs- cbc; # follow up in 4 weeks-- MD/labs-cbc/cmp/ldh- Dr.B  Cc; Dr.Bronstein

## 2023-07-04 ENCOUNTER — Telehealth: Payer: Self-pay | Admitting: Pharmacist

## 2023-07-04 ENCOUNTER — Encounter: Payer: Self-pay | Admitting: Nurse Practitioner

## 2023-07-04 ENCOUNTER — Other Ambulatory Visit (HOSPITAL_COMMUNITY): Payer: Self-pay

## 2023-07-04 ENCOUNTER — Inpatient Hospital Stay (HOSPITAL_BASED_OUTPATIENT_CLINIC_OR_DEPARTMENT_OTHER): Payer: Medicare PPO | Admitting: Nurse Practitioner

## 2023-07-04 ENCOUNTER — Telehealth: Payer: Self-pay

## 2023-07-04 ENCOUNTER — Inpatient Hospital Stay: Payer: Medicare PPO

## 2023-07-04 VITALS — BP 131/80 | HR 94 | Temp 98.6°F | Resp 19 | Wt 168.2 lb

## 2023-07-04 DIAGNOSIS — D696 Thrombocytopenia, unspecified: Secondary | ICD-10-CM

## 2023-07-04 LAB — CBC WITH DIFFERENTIAL (CANCER CENTER ONLY)
Abs Immature Granulocytes: 0.04 10*3/uL (ref 0.00–0.07)
Basophils Absolute: 0 10*3/uL (ref 0.0–0.1)
Basophils Relative: 0 %
Eosinophils Absolute: 0.1 10*3/uL (ref 0.0–0.5)
Eosinophils Relative: 2 %
HCT: 45.7 % (ref 39.0–52.0)
Hemoglobin: 15.8 g/dL (ref 13.0–17.0)
Immature Granulocytes: 1 %
Lymphocytes Relative: 36 %
Lymphs Abs: 2.5 10*3/uL (ref 0.7–4.0)
MCH: 33.3 pg (ref 26.0–34.0)
MCHC: 34.6 g/dL (ref 30.0–36.0)
MCV: 96.2 fL (ref 80.0–100.0)
Monocytes Absolute: 0.4 10*3/uL (ref 0.1–1.0)
Monocytes Relative: 6 %
Neutro Abs: 3.9 10*3/uL (ref 1.7–7.7)
Neutrophils Relative %: 55 %
Platelet Count: 69 10*3/uL — ABNORMAL LOW (ref 150–400)
RBC: 4.75 MIL/uL (ref 4.22–5.81)
RDW: 14.4 % (ref 11.5–15.5)
WBC Count: 6.9 10*3/uL (ref 4.0–10.5)
nRBC: 0 % (ref 0.0–0.2)

## 2023-07-04 NOTE — Progress Notes (Signed)
Patient has no concerns at the moment. 

## 2023-07-04 NOTE — Telephone Encounter (Signed)
Oral Oncology Patient Advocate Encounter  New authorization   Received notification that prior authorization for Promacta is required.   PA submitted on 07/04/23  Key [id number]  Status is pending     Ardeen Fillers, CPhT Oncology Pharmacy Patient Advocate  [facility]  [phone] (phone) [phone] (fax) 07/04/2023 3:57 PM

## 2023-07-04 NOTE — Progress Notes (Signed)
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  Alone.  [name] 89 y.o.  male here for follow-up because of his thrombocytopenia.   Patient had a syncope while cycling fall in June 2024. Patient had a subdural hematoma.  Patient had work up [facility].  Patient was evaluated by [facility] neurosurgery and required right meningeal artery embolization.  Patient also follows up with [facility] neurology.  Patient not on any blood thinners.   In interim, his platelet count dropped and trial of steroids was recommended. He tolerated well and returns to clinic for follow up. He denies any bleeding episodes, stool changes, melena stools or constipation. He does notice easy bruising and bleeding. No other pains. He continues to be physically active.    Review of Systems  Constitutional:  Negative for chills, diaphoresis, fever, malaise/fatigue and weight loss.  HENT:  Negative for nosebleeds and sore throat.   Eyes:  Negative for double vision.  Respiratory:  Negative for cough, hemoptysis, sputum production, shortness of breath and wheezing.   Cardiovascular:  Negative for chest pain, palpitations, orthopnea and leg swelling.  Gastrointestinal:  Negative for abdominal pain, blood in stool, constipation, diarrhea, heartburn, melena, nausea and vomiting.  Genitourinary:   Negative for dysuria, frequency and urgency.  Musculoskeletal:  Negative for back pain and joint pain.  Skin: Negative.  Negative for itching and rash.  Neurological:  Negative for dizziness, tingling, focal weakness, weakness and headaches.  Endo/Heme/Allergies:  Does not bruise/bleed easily.  Psychiatric/Behavioral:  Negative for depression. The patient is not nervous/anxious and does not have insomnia.     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, 12/26/10, 06/21/08, 04/20/06, 04/11/03, 01/05/03   Cancer (HCC)    melanoma   Cardiac arrhythmia    Cataract    Chicken pox    Colon polyp 12/29/10, 04/18/06, 01/05/03   Diplopia    Dysrhythmia    Essential hypertension    Exotropia, left eye    GERD (gastroesophageal reflux disease)    Gout    H/O: CVA (cerebrovascular accident)    Hemorrhoid    Hepatic cyst    History of kidney stones    Low testosterone    Melanoma in situ (HCC)    left ear, right cheek   Migraine    Near syncope    Nephrolithiasis    Nephrolithiasis    Occlusion and stenosis of vertebral artery    Psoriasis    Reflux    Splenomegaly    Strabismus    Subclavian steal syndrome    Syncope and collapse    Thrombocytopenia (HCC)    Ventricular premature depolarization    Vertebral artery stenosis     SURGICAL HISTORY: Past Surgical History:  Procedure Laterality Date   APPENDECTOMY     COLONOSCOPY     COLONOSCOPY WITH PROPOFOL N/A 03/13/2016   Procedure: COLONOSCOPY WITH PROPOFOL;  Surgeon:  Christena Deem, MD;  Location[facility] ENDOSCOPY;  Service: Endoscopy;  Laterality: N/A;   diplopia     ESOPHAGOGASTRODUODENOSCOPY  08/2014, 06/14/2012   ESOPHAGOGASTRODUODENOSCOPY (EGD) WITH PROPOFOL N/A 03/13/2016   Procedure: ESOPHAGOGASTRODUODENOSCOPY (EGD) WITH PROPOFOL;  Surgeon: Christena Deem, MD;  Location: [facility] ENDOSCOPY;  Service: Endoscopy;   Laterality: N/A;   ESOPHAGOGASTRODUODENOSCOPY (EGD) WITH PROPOFOL N/A 07/15/2018   Procedure: ESOPHAGOGASTRODUODENOSCOPY (EGD) WITH PROPOFOL;  Surgeon: Christena Deem, MD;  Location: [facility] ENDOSCOPY;  Service: Endoscopy;  Laterality: N/A;   EYE SURGERY  08/16/2003   eye sug   MOHS SURGERY  2011, 2012   left ear and right cheek   STRABISMUS SURGERY  10/20/2015   2 horizontal muscles-left   TONSILLECTOMY     WISDOM TOOTH EXTRACTION      SOCIAL HISTORY: Social History   Socioeconomic History   Marital status: Married    Spouse name: Not on file   Number of children: Not on file   Years of education: Not on file   Highest education level: Not on file  Occupational History   Not on file  Tobacco Use   Smoking status: Never   Smokeless tobacco: Never  Vaping Use   Vaping status: Never Used  Substance and Sexual Activity   Alcohol use: Yes    Comment: social drinker   Drug use: Never   Sexual activity: Yes  Other Topics Concern   Not on file  Social History Narrative   ** Merged History Encounter **        Lives in [place]; no smoking; 4-5 cocktails a week; 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   Social Determinants of Corporate investment banker Strain: Patient Declined (05/03/2023)   Received from [facility]   Overall Financial Resource Strain (CARDIA)    Difficulty of Paying Living Expenses: Patient declined  Recent Concern: Physicist, medical Strain - High Risk (04/28/2023)   Received from [facility]   Overall Financial Resource Strain (CARDIA)    Difficulty of Paying Living Expenses: Very hard  Food Insecurity: Patient Declined (05/03/2023)   Received from [facility]   Hunger Vital Sign    Worried About Running Out of Food in the Last Year: Patient declined    Ran Out of Food in the Last Year: Patient declined  Transportation Needs: Patient Declined (05/03/2023)   Received from [facility]   PRAPARE - Transportation    In the past 12 months, has lack of transportation kept you from medical appointments or from getting medications?: Patient declined    Lack of Transportation (Non-Medical): Patient declined  Physical Activity: Not on file  Stress: Not on file  Social Connections: Not on file  Intimate Partner Violence: Not on file    FAMILY HISTORY: Family History  Problem Relation Age of Onset   Heart failure Mother    Osteoporosis Mother    Stroke Mother    Heart attack Father    Gout Father    Diabetes Mellitus II Father    Renal Disease Father    Alcohol abuse Father    Osteoporosis Maternal Grandfather    Osteoporosis Maternal Grandmother     ALLERGIES:  is allergic to levofloxacin and other.  MEDICATIONS:  Current Outpatient Medications  Medication Sig Dispense Refill   clobetasol ointment (TEMOVATE) 0.05 % Apply 1 Application topically daily.     ezetimibe (ZETIA) 10 MG tablet Take 1 tablet (10 mg  total) by mouth daily. 90 tablet 3   finasteride (PROSCAR) 5 MG tablet Take 5 mg by mouth at bedtime.     folic acid (FOLVITE) 1 MG tablet Take 3 mg by mouth daily.     ketoconazole (NIZORAL) 2 % shampoo Apply 1 Application topically every other day.     methotrexate (RHEUMATREX) 2.5 MG tablet Take 10 mg by mouth once a week.     Multiple Vitamin (MULTI-VITAMINS) TABS Take by mouth daily.      Multiple Vitamins-Minerals (MULTIVITAMIN WITH MINERALS) tablet Take 1 tablet by mouth daily.     omeprazole (PRILOSEC) 20 MG capsule Take 20 mg by mouth daily.     silodosin (RAPAFLO) 8 MG CAPS capsule Take 1 capsule (8 mg total) by mouth daily with breakfast. 90 capsule 3   Testosterone 1.62 % GEL Apply 3 Pump topically daily.     valACYclovir (VALTREX) 1000 MG tablet Take 500 mg by mouth daily.     Ascorbic Acid (VITAMIN C PO) Take 1 tablet by mouth daily.     calcium-vitamin D (OSCAL WITH D) 500-200 MG-UNIT tablet Take 1 tablet by mouth daily  with breakfast.     desonide (DESOWEN) 0.05 % cream Apply 1 Application topically 2 (two) times daily.     dexamethasone (DECADRON) 4 MG tablet Take 5 pills daily in the morning; for 5 days. 25 tablet 0   No current facility-administered medications for this visit.     PHYSICAL EXAMINATION: Vitals:   07/04/23 1528  BP: 131/80  Pulse: 94  Resp: 19  Temp: 98.6 F (37 C)  SpO2: 99%   Filed Weights   07/04/23 1528  Weight: 168 lb 3.2 oz (76.3 kg)   Physical Exam Vitals reviewed.  Constitutional:      Appearance: He is not ill-appearing.  HENT:     Head: Normocephalic and atraumatic.  Cardiovascular:     Rate and Rhythm: Normal rate and regular rhythm.  Pulmonary:     Effort: No respiratory distress.     Breath sounds: No wheezing.  Abdominal:     General: There is no distension.     Palpations: Abdomen is soft.     Tenderness: There is no abdominal tenderness. There is no guarding.  Musculoskeletal:        General: No tenderness.  Skin:    General: Skin is warm.     Coloration: Skin is not pale.     Findings: Bruising present.  Neurological:     Mental Status: He is alert and oriented to person, place, and time.  Psychiatric:        Mood and Affect: Mood and affect normal.        Behavior: Behavior normal.    LABORATORY DATA:  I have reviewed the data as listed Lab Results  Component Value Date   WBC 6.9 07/04/2023   HGB 15.8 07/04/2023   HCT 45.7 07/04/2023   MCV 96.2 07/04/2023   PLT 69 (L) 07/04/2023   Recent Labs    03/02/23 1045 03/03/23 0334 03/06/23 1042 04/23/23 2305 06/20/23 0830  NA 140 139  --  141 141  K 4.0 4.0  --  3.6 3.9  CL 107 106  --  108 107  CO2 23 24  --  26 28  GLUCOSE 159* 135*  --  148* 110*  BUN 19 26* 20 19 17   CREATININE 1.41* 1.44* 1.07 1.10 1.02  CALCIUM 8.8* 8.5*  --  8.5* 8.7*  GFRNONAA 48*  47* >60 >60 >60  PROT 6.2*  --   --  6.9 7.1  ALBUMIN 4.1  --   --  4.2 4.4  AST 41  --   --  30 30  ALT 32  --   --  27  32  ALKPHOS 54  --   --  62 54  BILITOT 1.2  --   --  0.6 1.2     ASSESSMENT & PLAN:   # Chronic thrombocytopenia isolated- > 100 [since 2015]- Clinically suspicious of ITP [although mild splenomegaly]; less likely from alcohol/MDS.  No bone marrow biopsy.   # Currently on surveillance; however- given recent fall/subdural hematoma [see below]- platelets- 89. No response to dex 20 mg daily x 5. Platelets are worse. Now 69. Recommend starting promacta 50 mg/day. Awaiting authorization/approval. If no response to steroids & promacta would recommend bone marrow. Goal platelet count > 100. Reviewed bleeding risks d/t thrombocotpenia. Currently asymptomatic.    # subdural hematoma-right meningeal artery embolization [needing- platelet transfusion at 80s in [facility]- Aug 2024; Dr.Cooke/Dr.Shah] -CT September 2024 stable.  We will proceed with steroids as above-with the goal of about 100.   # Psoriasis- on MXT- STABLE.   # DISPOSITION:  Follow up as scheduled in 2 weeks- la    No problem-specific Assessment & Plan notes found for this encounter.   No diagnosis found.    Alinda Dooms, NP 07/04/2023 3:40 PM

## 2023-07-04 NOTE — Telephone Encounter (Signed)
Clinical Pharmacist Practitioner Encounter   Received new prescription for Promacta (eltrombopag) for the treatment of ITP, planned duration until disease progression or unacceptable drug toxicity.  CMP from 06/20/23 assessed, no relevant lab abnormalities. Prescription dose and frequency assessed.   Current medication list in Epic reviewed, several DDIs with eltrombopag identified: Calcium/vit-D and multivitamin: Polyvalent Cation Containing Products may decrease the serum concentration of Eltrombopag. Administer eltrombopag at least 2 hours before or 4 hours after oral administration of any polyvalent cation containing product. Ezetimibe: Eltrombopag may increase the serum concentration of Ezetimibe. Monitor patient closely for adverse effects related to Ezetimibe.  Methotrexate: Eltrombopag may increase the serum concentration of Methotrexate. Monitor patient closely for adverse effects related to Methotrexate.  Evaluated chart and no patient barriers to medication adherence identified.   Prescription has been e-scribed to the [facility] for benefits analysis and approval.  Oral Oncology Clinic will continue to follow for insurance authorization, copayment issues, initial counseling and start date.   Remi Haggard, PharmD, BCPS, BCOP, CPP Hematology/Oncology Clinical Pharmacist Practitioner /[facility] [phone]  07/04/2023 3:57 PM

## 2023-07-05 ENCOUNTER — Telehealth: Payer: Self-pay

## 2023-07-05 ENCOUNTER — Other Ambulatory Visit (HOSPITAL_COMMUNITY): Payer: Self-pay

## 2023-07-05 NOTE — Telephone Encounter (Signed)
Oral Oncology Patient Advocate Encounter  Received notification that the request for prior authorization for Promacta has been denied due to patient's platelet count is not less than 50,000/ mcL.      Ardeen Fillers, CPhT Oncology Pharmacy Patient Advocate  [facility]  [phone] (phone) [phone] (fax) 07/05/2023 8:48 AM

## 2023-07-05 NOTE — Telephone Encounter (Signed)
Oral Oncology Patient Advocate Encounter   Began application for assistance for Promacta through [employer].   Application will be submitted upon completion of necessary supporting documentation.   NPAF's phone number [phone].   I will continue to check the status until final determination.    Ardeen Fillers, CPhT Oncology Pharmacy Patient Advocate  [facility]  [phone] (phone) [phone] (fax) 07/05/2023 12:40 PM

## 2023-07-05 NOTE — Telephone Encounter (Signed)
Called and spoke with patient's wife, Britta Mccreedy. She will have patient give me a call to set up a time to meet in office next week to obtain signature and drop off Proof of Income for submission to [employer]. I will continue to follow and update until final determination.    Ardeen Fillers, CPhT Oncology Pharmacy Patient Advocate  [facility]  [phone] (phone) [phone] (fax) 07/05/2023 1:08 PM

## 2023-07-08 NOTE — Telephone Encounter (Signed)
Expedited appeal sent to [facility] on 07/08/23.

## 2023-07-09 ENCOUNTER — Other Ambulatory Visit (HOSPITAL_COMMUNITY): Payer: Self-pay

## 2023-07-09 NOTE — Telephone Encounter (Addendum)
Oral Oncology Patient Advocate Encounter  Prior Authorization for [name] has been approved.    PA# [id number]  Effective dates: 07/09/23 through 01/06/24  Patients co-pay is $100.00.   PAP initiated. See additional encounter.    Ardeen Fillers, CPhT Oncology Pharmacy Patient Advocate  [facility]  [phone] (phone) [phone] (fax) 07/09/2023 10:32 AM

## 2023-07-10 NOTE — Telephone Encounter (Signed)
Patient returned my call and would like to meet in office to sign application and drop off proof of income. I will meet patient in clinic Thursday, 07/11/23, to obtain signature and financial documents. I will submit once in hand. I will continue to follow and update until final determination.    Ardeen Fillers, CPhT Oncology Pharmacy Patient Advocate  [facility]  [phone] (phone) [phone] (fax) 07/10/2023 3:19 PM

## 2023-07-15 ENCOUNTER — Ambulatory Visit
Admission: RE | Admit: 2023-07-15 | Discharge: 2023-07-15 | Disposition: A | Discharge status: Home / Self Care | Payer: Medicare PPO | Source: Ambulatory Visit | Attending: Neurosurgery | Admitting: Neurosurgery

## 2023-07-15 DIAGNOSIS — S065XAA Traumatic subdural hemorrhage with loss of consciousness status unknown, initial encounter: Secondary | ICD-10-CM | POA: Insufficient documentation

## 2023-07-18 NOTE — Telephone Encounter (Signed)
Called and spoke to patient's wife, [name], in regards to patient bringing in financial documents to send off to [employer]. Patient's wife indicated that they make well above the income limit for the program and that they would be okay paying the $100 co-pay for Promacta. I will set up shipment from [facility] to patient's home address when I see him in clinic tomorrow 07/19/23.    Ardeen Fillers, CPhT Oncology Pharmacy Patient Advocate  [facility]  [phone] (phone) [phone] (fax) 07/18/2023 8:37 AM

## 2023-07-19 ENCOUNTER — Inpatient Hospital Stay: Payer: Medicare PPO | Attending: Internal Medicine | Admitting: Internal Medicine

## 2023-07-19 ENCOUNTER — Other Ambulatory Visit: Payer: Self-pay

## 2023-07-19 ENCOUNTER — Inpatient Hospital Stay: Payer: Medicare PPO | Admitting: Pharmacist

## 2023-07-19 ENCOUNTER — Inpatient Hospital Stay: Payer: Medicare PPO

## 2023-07-19 ENCOUNTER — Encounter: Payer: Self-pay | Admitting: Internal Medicine

## 2023-07-19 VITALS — BP 144/80 | HR 73 | Temp 97.8°F | Ht 70.0 in | Wt 172.0 lb

## 2023-07-19 DIAGNOSIS — Z79624 Long term (current) use of inhibitors of nucleotide synthesis: Secondary | ICD-10-CM | POA: Diagnosis not present

## 2023-07-19 DIAGNOSIS — D696 Thrombocytopenia, unspecified: Secondary | ICD-10-CM

## 2023-07-19 DIAGNOSIS — Z79631 Long term (current) use of antimetabolite agent: Secondary | ICD-10-CM | POA: Insufficient documentation

## 2023-07-19 DIAGNOSIS — Z79899 Other long term (current) drug therapy: Secondary | ICD-10-CM | POA: Diagnosis not present

## 2023-07-19 LAB — CBC WITH DIFFERENTIAL (CANCER CENTER ONLY)
Abs Immature Granulocytes: 0.04 10*3/uL (ref 0.00–0.07)
Basophils Absolute: 0 10*3/uL (ref 0.0–0.1)
Basophils Relative: 1 %
Eosinophils Absolute: 0.2 10*3/uL (ref 0.0–0.5)
Eosinophils Relative: 5 %
HCT: 46.8 % (ref 39.0–52.0)
Hemoglobin: 15.8 g/dL (ref 13.0–17.0)
Immature Granulocytes: 1 %
Lymphocytes Relative: 39 %
Lymphs Abs: 1.6 10*3/uL (ref 0.7–4.0)
MCH: 32.7 pg (ref 26.0–34.0)
MCHC: 33.8 g/dL (ref 30.0–36.0)
MCV: 96.9 fL (ref 80.0–100.0)
Monocytes Absolute: 0.4 10*3/uL (ref 0.1–1.0)
Monocytes Relative: 9 %
Neutro Abs: 1.9 10*3/uL (ref 1.7–7.7)
Neutrophils Relative %: 45 %
Platelet Count: 87 10*3/uL — ABNORMAL LOW (ref 150–400)
RBC: 4.83 MIL/uL (ref 4.22–5.81)
RDW: 14.1 % (ref 11.5–15.5)
WBC Count: 4.2 10*3/uL (ref 4.0–10.5)
nRBC: 0 % (ref 0.0–0.2)

## 2023-07-19 LAB — CMP (CANCER CENTER ONLY)
ALT: 29 U/L (ref 0–44)
AST: 29 U/L (ref 15–41)
Albumin: 4.2 g/dL (ref 3.5–5.0)
Alkaline Phosphatase: 67 U/L (ref 38–126)
Anion gap: 9 (ref 5–15)
BUN: 17 mg/dL (ref 8–23)
CO2: 26 mmol/L (ref 22–32)
Calcium: 8.8 mg/dL — ABNORMAL LOW (ref 8.9–10.3)
Chloride: 107 mmol/L (ref 98–111)
Creatinine: 1.02 mg/dL (ref 0.61–1.24)
GFR, Estimated: >60 mL/min (ref >60)
Glucose, Bld: 144 mg/dL — ABNORMAL HIGH (ref 70–99)
Potassium: 3.8 mmol/L (ref 3.5–5.1)
Sodium: 142 mmol/L (ref 135–145)
Total Bilirubin: 0.8 mg/dL (ref <1.2)
Total Protein: 6.6 g/dL (ref 6.5–8.1)

## 2023-07-19 LAB — LACTATE DEHYDROGENASE: LDH: 183 U/L (ref 98–192)

## 2023-07-19 MED ORDER — ELTROMBOPAG OLAMINE 50 MG PO TABS
50.0000 mg | ORAL_TABLET | Freq: Every day | ORAL | 1 refills | Status: DC
  Filled 2023-07-19: qty 30, 30d supply, fill #0
  Filled 2023-08-15 – 2023-09-02 (×2): qty 30, 30d supply, fill #1

## 2023-07-19 NOTE — Progress Notes (Signed)
[facility] CONSULT NOTE  Patient Care Team: [name], MD as PCP - General (Family Medicine) Mariah Milling Tollie Pizza, MD as Consulting Physician (Cardiology) [name], MD (Family Medicine) Earna Coder, MD as Consulting Physician (Oncology)  CHIEF COMPLAINTS/PURPOSE OF CONSULTATION: Thrombocytopenia  # 2013- CHRONIC ISOLATED THROMBOCYTOPENIA- 130-99; May 2017- 115 [place]- 2015- MILD splenomegaly; liver Elastography- Dr.Skulskie-N/ no cirrhosis; hepatitis B/C- NEG; Monoclonal work up-NEG  # On testosterone supplementation; 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  Alone.  [name] 89 y.o.  male here for follow-up because of his thrombocytopenia; s/p  with subdural hematoma [s/p fall]. Is here for a follow up.  Patient had a recent CT scan brain for follow up. Denies any head aches.   Patient s/p Dex- no improvement noted.  He denies any bleeding episodes, stool changes, melena stools or constipation.  No other pains. He continues to be physically active, but no cycling sec to recent fall.    Review of Systems  Constitutional:  Negative for chills, diaphoresis, fever, malaise/fatigue and weight loss.  HENT:  Negative for nosebleeds and sore throat.   Eyes:  Negative for double vision.  Respiratory:  Negative for cough, hemoptysis, sputum production, shortness of breath and wheezing.   Cardiovascular:  Negative for chest pain, palpitations, orthopnea and leg swelling.  Gastrointestinal:  Negative for abdominal pain, blood in stool, constipation, diarrhea, heartburn, melena, nausea and vomiting.  Genitourinary:  Negative for dysuria, frequency and urgency.  Musculoskeletal:  Negative for back pain and joint pain.  Skin: Negative.  Negative for itching and rash.  Neurological:  Negative for dizziness, tingling, focal weakness, weakness and headaches.  Endo/Heme/Allergies:  Does not  bruise/bleed easily.  Psychiatric/Behavioral:  Negative for depression. The patient is not nervous/anxious and does not have insomnia.      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, 12/26/10, 06/21/08, 04/20/06, 04/11/03, 01/05/03   Cancer (HCC)    melanoma   Cardiac arrhythmia    Cataract    Chicken pox    Colon polyp 12/29/10, 04/18/06, 01/05/03   Diplopia    Dysrhythmia    Essential hypertension    Exotropia, left eye    GERD (gastroesophageal reflux disease)    Gout    H/O: CVA (cerebrovascular accident)    Hemorrhoid    Hepatic cyst    History of kidney stones    Low testosterone    Melanoma in situ (HCC)    left ear, right cheek   Migraine    Near syncope    Nephrolithiasis    Nephrolithiasis    Occlusion and stenosis of vertebral artery    Psoriasis    Reflux    Splenomegaly    Strabismus    Subclavian steal syndrome    Syncope and collapse    Thrombocytopenia (HCC)    Ventricular premature depolarization    Vertebral artery stenosis     SURGICAL HISTORY: Past Surgical History:  Procedure Laterality Date   APPENDECTOMY     COLONOSCOPY     COLONOSCOPY WITH PROPOFOL N/A 03/13/2016   Procedure: COLONOSCOPY WITH PROPOFOL;  Surgeon: Christena Deem, MD;  Location: [facility] ENDOSCOPY;  Service: Endoscopy;  Laterality: N/A;   diplopia     ESOPHAGOGASTRODUODENOSCOPY  08/2014, 06/14/2012   ESOPHAGOGASTRODUODENOSCOPY (EGD) WITH PROPOFOL N/A 03/13/2016   Procedure: ESOPHAGOGASTRODUODENOSCOPY (EGD) WITH PROPOFOL;  Surgeon: Christena Deem, MD;  Location: [facility] ENDOSCOPY;  Service: Endoscopy;  Laterality: N/A;   ESOPHAGOGASTRODUODENOSCOPY (EGD) WITH PROPOFOL N/A 07/15/2018   Procedure: ESOPHAGOGASTRODUODENOSCOPY (EGD) WITH PROPOFOL;  Surgeon: Christena Deem, MD;  Location: [facility] ENDOSCOPY;  Service: Endoscopy;  Laterality: N/A;   EYE SURGERY  08/16/2003   eye sug    MOHS SURGERY  2011, 2012   left ear and right cheek   STRABISMUS SURGERY  10/20/2015   2 horizontal muscles-left   TONSILLECTOMY     WISDOM TOOTH EXTRACTION      SOCIAL HISTORY: Social History   Socioeconomic History   Marital status: Married    Spouse name: Not on file   Number of children: Not on file   Years of education: Not on file   Highest education level: Not on file  Occupational History   Not on file  Tobacco Use   Smoking status: Never   Smokeless tobacco: Never  Vaping Use   Vaping status: Never Used  Substance and Sexual Activity   Alcohol use: Yes    Comment: social drinker   Drug use: Never   Sexual activity: Yes  Other Topics Concern   Not on file  Social History Narrative   ** Merged History Encounter **        Lives in [place]; no smoking; 4-5 cocktails a week; 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   Social Determinants of Corporate investment banker Strain: Patient Declined (05/03/2023)   Received from [facility]   Overall Financial Resource Strain (CARDIA)    Difficulty of Paying Living Expenses: Patient declined  Recent Concern: Physicist, medical Strain - High Risk (04/28/2023)   Received from [facility]   Overall Financial Resource Strain (CARDIA)    Difficulty of Paying Living Expenses: Very hard  Food Insecurity: Patient Declined (05/03/2023)   Received from [facility]   Hunger Vital Sign    Worried About Running Out of Food in the Last Year: Patient declined    Ran Out of Food in the Last Year: Patient declined  Transportation Needs: Patient Declined (05/03/2023)   Received from [facility]   PRAPARE - Transportation    In the past 12 months, has lack of transportation kept you from medical appointments or from getting medications?: Patient declined    Lack of Transportation (Non-Medical): Patient declined  Physical Activity: Not on file   Stress: Not on file  Social Connections: Not on file  Intimate Partner Violence: Not on file    FAMILY HISTORY: Family History  Problem Relation Age of Onset   Heart failure Mother    Osteoporosis Mother    Stroke Mother    Heart attack Father    Gout Father    Diabetes Mellitus II Father    Renal Disease Father    Alcohol abuse Father    Osteoporosis Maternal Grandfather    Osteoporosis Maternal Grandmother     ALLERGIES:  is allergic to levofloxacin and other.  MEDICATIONS:  Current Outpatient Medications  Medication Sig Dispense Refill   clobetasol ointment (TEMOVATE) 0.05 % Apply 1 Application topically daily.     eltrombopag (PROMACTA) 50 MG tablet Take 1 tablet (50 mg total) by mouth daily. Take on an empty stomach 1 hour before a meal or 2 hours after 30 tablet 1   ezetimibe (ZETIA) 10 MG tablet Take 1 tablet (10 mg total) by mouth daily. 90 tablet 3  finasteride (PROSCAR) 5 MG tablet Take 5 mg by mouth at bedtime.     folic acid (FOLVITE) 1 MG tablet Take 3 mg by mouth daily.     ketoconazole (NIZORAL) 2 % shampoo Apply 1 Application topically every other day.     methotrexate (RHEUMATREX) 2.5 MG tablet Take 10 mg by mouth once a week.     Multiple Vitamins-Minerals (MULTIVITAMIN WITH MINERALS) tablet Take 1 tablet by mouth daily.     omeprazole (PRILOSEC) 20 MG capsule Take 20 mg by mouth daily.     silodosin (RAPAFLO) 8 MG CAPS capsule Take 1 capsule (8 mg total) by mouth daily with breakfast. 90 capsule 3   Testosterone 1.62 % GEL Apply 3 Pump topically daily.     valACYclovir (VALTREX) 1000 MG tablet Take 500 mg by mouth daily.     Multiple Vitamin (MULTI-VITAMINS) TABS Take by mouth daily.      No current facility-administered medications for this visit.      Marland Kitchen  PHYSICAL EXAMINATION:   Vitals:   07/19/23 1019  BP: (!) 144/80  Pulse: 73  Temp: 97.8 F (36.6 C)  SpO2: 96%    Filed Weights   07/19/23 1019  Weight: 172 lb (78 kg)     Physical  Exam HENT:     Head: Normocephalic and atraumatic.     Mouth/Throat:     Pharynx: No oropharyngeal exudate.  Eyes:     Pupils: Pupils are equal, round, and reactive to light.  Cardiovascular:     Rate and Rhythm: Normal rate and regular rhythm.  Pulmonary:     Effort: No respiratory distress.     Breath sounds: No wheezing.  Abdominal:     General: Bowel sounds are normal. There is no distension.     Palpations: Abdomen is soft. There is no mass.     Tenderness: There is no abdominal tenderness. There is no guarding or rebound.  Musculoskeletal:        General: No tenderness. Normal range of motion.     Cervical back: Normal range of motion and neck supple.  Skin:    General: Skin is warm.  Neurological:     Mental Status: He is alert and oriented to person, place, and time.  Psychiatric:        Mood and Affect: Affect normal.      LABORATORY DATA:  I have reviewed the data as listed Lab Results  Component Value Date   WBC 4.2 07/19/2023   HGB 15.8 07/19/2023   HCT 46.8 07/19/2023   MCV 96.9 07/19/2023   PLT 87 (L) 07/19/2023   Recent Labs    03/02/23 1045 03/03/23 0334 03/06/23 1042 04/23/23 2305 06/20/23 0830  NA 140 139  --  141 141  K 4.0 4.0  --  3.6 3.9  CL 107 106  --  108 107  CO2 23 24  --  26 28  GLUCOSE 159* 135*  --  148* 110*  BUN 19 26* 20 19 17   CREATININE 1.41* 1.44* 1.07 1.10 1.02  CALCIUM 8.8* 8.5*  --  8.5* 8.7*  GFRNONAA 48* 47* >60 >60 >60  PROT 6.2*  --   --  6.9 7.1  ALBUMIN 4.1  --   --  4.2 4.4  AST 41  --   --  30 30  ALT 32  --   --  27 32  ALKPHOS 54  --   --  62 54  BILITOT 1.2  --   --  0.6 1.2     ASSESSMENT & PLAN:   Thrombocytopenia (HCC) # Chronic thrombocytopenia isolated- > 100 [since 2015]-Clinically suspicious of ITP [although mild splenomegaly]; less likely from alcohol/MDS.  No bone marrow biopsy.   # Given recent fall/subdural hematoma [see below]- platelets- 89-  dex 20 mg daily x5. Goal platelets > 100;  however no significant improvement noted.  Recommend starting Promacta-  # subdural hematoma-right meningeal artery embolization [needing- platelet transfusion at 80s in [facility]- Aug 2024; Dr.Cooke/Dr.Shah] -CT NOV 11th- improving not resolved.  We will proceed with Promacta above-with the goal of about 100.  # Psoriasis- on MXT- STABLE.  # DISPOSITION:   # follow up in 4 weeks-- MD/labs-cbc/cmp/ldh- Dr.B  Cc; Dr.Bronstein   Encounter Diagnosis  Name Primary?   Thrombocytopenia (HCC) Yes      Earna Coder, MD 07/19/2023 10:55 AM

## 2023-07-19 NOTE — Progress Notes (Signed)
Patient education documented in EPIC note on 07/19/23.

## 2023-07-19 NOTE — Telephone Encounter (Signed)
Patient successfully OnBoarded and drug education provided by pharmacist. Medication scheduled to be shipped on Monday, 07/22/23, for delivery on Tuesday, 07/23/23, from [facility] to patient's address. Patient also knows to call me at [phone] with any questions or concerns regarding receiving medication or if there is any unexpected change in co-pay.    Ardeen Fillers, CPhT Oncology Pharmacy Patient Advocate  [facility]  [phone] (phone) [phone] (fax) 07/19/2023 11:07 AM

## 2023-07-19 NOTE — Assessment & Plan Note (Addendum)
#   Chronic thrombocytopenia isolated- > 100 [since 2015]-Clinically suspicious of ITP [although mild splenomegaly]; less likely from alcohol/MDS.  No bone marrow biopsy.   # Given recent fall/subdural hematoma [see below]- platelets- 89-  dex 20 mg daily x5. Goal platelets > 100; however no significant improvement noted.  Recommend starting Promacta-  # subdural hematoma-right meningeal artery embolization [needing- platelet transfusion at 80s in [facility]- Aug 2024; Dr.Cooke/Dr.Shah] -CT NOV 11th- improving not resolved.  We will proceed with Promacta above-with the goal of about 100.  # Psoriasis- on MXT- STABLE.  # DISPOSITION:   # follow up in 4 weeks-- MD/labs-cbc/cmp/ldh- Dr.B  Cc; Dr.Bronstein

## 2023-07-19 NOTE — Progress Notes (Signed)
Clinical Pharmacist Practitioner Clinic [facility]  [phone] [phone]  Patient Care Team: [name], MD as PCP - General (Family Medicine) Mariah Milling Tollie Pizza, MD as Consulting Physician (Cardiology) [name], MD (Family Medicine) Earna Coder, MD as Consulting Physician (Oncology)   Name of the patient: [name]  [id number]  1934/08/20   Date of visit: 07/19/23  HPI: Patient is a 89 y.o. male with thrombocytopenia. Planned treatment with Promacta (eltrombopag).   Reason for Consult: Eltrombopag oral chemotherapy education.   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, 12/26/10, 06/21/08, 04/20/06, 04/11/03, 01/05/03   Cancer (HCC)    melanoma   Cardiac arrhythmia    Cataract    Chicken pox    Colon polyp 12/29/10, 04/18/06, 01/05/03   Diplopia    Dysrhythmia    Essential hypertension    Exotropia, left eye    GERD (gastroesophageal reflux disease)    Gout    H/O: CVA (cerebrovascular accident)    Hemorrhoid    Hepatic cyst    History of kidney stones    Low testosterone    Melanoma in situ (HCC)    left ear, right cheek   Migraine    Near syncope    Nephrolithiasis    Nephrolithiasis    Occlusion and stenosis of vertebral artery    Psoriasis    Reflux    Splenomegaly    Strabismus    Subclavian steal syndrome    Syncope and collapse    Thrombocytopenia (HCC)    Ventricular premature depolarization    Vertebral artery stenosis     HEMATOLOGY/ONCOLOGY HISTORY:  Oncology History   No history exists.    ALLERGIES:  is allergic to levofloxacin and other.  MEDICATIONS:  Current Outpatient Medications  Medication Sig Dispense Refill   clobetasol ointment (TEMOVATE) 0.05 % Apply 1 Application topically daily.     eltrombopag (PROMACTA) 50 MG tablet Take 1 tablet (50 mg total) by mouth  daily. Take on an empty stomach 1 hour before a meal or 2 hours after 30 tablet 1   ezetimibe (ZETIA) 10 MG tablet Take 1 tablet (10 mg total) by mouth daily. 90 tablet 3   finasteride (PROSCAR) 5 MG tablet Take 5 mg by mouth at bedtime.     folic acid (FOLVITE) 1 MG tablet Take 3 mg by mouth daily.     ketoconazole (NIZORAL) 2 % shampoo Apply 1 Application topically every other day.     methotrexate (RHEUMATREX) 2.5 MG tablet Take 10 mg by mouth once a week.     Multiple Vitamin (MULTI-VITAMINS) TABS Take by mouth daily.      Multiple Vitamins-Minerals (MULTIVITAMIN WITH MINERALS) tablet Take 1 tablet by mouth daily.     omeprazole (PRILOSEC) 20 MG capsule Take 20 mg by mouth daily.     silodosin (RAPAFLO) 8 MG CAPS capsule Take 1 capsule (8 mg total) by mouth daily with breakfast. 90 capsule 3   Testosterone 1.62 % GEL Apply 3 Pump topically daily.     valACYclovir (VALTREX) 1000 MG tablet Take 500 mg by mouth daily.     No current facility-administered medications for this visit.    VITAL SIGNS: There were no vitals taken for this visit. There were no vitals filed for this visit.  Estimated body mass index is 24.68 kg/m as calculated from the following:  Height as of an earlier encounter on 07/19/23: 5\' 10"  (1.778 m).   Weight as of an earlier encounter on 07/19/23: 78 kg (172 lb).  LABS: CBC:    Component Value Date/Time   WBC 4.2 07/19/2023 1020   WBC 4.1 06/20/2023 0830   HGB 15.8 07/19/2023 1020   HGB 17.5 06/03/2014 1536   HCT 46.8 07/19/2023 1020   HCT 53.8 (H) 06/03/2014 1536   PLT 87 (L) 07/19/2023 1020   PLT 99 (L) 06/03/2014 1536   MCV 96.9 07/19/2023 1020   MCV 94 06/03/2014 1536   NEUTROABS 1.9 07/19/2023 1020   NEUTROABS 3.9 06/03/2014 1536   LYMPHSABS 1.6 07/19/2023 1020   LYMPHSABS 0.6 (L) 06/03/2014 1536   MONOABS 0.4 07/19/2023 1020   MONOABS 0.4 06/03/2014 1536   EOSABS 0.2 07/19/2023 1020   EOSABS 0.1 06/03/2014 1536   BASOSABS 0.0 07/19/2023 1020    BASOSABS 0.0 06/03/2014 1536   Comprehensive Metabolic Panel:    Component Value Date/Time   NA 142 07/19/2023 1020   NA 142 06/03/2014 1536   K 3.8 07/19/2023 1020   K 3.7 06/03/2014 1536   CL 107 07/19/2023 1020   CL 106 06/03/2014 1536   CO2 26 07/19/2023 1020   CO2 31 06/03/2014 1536   BUN 17 07/19/2023 1020   BUN 18 06/03/2014 1536   CREATININE 1.02 07/19/2023 1020   CREATININE 1.24 06/03/2014 1536   GLUCOSE 144 (H) 07/19/2023 1020   GLUCOSE 121 (H) 06/03/2014 1536   CALCIUM 8.8 (L) 07/19/2023 1020   CALCIUM 8.2 (L) 06/03/2014 1536   AST 29 07/19/2023 1020   ALT 29 07/19/2023 1020   ALKPHOS 67 07/19/2023 1020   BILITOT 0.8 07/19/2023 1020   PROT 6.6 07/19/2023 1020   ALBUMIN 4.2 07/19/2023 1020     Present during today's visit: patient only  Start plan: Patient to start when he has medication in hand on 07/23/23   Patient Education I spoke with patient for overview of new oral chemotherapy medication: eltrombopag   Administration: Counseled patient on administration, dosing, side effects, monitoring, drug-food interactions, safe handling, storage, and disposal. Patient will take 1 tablet (50 mg total) by mouth daily. Take on an empty stomach 1 hour before a meal or 2 hours after.  Discussed spacing from antacids, foods high in calcium, or certain supplements (ex. Calcium supplement). Administer eltrombopag at least 2 hours before or 4 hours after the previously listed medication  Side Effects: Side effects include but not limited to: nausea or diarrhea.    Adherence: After discussion with patient no patient barriers to medication adherence identified.  Reviewed with patient importance of keeping a medication schedule and plan for any missed doses.  [name] voiced understanding and appreciation. All questions answered. Medication handout provided.  Provided patient with Oral Chemotherapy Navigation Clinic phone number. Patient knows to call the office  with questions or concerns. Oral Chemotherapy Navigation Clinic will continue to follow.  Patient expressed understanding and was in agreement with this plan. He also understands that He can call clinic at any time with any questions, concerns, or complaints.   Medication Access Issues: No issue patient will receive medication from [facility] (Specialty)  Follow-up plan: RTC as scheduled  Thank you for allowing me to participate in the care of this patient.   Time Total: 15 mins  Visit consisted of counseling and education on dealing with issues of symptom management in the setting of serious and potentially life-threatening illness.Greater than  50%  of this time was spent counseling and coordinating care related to the above assessment and plan.  Signed by: Remi Haggard, PharmD, BCPS, Nolon Bussing, CPP Hematology/Oncology Clinical Pharmacist Practitioner [facility] [phone]  07/19/2023 11:48 AM

## 2023-07-19 NOTE — Progress Notes (Signed)
Specialty Pharmacy Initial Fill Coordination Note  [name] is a 89 y.o. male contacted today regarding refills of specialty medication(s) Eltrombopag Olamine   Patient requested Delivery   Delivery date: 07/23/23   Verified address: [address] [place], [place] [address]   Medication will be filled on 07/22/23.   Patient is aware of $100.00 copayment. Patient requests pharmacy call before shipping to provide CC information. Patient did not want to provide in clinic.    Ardeen Fillers, CPhT Oncology Pharmacy Patient Advocate  [facility]  [phone] (phone) [phone] (fax) 07/19/2023 11:04 AM

## 2023-07-19 NOTE — Progress Notes (Signed)
CT scan head 07/15/23.  Why are platelets low?

## 2023-07-22 ENCOUNTER — Other Ambulatory Visit (HOSPITAL_COMMUNITY): Payer: Self-pay

## 2023-07-22 ENCOUNTER — Other Ambulatory Visit: Payer: Self-pay

## 2023-08-08 ENCOUNTER — Other Ambulatory Visit (HOSPITAL_COMMUNITY): Payer: Self-pay

## 2023-08-09 ENCOUNTER — Other Ambulatory Visit (HOSPITAL_COMMUNITY): Payer: Self-pay

## 2023-08-09 ENCOUNTER — Other Ambulatory Visit: Payer: Self-pay

## 2023-08-14 ENCOUNTER — Ambulatory Visit: Payer: Medicare PPO | Admitting: Urology

## 2023-08-14 ENCOUNTER — Other Ambulatory Visit: Payer: Self-pay

## 2023-08-15 ENCOUNTER — Other Ambulatory Visit: Payer: Self-pay

## 2023-08-15 NOTE — Progress Notes (Signed)
Patient decided he does not want refill until after next appointment 08/23/23.

## 2023-08-15 NOTE — Progress Notes (Signed)
Specialty Pharmacy Ongoing Clinical Assessment Note  [name] is a 89 y.o. male who is being followed by the specialty pharmacy service for RxSp Oncology   Patient's specialty medication(s) reviewed today: Eltrombopag Olamine (PROMACTA)   Missed doses in the last 4 weeks: 0   Patient/Caregiver did not have any additional questions or concerns.   Therapeutic benefit summary: Unable to assess   Adverse events/side effects summary: Unable to assess   Patient's therapy is appropriate to: Continue    Goals Addressed             This Visit's Progress    Laboratory improvement       Patient is unable to be assessed as therapy was recently initiated. Patient will maintain adherence and adhere to provider and/or lab appointments          Follow up:  3 months  Otto Herb Specialty Pharmacist

## 2023-08-23 ENCOUNTER — Encounter: Payer: Self-pay | Admitting: Internal Medicine

## 2023-08-23 ENCOUNTER — Inpatient Hospital Stay (HOSPITAL_BASED_OUTPATIENT_CLINIC_OR_DEPARTMENT_OTHER): Payer: Medicare PPO | Admitting: Internal Medicine

## 2023-08-23 ENCOUNTER — Inpatient Hospital Stay: Payer: Medicare PPO | Attending: Internal Medicine

## 2023-08-23 VITALS — BP 146/80 | HR 58 | Temp 97.8°F | Ht 70.0 in | Wt 169.8 lb

## 2023-08-23 DIAGNOSIS — Z79631 Long term (current) use of antimetabolite agent: Secondary | ICD-10-CM | POA: Diagnosis not present

## 2023-08-23 DIAGNOSIS — D696 Thrombocytopenia, unspecified: Secondary | ICD-10-CM | POA: Insufficient documentation

## 2023-08-23 DIAGNOSIS — L409 Psoriasis, unspecified: Secondary | ICD-10-CM | POA: Insufficient documentation

## 2023-08-23 DIAGNOSIS — Z79899 Other long term (current) drug therapy: Secondary | ICD-10-CM | POA: Insufficient documentation

## 2023-08-23 DIAGNOSIS — Z79624 Long term (current) use of inhibitors of nucleotide synthesis: Secondary | ICD-10-CM | POA: Diagnosis not present

## 2023-08-23 LAB — CBC WITH DIFFERENTIAL (CANCER CENTER ONLY)
Abs Immature Granulocytes: 0.02 10*3/uL (ref 0.00–0.07)
Basophils Absolute: 0 10*3/uL (ref 0.0–0.1)
Basophils Relative: 0 %
Eosinophils Absolute: 0.2 10*3/uL (ref 0.0–0.5)
Eosinophils Relative: 3 %
HCT: 47.8 % (ref 39.0–52.0)
Hemoglobin: 16.4 g/dL (ref 13.0–17.0)
Immature Granulocytes: 0 %
Lymphocytes Relative: 42 %
Lymphs Abs: 2.2 10*3/uL (ref 0.7–4.0)
MCH: 32.9 pg (ref 26.0–34.0)
MCHC: 34.3 g/dL (ref 30.0–36.0)
MCV: 96 fL (ref 80.0–100.0)
Monocytes Absolute: 0.4 10*3/uL (ref 0.1–1.0)
Monocytes Relative: 8 %
Neutro Abs: 2.4 10*3/uL (ref 1.7–7.7)
Neutrophils Relative %: 47 %
Platelet Count: 130 10*3/uL — ABNORMAL LOW (ref 150–400)
RBC: 4.98 MIL/uL (ref 4.22–5.81)
RDW: 13.8 % (ref 11.5–15.5)
WBC Count: 5.1 10*3/uL (ref 4.0–10.5)
nRBC: 0 % (ref 0.0–0.2)

## 2023-08-23 LAB — CMP (CANCER CENTER ONLY)
ALT: 37 U/L (ref 0–44)
AST: 36 U/L (ref 15–41)
Albumin: 4.5 g/dL (ref 3.5–5.0)
Alkaline Phosphatase: 53 U/L (ref 38–126)
Anion gap: 10 (ref 5–15)
BUN: 18 mg/dL (ref 8–23)
CO2: 27 mmol/L (ref 22–32)
Calcium: 9.1 mg/dL (ref 8.9–10.3)
Chloride: 103 mmol/L (ref 98–111)
Creatinine: 1.1 mg/dL (ref 0.61–1.24)
GFR, Estimated: >60 mL/min (ref >60)
Glucose, Bld: 107 mg/dL — ABNORMAL HIGH (ref 70–99)
Potassium: 4.3 mmol/L (ref 3.5–5.1)
Sodium: 140 mmol/L (ref 135–145)
Total Bilirubin: 0.9 mg/dL (ref <1.2)
Total Protein: 7.2 g/dL (ref 6.5–8.1)

## 2023-08-23 LAB — LACTATE DEHYDROGENASE: LDH: 168 U/L (ref 98–192)

## 2023-08-23 NOTE — Progress Notes (Signed)
[name] CONSULT NOTE  Patient Care Team: Kenneth Baseman, MD as PCP - General (Family Medicine) [name] Kenneth Pizza, MD as Consulting Physician (Cardiology) Kenneth Baseman, MD (Family Medicine) Kenneth Coder, MD as Consulting Physician (Oncology)  CHIEF COMPLAINTS/PURPOSE OF CONSULTATION: Thrombocytopenia  # 2013- CHRONIC ISOLATED THROMBOCYTOPENIA- 130-99; May 2017- 115 [place]- 2015- MILD splenomegaly; liver Elastography- Dr.Skulskie-N/ no cirrhosis; hepatitis B/C- NEG; Monoclonal work up-NEG- NOV 2024Lysle Ball hemorrhage]- Patient s/p Dex- no improvement noted; November 2024-Promacta 50 mg/day [given brain ]  # On testosterone supplementation; 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  Alone.  [name] 89 y.o.  male here for follow-up because of his thrombocytopenia clinically ITP; s/p  with subdural hematoma [s/p fall] on promacta 50 mg a day is here for a follow up.  Patient had a recent CT scan brain in in December 2024 noted to have significant improvement of his brain hemorrhage.  Denies any head aches.     He denies any bleeding episodes, stool changes, melena stools or constipation.  No other pains. He continues to be physically active, but no cycling sec to recent fall.    Review of Systems  Constitutional:  Negative for chills, diaphoresis, fever, malaise/fatigue and weight loss.  HENT:  Negative for nosebleeds and sore throat.   Eyes:  Negative for double vision.  Respiratory:  Negative for cough, hemoptysis, sputum production, shortness of breath and wheezing.   Cardiovascular:  Negative for chest pain, palpitations, orthopnea and leg swelling.  Gastrointestinal:  Negative for abdominal pain, blood in stool, constipation, diarrhea, heartburn, melena, nausea and vomiting.  Genitourinary:  Negative for dysuria, frequency and urgency.  Musculoskeletal:  Negative for back pain and  joint pain.  Skin: Negative.  Negative for itching and rash.  Neurological:  Negative for dizziness, tingling, focal weakness, weakness and headaches.  Endo/Heme/Allergies:  Does not bruise/bleed easily.  Psychiatric/Behavioral:  Negative for depression. The patient is not nervous/anxious and does not have insomnia.      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, 12/26/10, 06/21/08, 04/20/06, 04/11/03, 01/05/03   Cancer (HCC)    melanoma   Cardiac arrhythmia    Cataract    Chicken pox    Colon polyp 12/29/10, 04/18/06, 01/05/03   Diplopia    Dysrhythmia    Essential hypertension    Exotropia, left eye    GERD (gastroesophageal reflux disease)    Gout    H/O: CVA (cerebrovascular accident)    Hemorrhoid    Hepatic cyst    History of kidney stones    Low testosterone    Melanoma in situ (HCC)    left ear, right cheek   Migraine    Near syncope    Nephrolithiasis    Nephrolithiasis    Occlusion and stenosis of vertebral artery    Psoriasis    Reflux    Splenomegaly    Strabismus    Subclavian steal syndrome    Syncope and collapse    Thrombocytopenia (HCC)    Ventricular premature depolarization    Vertebral artery stenosis     SURGICAL HISTORY: Past Surgical History:  Procedure Laterality Date   APPENDECTOMY     COLONOSCOPY     COLONOSCOPY WITH PROPOFOL N/A 03/13/2016   Procedure: COLONOSCOPY WITH PROPOFOL;  Surgeon: Kenneth Deem, MD;  Location: [facility] ENDOSCOPY;  Service:  Endoscopy;  Laterality: N/A;   diplopia     ESOPHAGOGASTRODUODENOSCOPY  08/2014, 06/14/2012   ESOPHAGOGASTRODUODENOSCOPY (EGD) WITH PROPOFOL N/A 03/13/2016   Procedure: ESOPHAGOGASTRODUODENOSCOPY (EGD) WITH PROPOFOL;  Surgeon: Kenneth Deem, MD;  Location[facility] ENDOSCOPY;  Service: Endoscopy;  Laterality: N/A;   ESOPHAGOGASTRODUODENOSCOPY (EGD) WITH PROPOFOL N/A 07/15/2018   Procedure:  ESOPHAGOGASTRODUODENOSCOPY (EGD) WITH PROPOFOL;  Surgeon: Kenneth Deem, MD;  Location: [facility] ENDOSCOPY;  Service: Endoscopy;  Laterality: N/A;   EYE SURGERY  08/16/2003   eye sug   MOHS SURGERY  2011, 2012   left ear and right cheek   STRABISMUS SURGERY  10/20/2015   2 horizontal muscles-left   TONSILLECTOMY     WISDOM TOOTH EXTRACTION      SOCIAL HISTORY: Social History   Socioeconomic History   Marital status: Married    Spouse name: Not on file   Number of children: Not on file   Years of education: Not on file   Highest education level: Not on file  Occupational History   Not on file  Tobacco Use   Smoking status: Never   Smokeless tobacco: Never  Vaping Use   Vaping status: Never Used  Substance and Sexual Activity   Alcohol use: Yes    Comment: social drinker   Drug use: Never   Sexual activity: Yes  Other Topics Concern   Not on file  Social History Narrative   ** Merged History Encounter **        Lives in [place]; no smoking; 4-5 cocktails a week; 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   Social Drivers of Corporate investment banker Strain: Patient Declined (05/03/2023)   Received from [facility]   Overall Financial Resource Strain (CARDIA)    Difficulty of Paying Living Expenses: Patient declined  Recent Concern: Physicist, medical Strain - High Risk (04/28/2023)   Received from [facility]   Overall Financial Resource Strain (CARDIA)    Difficulty of Paying Living Expenses: Very hard  Food Insecurity: Patient Declined (05/03/2023)   Received [facility]   Hunger Vital Sign    Worried About Running Out of Food in the Last Year: Patient declined    Ran Out of Food in the Last Year: Patient declined  Transportation Needs: Patient Declined (05/03/2023)   Received from [facility]   PRAPARE - Transportation    In the past 12 months, has lack of  transportation kept you from medical appointments or from getting medications?: Patient declined    Lack of Transportation (Non-Medical): Patient declined  Physical Activity: Not on file  Stress: Not on file  Social Connections: Not on file  Intimate Partner Violence: Not on file    FAMILY HISTORY: Family History  Problem Relation Age of Onset   Heart failure Mother    Osteoporosis Mother    Stroke Mother    Heart attack Father    Gout Father    Diabetes Mellitus II Father    Renal Disease Father    Alcohol abuse Father    Osteoporosis Maternal Grandfather    Osteoporosis Maternal Grandmother     ALLERGIES:  is allergic to levofloxacin and other.  MEDICATIONS:  Current Outpatient Medications  Medication Sig Dispense Refill   clobetasol ointment (TEMOVATE) 0.05 % Apply 1 Application topically daily.     eltrombopag (PROMACTA) 50 MG tablet Take 1 tablet (50 mg total) by mouth daily. Take on an empty stomach 1  hour before a meal or 2 hours after 30 tablet 1   ezetimibe (ZETIA) 10 MG tablet Take 1 tablet (10 mg total) by mouth daily. 90 tablet 3   finasteride (PROSCAR) 5 MG tablet Take 5 mg by mouth at bedtime.     folic acid (FOLVITE) 1 MG tablet Take 3 mg by mouth daily.     ketoconazole (NIZORAL) 2 % shampoo Apply 1 Application topically every other day.     methotrexate (RHEUMATREX) 2.5 MG tablet Take 10 mg by mouth once a week.     Multiple Vitamin (MULTI-VITAMINS) TABS Take by mouth daily.      Multiple Vitamins-Minerals (MULTIVITAMIN WITH MINERALS) tablet Take 1 tablet by mouth daily.     omeprazole (PRILOSEC) 20 MG capsule Take 20 mg by mouth daily.     silodosin (RAPAFLO) 8 MG CAPS capsule Take 1 capsule (8 mg total) by mouth daily with breakfast. 90 capsule 3   Testosterone 1.62 % GEL Apply 3 Pump topically daily.     valACYclovir (VALTREX) 1000 MG tablet Take 500 mg by mouth daily.     No current facility-administered medications for this visit.      Marland Kitchen  PHYSICAL  EXAMINATION:   Vitals:   08/23/23 1257 08/23/23 1307  BP: (!) 176/72 (!) 146/80  Pulse: (!) 58   Temp: 97.8 F (36.6 C)   SpO2: 100%     Filed Weights   08/23/23 1257  Weight: 169 lb 12.8 oz (77 kg)     Physical Exam HENT:     Head: Normocephalic and atraumatic.     Mouth/Throat:     Pharynx: No oropharyngeal exudate.  Eyes:     Pupils: Pupils are equal, round, and reactive to light.  Cardiovascular:     Rate and Rhythm: Normal rate and regular rhythm.  Pulmonary:     Effort: No respiratory distress.     Breath sounds: No wheezing.  Abdominal:     General: Bowel sounds are normal. There is no distension.     Palpations: Abdomen is soft. There is no mass.     Tenderness: There is no abdominal tenderness. There is no guarding or rebound.  Musculoskeletal:        General: No tenderness. Normal range of motion.     Cervical back: Normal range of motion and neck supple.  Skin:    General: Skin is warm.  Neurological:     Mental Status: He is alert and oriented to person, place, and time.  Psychiatric:        Mood and Affect: Affect normal.      LABORATORY DATA:  I have reviewed the data as listed Lab Results  Component Value Date   WBC 5.1 08/23/2023   HGB 16.4 08/23/2023   HCT 47.8 08/23/2023   MCV 96.0 08/23/2023   PLT 130 (L) 08/23/2023   Recent Labs    06/20/23 0830 07/19/23 1020 08/23/23 1247  NA 141 142 140  K 3.9 3.8 4.3  CL 107 107 103  CO2 28 26 27   GLUCOSE 110* 144* 107*  BUN 17 17 18   CREATININE 1.02 1.02 1.10  CALCIUM 8.7* 8.8* 9.1  GFRNONAA >60 >60 >60  PROT 7.1 6.6 7.2  ALBUMIN 4.4 4.2 4.5  AST 30 29 36  ALT 32 29 37  ALKPHOS 54 67 53  BILITOT 1.2 0.8 0.9     ASSESSMENT & PLAN:   Thrombocytopenia (HCC) # Chronic thrombocytopenia isolated- > 100 [since 2015]; Likely ITP [  although mild splenomegaly]; less likely from alcohol/MDS.  No bone marrow biopsy. NOV 2024- NO improvement with dex 20 mg daily x5. Currently on promacta 50  mg/day. Goal > 100 [subdural hematoma]  Today platelets 130- continue promacta at 50 mg/da- if consistent > 100  then consider promacta every other day. Duration is currently opne at this time.   # subdural hematoma-right meningeal artery embolization [needing- platelet transfusion at 80s in [facility]- Aug 2024; Dr.Cooke/Dr.Shah] -CT NOV  2024- improved. [] Decreased size of the mixed attenuation subdural collection along the right frontal convexity, now measuring up to 12 mm, previously 17 mm. Unchanged small left frontal convexity subdural collection, measuring up to 8 mm].   # Psoriasis- on MXT- STABLE.  # DISPOSITION:  # follow up in 2 months - MD/labs-cbc/cmp/ldh- Dr.B  Cc; Dr.Bronstein   Encounter Diagnosis  Name Primary?   Thrombocytopenia (HCC) Yes      Kenneth Coder, MD 08/23/2023 1:25 PM

## 2023-08-23 NOTE — Progress Notes (Signed)
Bruising: no  Bleeding from gums: no    Had a head ct done at [facility] since last visit.

## 2023-08-23 NOTE — Assessment & Plan Note (Addendum)
#   Chronic thrombocytopenia isolated- > 100 [since 2015]; Likely ITP [although mild splenomegaly]; less likely from alcohol/MDS.  No bone marrow biopsy. NOV 2024- NO improvement with dex 20 mg daily x5. Currently on promacta 50 mg/day. Goal > 100 [subdural hematoma]  Today platelets 130- continue promacta at 50 mg/da- if consistent > 100  then consider promacta every other day. Duration is currently open at this time.   # subdural hematoma-right meningeal artery embolization [needing- platelet transfusion at 80s in [facility]- Aug 2024; Dr.Cooke/Dr.Shah] -CT NOV  2024- improved. [] Decreased size of the mixed attenuation subdural collection along the right frontal convexity, now measuring up to 12 mm, previously 17 mm. Unchanged small left frontal convexity subdural collection, measuring up to 8 mm].   # Psoriasis- on MXT- STABLE.  # DISPOSITION:  # follow up in 2 months - MD/labs-cbc/cmp/ldh- Dr.B  Cc; Dr.Bronstein

## 2023-08-29 ENCOUNTER — Other Ambulatory Visit: Payer: Self-pay

## 2023-09-02 ENCOUNTER — Other Ambulatory Visit (HOSPITAL_COMMUNITY): Payer: Self-pay

## 2023-09-02 NOTE — Progress Notes (Signed)
Specialty Pharmacy Refill Coordination Note  [name] is a 89 y.o. male contacted today regarding refills of specialty medication(s) Eltrombopag [facility])   Patient requested Delivery   Delivery date: 09/05/23   Verified address: [address]  [place]   Medication will be filled on 09/03/23.

## 2023-09-03 ENCOUNTER — Other Ambulatory Visit: Payer: Self-pay

## 2023-09-16 ENCOUNTER — Other Ambulatory Visit: Payer: Self-pay | Admitting: Urology

## 2023-09-30 ENCOUNTER — Other Ambulatory Visit: Payer: Self-pay

## 2023-09-30 ENCOUNTER — Other Ambulatory Visit: Payer: Self-pay | Admitting: Internal Medicine

## 2023-09-30 NOTE — Progress Notes (Addendum)
Specialty Pharmacy Refill Coordination Note  [name] is a 89 y.o. male contacted today regarding refills of specialty medication(s) Eltrombopag [facility])   Patient requested Delivery   Delivery date: 10/04/23   Verified address: [address]  [place]   Medication will be filled on 10/03/23, pending refill approval.     Refills were received 1/31. Informed [name] that medication would be delivered 24/4, and she states he has enough medication until delivery.

## 2023-10-02 ENCOUNTER — Other Ambulatory Visit: Payer: Self-pay

## 2023-10-02 ENCOUNTER — Other Ambulatory Visit (HOSPITAL_COMMUNITY): Payer: Self-pay

## 2023-10-02 ENCOUNTER — Other Ambulatory Visit: Payer: Self-pay | Admitting: Internal Medicine

## 2023-10-03 ENCOUNTER — Other Ambulatory Visit: Payer: Self-pay | Admitting: Internal Medicine

## 2023-10-03 ENCOUNTER — Other Ambulatory Visit: Payer: Self-pay

## 2023-10-04 ENCOUNTER — Other Ambulatory Visit: Payer: Self-pay

## 2023-10-04 ENCOUNTER — Other Ambulatory Visit: Payer: Self-pay | Admitting: Pharmacist

## 2023-10-04 DIAGNOSIS — D696 Thrombocytopenia, unspecified: Secondary | ICD-10-CM

## 2023-10-04 MED ORDER — ELTROMBOPAG OLAMINE 50 MG PO TABS
50.0000 mg | ORAL_TABLET | Freq: Every day | ORAL | 1 refills | Status: DC
  Filled 2023-10-04: qty 30, 30d supply, fill #0
  Filled 2023-11-06: qty 30, 30d supply, fill #1

## 2023-10-07 ENCOUNTER — Other Ambulatory Visit (HOSPITAL_COMMUNITY): Payer: Self-pay

## 2023-10-07 ENCOUNTER — Other Ambulatory Visit: Payer: Self-pay

## 2023-10-07 NOTE — Progress Notes (Signed)
10/07/23-CA: Promacta  Left patient a voicemail. Debit/Credit card declined. Need updated payment information.

## 2023-10-21 ENCOUNTER — Inpatient Hospital Stay: Payer: Medicare PPO | Admitting: Internal Medicine

## 2023-10-21 ENCOUNTER — Inpatient Hospital Stay: Payer: Medicare PPO | Attending: Internal Medicine

## 2023-10-21 ENCOUNTER — Encounter: Payer: Self-pay | Admitting: Internal Medicine

## 2023-10-21 VITALS — BP 130/89 | HR 88 | Temp 98.2°F | Resp 16 | Wt 171.0 lb

## 2023-10-21 DIAGNOSIS — L409 Psoriasis, unspecified: Secondary | ICD-10-CM | POA: Insufficient documentation

## 2023-10-21 DIAGNOSIS — Z79631 Long term (current) use of antimetabolite agent: Secondary | ICD-10-CM | POA: Insufficient documentation

## 2023-10-21 DIAGNOSIS — Z79899 Other long term (current) drug therapy: Secondary | ICD-10-CM | POA: Insufficient documentation

## 2023-10-21 DIAGNOSIS — D696 Thrombocytopenia, unspecified: Secondary | ICD-10-CM

## 2023-10-21 DIAGNOSIS — Z79624 Long term (current) use of inhibitors of nucleotide synthesis: Secondary | ICD-10-CM | POA: Diagnosis not present

## 2023-10-21 LAB — CMP (CANCER CENTER ONLY)
ALT: 25 U/L (ref 0–44)
AST: 27 U/L (ref 15–41)
Albumin: 4.4 g/dL (ref 3.5–5.0)
Alkaline Phosphatase: 61 U/L (ref 38–126)
Anion gap: 7 (ref 5–15)
BUN: 17 mg/dL (ref 8–23)
CO2: 28 mmol/L (ref 22–32)
Calcium: 8.4 mg/dL — ABNORMAL LOW (ref 8.9–10.3)
Chloride: 107 mmol/L (ref 98–111)
Creatinine: 1.05 mg/dL (ref 0.61–1.24)
GFR, Estimated: >60 mL/min (ref >60)
Glucose, Bld: 151 mg/dL — ABNORMAL HIGH (ref 70–99)
Potassium: 3.7 mmol/L (ref 3.5–5.1)
Sodium: 142 mmol/L (ref 135–145)
Total Bilirubin: 0.6 mg/dL (ref 0.0–1.2)
Total Protein: 7 g/dL (ref 6.5–8.1)

## 2023-10-21 LAB — CBC WITH DIFFERENTIAL (CANCER CENTER ONLY)
Abs Immature Granulocytes: 0.04 10*3/uL (ref 0.00–0.07)
Basophils Absolute: 0 10*3/uL (ref 0.0–0.1)
Basophils Relative: 0 %
Eosinophils Absolute: 0.1 10*3/uL (ref 0.0–0.5)
Eosinophils Relative: 2 %
HCT: 47.5 % (ref 39.0–52.0)
Hemoglobin: 16.3 g/dL (ref 13.0–17.0)
Immature Granulocytes: 1 %
Lymphocytes Relative: 24 %
Lymphs Abs: 1.7 10*3/uL (ref 0.7–4.0)
MCH: 33 pg (ref 26.0–34.0)
MCHC: 34.3 g/dL (ref 30.0–36.0)
MCV: 96.2 fL (ref 80.0–100.0)
Monocytes Absolute: 0.5 10*3/uL (ref 0.1–1.0)
Monocytes Relative: 7 %
Neutro Abs: 4.8 10*3/uL (ref 1.7–7.7)
Neutrophils Relative %: 66 %
Platelet Count: 136 10*3/uL — ABNORMAL LOW (ref 150–400)
RBC: 4.94 MIL/uL (ref 4.22–5.81)
RDW: 13.5 % (ref 11.5–15.5)
WBC Count: 7.1 10*3/uL (ref 4.0–10.5)
nRBC: 0 % (ref 0.0–0.2)

## 2023-10-21 LAB — LACTATE DEHYDROGENASE: LDH: 153 U/L (ref 98–192)

## 2023-10-21 NOTE — Patient Instructions (Signed)
#   Take promacta every other day.

## 2023-10-21 NOTE — Progress Notes (Signed)
[facility] CONSULT NOTE  Patient Care Team: [name], MD as PCP - General (Family Medicine) Mariah Milling Tollie Pizza, MD as Consulting Physician (Cardiology) [name], MD (Family Medicine) Earna Coder, MD as Consulting Physician (Oncology)  CHIEF COMPLAINTS/PURPOSE OF CONSULTATION: Thrombocytopenia  # 2013- CHRONIC ISOLATED THROMBOCYTOPENIA- 130-99; May 2017- 115 [place]- 2015- MILD splenomegaly; liver Elastography- Dr.Skulskie-N/ no cirrhosis; hepatitis B/C- NEG; Monoclonal work up-NEG- NOV 2024[name] hemorrhage]- Patient s/p Dex- no improvement noted; November 2024-Promacta 50 mg/day [given brain ]  # On testosterone supplementation; 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  Alone.  [name] 89 y.o.  male here for follow-up because of his thrombocytopenia clinically ITP; s/p  with subdural hematoma [s/p fall] on promacta 50 mg a day is here for a follow up.  Patient had a recent CT scan brain in in December 2024 noted to have significant improvement of his brain hemorrhage.  Denies any head aches.    He denies any bleeding episodes, stool changes, melena stools or constipation.  No other pains. He continues to be physically active, but no cycling sec to recent fall.    Review of Systems  Constitutional:  Negative for chills, diaphoresis, fever, malaise/fatigue and weight loss.  HENT:  Negative for nosebleeds and sore throat.   Eyes:  Negative for double vision.  Respiratory:  Negative for cough, hemoptysis, sputum production, shortness of breath and wheezing.   Cardiovascular:  Negative for chest pain, palpitations, orthopnea and leg swelling.  Gastrointestinal:  Negative for abdominal pain, blood in stool, constipation, diarrhea, heartburn, melena, nausea and vomiting.  Genitourinary:  Negative for dysuria, frequency and urgency.  Musculoskeletal:  Negative for back pain and  joint pain.  Skin: Negative.  Negative for itching and rash.  Neurological:  Negative for dizziness, tingling, focal weakness, weakness and headaches.  Endo/Heme/Allergies:  Does not bruise/bleed easily.  Psychiatric/Behavioral:  Negative for depression. The patient is not nervous/anxious and does not have insomnia.      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, 12/26/10, 06/21/08, 04/20/06, 04/11/03, 01/05/03   Cancer (HCC)    melanoma   Cardiac arrhythmia    Cataract    Chicken pox    Colon polyp 12/29/10, 04/18/06, 01/05/03   Diplopia    Dysrhythmia    Essential hypertension    Exotropia, left eye    GERD (gastroesophageal reflux disease)    Gout    H/O: CVA (cerebrovascular accident)    Hemorrhoid    Hepatic cyst    History of kidney stones    Low testosterone    Melanoma in situ (HCC)    left ear, right cheek   Migraine    Near syncope    Nephrolithiasis    Nephrolithiasis    Occlusion and stenosis of vertebral artery    Psoriasis    Reflux    Splenomegaly    Strabismus    Subclavian steal syndrome    Syncope and collapse    Thrombocytopenia (HCC)    Ventricular premature depolarization    Vertebral artery stenosis     SURGICAL HISTORY: Past Surgical History:  Procedure Laterality Date   APPENDECTOMY     COLONOSCOPY     COLONOSCOPY WITH PROPOFOL N/A 03/13/2016   Procedure: COLONOSCOPY WITH PROPOFOL;  Surgeon: Christena Deem, MD;  Location: [facility] ENDOSCOPY;  Service: Endoscopy;  Laterality: N/A;   diplopia     ESOPHAGOGASTRODUODENOSCOPY  08/2014, 06/14/2012   ESOPHAGOGASTRODUODENOSCOPY (EGD) WITH PROPOFOL N/A 03/13/2016   Procedure: ESOPHAGOGASTRODUODENOSCOPY (EGD) WITH PROPOFOL;  Surgeon: Christena Deem, MD;  Location: [facility] ENDOSCOPY;  Service: Endoscopy;  Laterality: N/A;   ESOPHAGOGASTRODUODENOSCOPY (EGD) WITH PROPOFOL N/A 07/15/2018   Procedure:  ESOPHAGOGASTRODUODENOSCOPY (EGD) WITH PROPOFOL;  Surgeon: Christena Deem, MD;  Location: [facility] ENDOSCOPY;  Service: Endoscopy;  Laterality: N/A;   EYE SURGERY  08/16/2003   eye sug   MOHS SURGERY  2011, 2012   left ear and right cheek   STRABISMUS SURGERY  10/20/2015   2 horizontal muscles-left   TONSILLECTOMY     WISDOM TOOTH EXTRACTION      SOCIAL HISTORY: Social History   Socioeconomic History   Marital status: Married    Spouse name: Not on file   Number of children: Not on file   Years of education: Not on file   Highest education level: Not on file  Occupational History   Not on file  Tobacco Use   Smoking status: Never   Smokeless tobacco: Never  Vaping Use   Vaping status: Never Used  Substance and Sexual Activity   Alcohol use: Yes    Comment: social drinker   Drug use: Never   Sexual activity: Yes  Other Topics Concern   Not on file  Social History Narrative   ** Merged History Encounter **        Lives in [place]; no smoking; 4-5 cocktails a week; 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   Social Drivers of Corporate investment banker Strain: Patient Declined (05/03/2023)   Received from [facility]   Overall Financial Resource Strain (CARDIA)    Difficulty of Paying Living Expenses: Patient declined  Recent Concern: Physicist, medical Strain - High Risk (04/28/2023)   Received [facility]   Overall Financial Resource Strain (CARDIA)    Difficulty of Paying Living Expenses: Very hard  Food Insecurity: Patient Declined (05/03/2023)   Received from [facility]   Hunger Vital Sign    Worried About Running Out of Food in the Last Year: Patient declined    Ran Out of Food in the Last Year: Patient declined  Transportation Needs: Patient Declined (05/03/2023)   Received from [facility]   PRAPARE - Transportation    In the past 12 months, has lack of  transportation kept you from medical appointments or from getting medications?: Patient declined    Lack of Transportation (Non-Medical): Patient declined  Physical Activity: Not on file  Stress: Not on file  Social Connections: Not on file  Intimate Partner Violence: Not on file    FAMILY HISTORY: Family History  Problem Relation Age of Onset   Heart failure Mother    Osteoporosis Mother    Stroke Mother    Heart attack Father    Gout Father    Diabetes Mellitus II Father    Renal Disease Father    Alcohol abuse Father    Osteoporosis Maternal Grandfather    Osteoporosis Maternal Grandmother     ALLERGIES:  is allergic to levofloxacin and other.  MEDICATIONS:  Current Outpatient Medications  Medication Sig Dispense Refill   clobetasol ointment (TEMOVATE) 0.05 % Apply 1 Application topically daily.     donepezil (ARICEPT) 5 MG tablet Take 1 tablet by mouth at bedtime.     eltrombopag (PROMACTA) 50 MG tablet Take  1 tablet (50 mg total) by mouth daily. Take on an empty stomach 1 hour before a meal or 2 hours after (Patient taking differently: Take 50 mg by mouth every other day. Take on an empty stomach 1 hour before a meal or 2 hours after every other day) 30 tablet 1   ezetimibe (ZETIA) 10 MG tablet Take 1 tablet (10 mg total) by mouth daily. 90 tablet 3   finasteride (PROSCAR) 5 MG tablet TAKE 1 TABLET BY MOUTH DAILY 90 tablet 3   folic acid (FOLVITE) 1 MG tablet Take 3 mg by mouth daily.     ketoconazole (NIZORAL) 2 % shampoo Apply 1 Application topically every other day.     methotrexate (RHEUMATREX) 2.5 MG tablet Take 10 mg by mouth once a week.     Multiple Vitamins-Minerals (MULTIVITAMIN WITH MINERALS) tablet Take 1 tablet by mouth daily.     omeprazole (PRILOSEC) 20 MG capsule Take 20 mg by mouth daily.     silodosin (RAPAFLO) 8 MG CAPS capsule Take 1 capsule (8 mg total) by mouth daily with breakfast. 90 capsule 3   Testosterone 1.62 % GEL Apply 3 Pump topically daily.      valACYclovir (VALTREX) 1000 MG tablet Take 500 mg by mouth daily.     No current facility-administered medications for this visit.      Marland Kitchen  PHYSICAL EXAMINATION:   Vitals:   10/21/23 1351  BP: 130/89  Pulse: 88  Resp: 16  Temp: 98.2 F (36.8 C)  SpO2: 99%     Filed Weights   10/21/23 1351  Weight: 171 lb (77.6 kg)      Physical Exam HENT:     Head: Normocephalic and atraumatic.     Mouth/Throat:     Pharynx: No oropharyngeal exudate.  Eyes:     Pupils: Pupils are equal, round, and reactive to light.  Cardiovascular:     Rate and Rhythm: Normal rate and regular rhythm.  Pulmonary:     Effort: No respiratory distress.     Breath sounds: No wheezing.  Abdominal:     General: Bowel sounds are normal. There is no distension.     Palpations: Abdomen is soft. There is no mass.     Tenderness: There is no abdominal tenderness. There is no guarding or rebound.  Musculoskeletal:        General: No tenderness. Normal range of motion.     Cervical back: Normal range of motion and neck supple.  Skin:    General: Skin is warm.  Neurological:     Mental Status: He is alert and oriented to person, place, and time.  Psychiatric:        Mood and Affect: Affect normal.      LABORATORY DATA:  I have reviewed the data as listed Lab Results  Component Value Date   WBC 7.1 10/21/2023   HGB 16.3 10/21/2023   HCT 47.5 10/21/2023   MCV 96.2 10/21/2023   PLT 136 (L) 10/21/2023   Recent Labs    07/19/23 1020 08/23/23 1247 10/21/23 1333  NA 142 140 142  K 3.8 4.3 3.7  CL 107 103 107  CO2 26 27 28   GLUCOSE 144* 107* 151*  BUN 17 18 17   CREATININE 1.02 1.10 1.05  CALCIUM 8.8* 9.1 8.4*  GFRNONAA >60 >60 >60  PROT 6.6 7.2 7.0  ALBUMIN 4.2 4.5 4.4  AST 29 36 27  ALT 29 37 25  ALKPHOS 67 53 61  BILITOT 0.8  0.9 0.6     ASSESSMENT & PLAN:   Thrombocytopenia (HCC) # Chronic thrombocytopenia isolated- > 100 [since 2015]; Likely ITP [although mild  splenomegaly]; less likely from alcohol/MDS.  No bone marrow biopsy. NOV 2024- NO improvement with dex 20 mg daily x5. Currently on promacta 50 mg/day. Goal > 100 [subdural hematoma]  Today platelets 136- continue promacta at 50 mg/day- Since consistently > 100  plan promacta every other day. Duration is currently open at this time.   # subdural hematoma-right meningeal artery embolization [needing- platelet transfusion at 80s in [facility]- Aug 2024; Dr.Cooke/Dr.Shah] -CT NOV  2024- improved.. Decreased size of the mixed attenuation subdural collection along the right frontal convexity, now measuring up to 12 mm, previously 17 mm. Unchanged small left frontal convexity subdural collection, measuring up to 8 mm].  As per patient further follow-ups with neurosurgery.  Clinically stable.  # Ongoing fatigue/unable to exercise: Hx of Low testosterine; on gel- recommend speaking to Dr.Bronstein re: alternative.   # Psoriasis- on MXT- Stable.  # DISPOSITION:  # follow up in 3 months - MD/labs-cbc/cmp/ldh- Dr.B  Cc; Dr.Bronstein   Encounter Diagnosis  Name Primary?   Thrombocytopenia (HCC) Yes      Earna Coder, MD 10/21/2023 2:15 PM

## 2023-10-21 NOTE — Progress Notes (Signed)
Pt in for follow up, denies any concerns today,

## 2023-10-21 NOTE — Assessment & Plan Note (Addendum)
#   Chronic thrombocytopenia isolated- > 100 [since 2015]; Likely ITP [although mild splenomegaly]; less likely from alcohol/MDS.  No bone marrow biopsy. NOV 2024- NO improvement with dex 20 mg daily x5. Currently on promacta 50 mg/day. Goal > 100 [subdural hematoma]  Today platelets 136- continue promacta at 50 mg/day- Since consistently > 100  plan promacta every other day. Duration is currently open at this time.   # subdural hematoma-right meningeal artery embolization [needing- platelet transfusion at 80s in [facility]- Aug 2024; Dr.Cooke/Dr.Shah] -CT NOV  2024- improved.. Decreased size of the mixed attenuation subdural collection along the right frontal convexity, now measuring up to 12 mm, previously 17 mm. Unchanged small left frontal convexity subdural collection, measuring up to 8 mm].  As per patient further follow-ups with neurosurgery.  Clinically stable.  # Ongoing fatigue/unable to exercise: Hx of Low testosterine; on gel- recommend speaking to Dr.Bronstein re: alternative.   # Psoriasis- on MXT- Stable.  # DISPOSITION:  # follow up in 3 months - MD/labs-cbc/cmp/ldh- Dr.B  Cc; Dr.Bronstein

## 2023-10-22 ENCOUNTER — Other Ambulatory Visit: Payer: Self-pay

## 2023-10-25 ENCOUNTER — Other Ambulatory Visit: Payer: Self-pay

## 2023-10-28 ENCOUNTER — Other Ambulatory Visit: Payer: Self-pay

## 2023-10-30 ENCOUNTER — Other Ambulatory Visit: Payer: Self-pay

## 2023-11-06 ENCOUNTER — Other Ambulatory Visit: Payer: Self-pay

## 2023-11-06 NOTE — Progress Notes (Signed)
 Specialty Pharmacy Refill Coordination Note  [name] is a 89 y.o. male contacted today regarding refills of specialty medication(s) Eltrombopag [facility])   Patient requested (Patient-Rptd) Delivery   Delivery date: (Patient-Rptd) 11/21/23   Verified address: (Patient-Rptd) [address] [place], [place]   Medication will be filled on 03.19.25.

## 2023-11-14 ENCOUNTER — Other Ambulatory Visit (HOSPITAL_COMMUNITY): Payer: Self-pay

## 2023-11-14 NOTE — Progress Notes (Signed)
 Specialty Pharmacy Ongoing Clinical Assessment Note  [name] is a 89 y.o. male who is being followed by the specialty pharmacy service for RxSp Oncology   Patient's specialty medication(s) reviewed today: Eltrombopag Olamine (PROMACTA)   Missed doses in the last 4 weeks: 0   Patient/Caregiver did not have any additional questions or concerns.   Therapeutic benefit summary: Patient is achieving benefit   Adverse events/side effects summary: Experienced adverse events/side effects (dizziness and loss of balance, will discuss with the MD)   Patient's therapy is appropriate to: Continue    Goals Addressed             This Visit's Progress    Laboratory improvement       Patient is on track. Patient will maintain adherence and adhere to provider and/or lab appointments.  Platelets were 126 on 10/21/23.          Follow up:  3 months  Servando Snare Specialty Pharmacist

## 2023-11-20 ENCOUNTER — Other Ambulatory Visit: Payer: Self-pay

## 2023-12-11 ENCOUNTER — Other Ambulatory Visit: Payer: Self-pay

## 2023-12-12 ENCOUNTER — Encounter: Payer: Self-pay | Admitting: Urology

## 2023-12-12 ENCOUNTER — Ambulatory Visit: Payer: Self-pay | Admitting: Urology

## 2023-12-12 VITALS — BP 128/65 | HR 83 | Ht 70.0 in | Wt 165.0 lb

## 2023-12-12 DIAGNOSIS — N401 Enlarged prostate with lower urinary tract symptoms: Secondary | ICD-10-CM

## 2023-12-12 DIAGNOSIS — R350 Frequency of micturition: Secondary | ICD-10-CM | POA: Diagnosis not present

## 2023-12-12 DIAGNOSIS — N200 Calculus of kidney: Secondary | ICD-10-CM

## 2023-12-12 LAB — BLADDER SCAN AMB NON-IMAGING: Scan Result: 5

## 2023-12-12 MED ORDER — SILODOSIN 8 MG PO CAPS: 8.0000 mg | ORAL_CAPSULE | Freq: Every day | ORAL | 3 refills | Status: DC

## 2023-12-12 MED ORDER — FINASTERIDE 5 MG PO TABS: 5.0000 mg | ORAL_TABLET | Freq: Every day | ORAL | 3 refills | Status: AC

## 2023-12-12 NOTE — Progress Notes (Signed)
 I, Maysun Jamey Mccallum, acting as a scribe for Geraline Knapp, MD., have documented all relevant documentation on the behalf of Geraline Knapp, MD, as directed by Geraline Knapp, MD while in the presence of Geraline Knapp, MD.  12/12/2023 2:07 PM   [name] 1934-06-28 [id number]  Referring provider: Rory Collard, MD [phone] [place],  [place] [address]  Chief Complaint  Patient presents with   Benign Prostatic Hypertrophy   Urologic history: 1.  BPH with LUTS Finasteride; tamsulosin discontinued 10/2019 due to orthostatic symptoms Cystoscopy 02/2018 elevated bladder neck, trilobar enlargement Added silodosin to finasteride in 07/2022 after increased urinary symptoms  HPI: [name] is a 89 y.o. male presents for annual follow-up.   Stable lower urinary tract symptoms since last year's visit. Remains on silodosin/finasteride Denies dysuria, gross hematuria No flank, abdominal, or pelvic pain.   PMH: 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, 12/26/10, 06/21/08, 04/20/06, 04/11/03, 01/05/03   Cancer (HCC)    melanoma   Cardiac arrhythmia    Cataract    Chicken pox    Colon polyp 12/29/10, 04/18/06, 01/05/03   Diplopia    Dysrhythmia    Essential hypertension    Exotropia, left eye    GERD (gastroesophageal reflux disease)    Gout    H/O: CVA (cerebrovascular accident)    Hemorrhoid    Hepatic cyst    History of kidney stones    Low testosterone    Melanoma in situ (HCC)    left ear, right cheek   Migraine    Near syncope    Nephrolithiasis    Nephrolithiasis    Occlusion and stenosis of vertebral artery    Psoriasis    Reflux    Splenomegaly    Strabismus    Subclavian steal syndrome    Syncope and collapse    Thrombocytopenia (HCC)    Ventricular premature depolarization    Vertebral artery stenosis     Surgical History: Past Surgical History:   Procedure Laterality Date   APPENDECTOMY     COLONOSCOPY     COLONOSCOPY WITH PROPOFOL N/A 03/13/2016   Procedure: COLONOSCOPY WITH PROPOFOL;  Surgeon: Deveron Fly, MD;  Location: [facility] ENDOSCOPY;  Service: Endoscopy;  Laterality: N/A;   diplopia     ESOPHAGOGASTRODUODENOSCOPY  08/2014, 06/14/2012   ESOPHAGOGASTRODUODENOSCOPY (EGD) WITH PROPOFOL N/A 03/13/2016   Procedure: ESOPHAGOGASTRODUODENOSCOPY (EGD) WITH PROPOFOL;  Surgeon: Deveron Fly, MD;  Location: [facility] ENDOSCOPY;  Service: Endoscopy;  Laterality: N/A;   ESOPHAGOGASTRODUODENOSCOPY (EGD) WITH PROPOFOL N/A 07/15/2018   Procedure: ESOPHAGOGASTRODUODENOSCOPY (EGD) WITH PROPOFOL;  Surgeon: Deveron Fly, MD;  Location: [facility] ENDOSCOPY;  Service: Endoscopy;  Laterality: N/A;   EYE SURGERY  08/16/2003   eye sug   MOHS SURGERY  2011, 2012   left ear and right cheek   STRABISMUS SURGERY  10/20/2015   2 horizontal muscles-left   TONSILLECTOMY     WISDOM TOOTH EXTRACTION      Home Medications:  Allergies as of 12/12/2023       Reactions   Levofloxacin    Joint pain  Tendonitis Other Reaction(s): Other (See Comments) Joint pain  Tendonitis   Other Diarrhea   tarragon        Medication List        Accurate as of December 12, 2023  2:07 PM. If you have  any questions, ask your nurse or doctor.          clobetasol ointment 0.05 % Commonly known as: TEMOVATE Apply 1 Application topically daily.   donepezil 5 MG tablet Commonly known as: ARICEPT Take 1 tablet by mouth at bedtime.   ezetimibe 10 MG tablet Commonly known as: ZETIA Take 1 tablet (10 mg total) by mouth daily.   finasteride 5 MG tablet Commonly known as: PROSCAR Take 1 tablet (5 mg total) by mouth daily.   folic acid 1 MG tablet Commonly known as: FOLVITE Take 3 mg by mouth daily.   ketoconazole 2 % shampoo Commonly known as: NIZORAL Apply 1 Application topically every other day.   methotrexate 2.5 MG tablet Commonly known as:  RHEUMATREX Take 10 mg by mouth once a week.   multivitamin with minerals tablet Take 1 tablet by mouth daily.   omeprazole 20 MG capsule Commonly known as: PRILOSEC Take 20 mg by mouth daily.   Promacta 50 MG tablet Generic drug: eltrombopag Take 1 tablet (50 mg total) by mouth daily. Take on an empty stomach 1 hour before a meal or 2 hours after What changed:  when to take this additional instructions   silodosin 8 MG Caps capsule Commonly known as: RAPAFLO Take 1 capsule (8 mg total) by mouth daily with breakfast.   Testosterone 1.62 % Gel Apply 3 Pump topically daily.   valACYclovir 1000 MG tablet Commonly known as: VALTREX Take 500 mg by mouth daily.        Allergies:  Allergies  Allergen Reactions   Levofloxacin     Joint pain   Tendonitis  Other Reaction(s): Other (See Comments)  Joint pain  Tendonitis   Other Diarrhea    tarragon    Family History: Family History  Problem Relation Age of Onset   Heart failure Mother    Osteoporosis Mother    Stroke Mother    Heart attack Father    Gout Father    Diabetes Mellitus II Father    Renal Disease Father    Alcohol abuse Father    Osteoporosis Maternal Grandfather    Osteoporosis Maternal Grandmother     Social History:  reports that he has never smoked. He has never used smokeless tobacco. He reports current alcohol use. He reports that he does not use drugs.   Physical Exam: BP 128/65   Pulse 83   Ht 5\' 10"  (1.778 m)   Wt 165 lb (74.8 kg)   BMI 23.68 kg/m   Constitutional:  Alert and oriented, No acute distress. HEENT: [place] AT Respiratory: Normal respiratory effort, no increased work of breathing. Psychiatric: Normal mood and affect.   Assessment & Plan:    1. BPH with LUTS Stable lower urinary tract symptoms Silodosin/finasteride refilled  PVR today 5 mL Continue annual follow-up with PVR.  I have reviewed the above documentation for accuracy and completeness, and I agree with the  above.   Geraline Knapp, MD  [facility] [address] [place], [place] [address] [phone]

## 2023-12-13 ENCOUNTER — Other Ambulatory Visit: Payer: Self-pay

## 2023-12-16 ENCOUNTER — Other Ambulatory Visit: Payer: Self-pay

## 2024-01-29 ENCOUNTER — Other Ambulatory Visit (HOSPITAL_COMMUNITY): Payer: Self-pay

## 2024-01-29 ENCOUNTER — Inpatient Hospital Stay: Payer: Medicare PPO | Admitting: Internal Medicine

## 2024-01-29 ENCOUNTER — Telehealth: Payer: Self-pay | Admitting: Pharmacy Technician

## 2024-01-29 ENCOUNTER — Inpatient Hospital Stay: Payer: Medicare PPO | Attending: Internal Medicine

## 2024-01-29 ENCOUNTER — Other Ambulatory Visit: Payer: Self-pay

## 2024-01-29 ENCOUNTER — Encounter: Payer: Self-pay | Admitting: Internal Medicine

## 2024-01-29 VITALS — BP 143/64 | HR 64 | Temp 97.8°F | Resp 16 | Ht 70.0 in | Wt 173.0 lb

## 2024-01-29 DIAGNOSIS — Z79899 Other long term (current) drug therapy: Secondary | ICD-10-CM | POA: Insufficient documentation

## 2024-01-29 DIAGNOSIS — D693 Immune thrombocytopenic purpura: Secondary | ICD-10-CM | POA: Insufficient documentation

## 2024-01-29 DIAGNOSIS — D696 Thrombocytopenia, unspecified: Secondary | ICD-10-CM

## 2024-01-29 DIAGNOSIS — Z79890 Hormone replacement therapy (postmenopausal): Secondary | ICD-10-CM | POA: Insufficient documentation

## 2024-01-29 DIAGNOSIS — Z79631 Long term (current) use of antimetabolite agent: Secondary | ICD-10-CM | POA: Diagnosis not present

## 2024-01-29 DIAGNOSIS — Z79624 Long term (current) use of inhibitors of nucleotide synthesis: Secondary | ICD-10-CM | POA: Insufficient documentation

## 2024-01-29 LAB — CBC WITH DIFFERENTIAL (CANCER CENTER ONLY)
Abs Immature Granulocytes: 0.02 10*3/uL (ref 0.00–0.07)
Basophils Absolute: 0 10*3/uL (ref 0.0–0.1)
Basophils Relative: 0 %
Eosinophils Absolute: 0.1 10*3/uL (ref 0.0–0.5)
Eosinophils Relative: 3 %
HCT: 45.4 % (ref 39.0–52.0)
Hemoglobin: 15.6 g/dL (ref 13.0–17.0)
Immature Granulocytes: 0 %
Lymphocytes Relative: 47 %
Lymphs Abs: 2.2 10*3/uL (ref 0.7–4.0)
MCH: 32.7 pg (ref 26.0–34.0)
MCHC: 34.4 g/dL (ref 30.0–36.0)
MCV: 95.2 fL (ref 80.0–100.0)
Monocytes Absolute: 0.3 10*3/uL (ref 0.1–1.0)
Monocytes Relative: 6 %
Neutro Abs: 2.1 10*3/uL (ref 1.7–7.7)
Neutrophils Relative %: 44 %
Platelet Count: 80 10*3/uL — ABNORMAL LOW (ref 150–400)
RBC: 4.77 MIL/uL (ref 4.22–5.81)
RDW: 14 % (ref 11.5–15.5)
WBC Count: 4.7 10*3/uL (ref 4.0–10.5)
nRBC: 0 % (ref 0.0–0.2)

## 2024-01-29 LAB — CMP (CANCER CENTER ONLY)
ALT: 26 U/L (ref 0–44)
AST: 29 U/L (ref 15–41)
Albumin: 4.1 g/dL (ref 3.5–5.0)
Alkaline Phosphatase: 56 U/L (ref 38–126)
Anion gap: 8 (ref 5–15)
BUN: 21 mg/dL (ref 8–23)
CO2: 27 mmol/L (ref 22–32)
Calcium: 8.4 mg/dL — ABNORMAL LOW (ref 8.9–10.3)
Chloride: 105 mmol/L (ref 98–111)
Creatinine: 1.14 mg/dL (ref 0.61–1.24)
GFR, Estimated: >60 mL/min (ref >60)
Glucose, Bld: 131 mg/dL — ABNORMAL HIGH (ref 70–99)
Potassium: 3.9 mmol/L (ref 3.5–5.1)
Sodium: 140 mmol/L (ref 135–145)
Total Bilirubin: 1.3 mg/dL — ABNORMAL HIGH (ref 0.0–1.2)
Total Protein: 6.5 g/dL (ref 6.5–8.1)

## 2024-01-29 LAB — LACTATE DEHYDROGENASE: LDH: 160 U/L (ref 98–192)

## 2024-01-29 MED ORDER — ELTROMBOPAG OLAMINE 50 MG PO TABS
50.0000 mg | ORAL_TABLET | Freq: Every day | ORAL | 1 refills | Status: DC
  Filled 2024-01-29: qty 30, 30d supply, fill #0
  Filled 2024-02-24: qty 30, 30d supply, fill #1
  Filled 2024-03-24: qty 30, 30d supply, fill #2
  Filled 2024-04-17 – 2024-05-05 (×2): qty 30, 30d supply, fill #3

## 2024-01-29 NOTE — Telephone Encounter (Signed)
 Oral Oncology Patient Advocate Encounter   Received notification from [facility] that prior authorization renewal for Promacta  is required.   PA submitted on 01/29/2024 Key [id number] Status is pending     [name] ([name]) [name], CPhT  [facility], [place], [place], [place] Oral Chemotherapy Patient Advocate Phone: [phone]  Fax: [phone]

## 2024-01-29 NOTE — Assessment & Plan Note (Addendum)
#   Chronic thrombocytopenia isolated- > 100 [since 2015]; Likely ITP [although mild splenomegaly]; less likely from alcohol/MDS.  No bone marrow biopsy. NOV 2024- NO improvement with dex 20 mg daily x5. Currently on promacta .  Goal > 100 [subdural hematoma]  Today platelets in 80- promacta  at 50 mg/ every other day- Since < 100  plan promacta  every day. Duration is currently open at this time.   # subdural hematoma-right meningeal artery embolization [needing- platelet transfusion at 80s in [facility]- Aug 2024; Dr.Cooke/Dr.Shah] -CT NOV  2024- improved.- As per patient further follow-ups with neurosurgery.  Clinically stable.  # Psoriasis- on MXT- Stable.  # DISPOSITION:  # follow up in 4 months - MD/labs-cbc/cmp/ldh- Dr.B  Cc; Dr.Bronstein

## 2024-01-29 NOTE — Progress Notes (Signed)
 Easy bruising: NO Petechiae (bleeding under skin): NO  Gingival bleeding (gums): NO  Epistaxis (nose bleeds): NO Hematochezia (blood in stools): NO Hematuria (blood in urine): NO  Refill promacta , pended.  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 Tresa Frohlich.

## 2024-01-29 NOTE — Progress Notes (Signed)
 Specialty Pharmacy Refill Coordination Note  [name] is a 89 y.o. male contacted today regarding refills of specialty medication(s) Eltrombopag  Olamine (PROMACTA )   Patient requested Delivery   Delivery date: 01/31/24   Verified address: [address], [place], [address]   Medication will be filled on 01/30/24, pending PA approval.     Routed to [place] for PA.

## 2024-01-29 NOTE — Progress Notes (Signed)
 [facility] CONSULT NOTE  Patient Care Team: Kenneth Collard, MD as PCP - General (Family Medicine) Kenneth Monday Deadra Everts, MD as Consulting Physician (Cardiology) Kenneth Collard, MD (Family Medicine) Kenneth Leos, MD as Consulting Physician (Oncology)  CHIEF COMPLAINTS/PURPOSE OF CONSULTATION: Thrombocytopenia  # 2013- CHRONIC ISOLATED THROMBOCYTOPENIA- 130-99; May 2017- 115 US - 2015- MILD splenomegaly; liver Elastography- Dr.Skulskie-N/ no cirrhosis; hepatitis B/C- NEG; Monoclonal work up-NEG- NOV 2024Alice Ball hemorrhage]- Patient s/p Dex- no improvement noted; November 2024-Promacta  50 mg/day [given brain ]  # On testosterone supplementation; 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  Alone.  [name] 89 y.o.  male here for follow-up because of his thrombocytopenia clinically ITP; s/p  with subdural hematoma [s/p fall] on promacta  50 mg every other day is here for a follow up.  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 Tresa Frohlich.   Patient had a recent CT scan brain in in December 2024 noted to have significant improvement of his brain hemorrhage.  Denies any head aches. Patient has appt with neurosurgery this afternoon.    He denies any bleeding episodes, stool changes, melena stools or constipation.  No other pains. He continues to be physically active, but no cycling sec to recent fall.    Review of Systems  Constitutional:  Negative for chills, diaphoresis, fever, malaise/fatigue and weight loss.  HENT:  Negative for nosebleeds and sore throat.   Eyes:  Negative for double vision.  Respiratory:  Negative for cough, hemoptysis, sputum production, shortness of breath and wheezing.   Cardiovascular:  Negative for chest pain, palpitations, orthopnea and leg swelling.  Gastrointestinal:  Negative for abdominal pain, blood in stool, constipation,  diarrhea, heartburn, melena, nausea and vomiting.  Genitourinary:  Negative for dysuria, frequency and urgency.  Musculoskeletal:  Negative for back pain and joint pain.  Skin: Negative.  Negative for itching and rash.  Neurological:  Negative for dizziness, tingling, focal weakness, weakness and headaches.  Endo/Heme/Allergies:  Does not bruise/bleed easily.  Psychiatric/Behavioral:  Negative for depression. The patient is not nervous/anxious and does not have insomnia.      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, 12/26/10, 06/21/08, 04/20/06, 04/11/03, 01/05/03   Cancer (HCC)    melanoma   Cardiac arrhythmia    Cataract    Chicken pox    Colon polyp 12/29/10, 04/18/06, 01/05/03   Diplopia    Dysrhythmia    Essential hypertension    Exotropia, left eye    GERD (gastroesophageal reflux disease)    Gout    H/O: CVA (cerebrovascular accident)    Hemorrhoid    Hepatic cyst    History of kidney stones    Low testosterone    Melanoma in situ (HCC)    left ear, right cheek   Migraine    Near syncope    Nephrolithiasis    Nephrolithiasis    Occlusion and stenosis of vertebral artery    Psoriasis    Reflux    Splenomegaly    Strabismus    Subclavian steal syndrome    Syncope and collapse    Thrombocytopenia (HCC)    Ventricular premature depolarization    Vertebral artery stenosis     SURGICAL HISTORY: Past Surgical History:  Procedure Laterality Date   APPENDECTOMY     COLONOSCOPY  COLONOSCOPY WITH PROPOFOL  N/A 03/13/2016   Procedure: COLONOSCOPY WITH PROPOFOL ;  Surgeon: Deveron Fly, MD;  Location: [facility] ENDOSCOPY;  Service: Endoscopy;  Laterality: N/A;   diplopia     ESOPHAGOGASTRODUODENOSCOPY  08/2014, 06/14/2012   ESOPHAGOGASTRODUODENOSCOPY (EGD) WITH PROPOFOL  N/A 03/13/2016   Procedure: ESOPHAGOGASTRODUODENOSCOPY (EGD) WITH PROPOFOL ;  Surgeon: Deveron Fly, MD;  Location: [facility] ENDOSCOPY;  Service: Endoscopy;  Laterality: N/A;   ESOPHAGOGASTRODUODENOSCOPY (EGD) WITH PROPOFOL  N/A 07/15/2018   Procedure: ESOPHAGOGASTRODUODENOSCOPY (EGD) WITH PROPOFOL ;  Surgeon: Deveron Fly, MD;  Location[facility] ENDOSCOPY;  Service: Endoscopy;  Laterality: N/A;   EYE SURGERY  08/16/2003   eye sug   MOHS SURGERY  2011, 2012   left ear and right cheek   STRABISMUS SURGERY  10/20/2015   2 horizontal muscles-left   TONSILLECTOMY     WISDOM TOOTH EXTRACTION      SOCIAL HISTORY: Social History   Socioeconomic History   Marital status: Married    Spouse name: Not on file   Number of children: Not on file   Years of education: Not on file   Highest education level: Not on file  Occupational History   Not on file  Tobacco Use   Smoking status: Never   Smokeless tobacco: Never  Vaping Use   Vaping status: Never Used  Substance and Sexual Activity   Alcohol use: Yes    Comment: social drinker   Drug use: Never   Sexual activity: Yes  Other Topics Concern   Not on file  Social History Narrative   ** Merged History Encounter **        Lives in [place]; no smoking; 4-5 cocktails a week; 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   Social Drivers of Corporate investment banker Strain: Patient Declined (05/03/2023)   Received from [facility]   Overall Financial Resource Strain (CARDIA)    Difficulty of Paying Living Expenses: Patient declined  Recent Concern: Physicist, medical Strain - High Risk (04/28/2023)   Received from [facility]   Overall Financial Resource Strain (CARDIA)    Difficulty of Paying Living Expenses: Very hard  Food Insecurity: Patient Declined (05/03/2023)   Received from [facility]   Hunger Vital Sign    Worried About Running Out of Food in the Last Year: Patient declined    Ran Out of Food in the Last Year: Patient declined  Transportation  Needs: Patient Declined (05/03/2023)   Received from [facility]   PRAPARE - Transportation    In the past 12 months, has lack of transportation kept you from medical appointments or from getting medications?: Patient declined    Lack of Transportation (Non-Medical): Patient declined  Physical Activity: Not on file  Stress: Not on file  Social Connections: Not on file  Intimate Partner Violence: Not on file    FAMILY HISTORY: Family History  Problem Relation Age of Onset   Heart failure Mother    Osteoporosis Mother    Stroke Mother    Heart attack Father    Gout Father    Diabetes Mellitus II Father    Renal Disease Father    Alcohol abuse Father    Osteoporosis Maternal Grandfather    Osteoporosis Maternal Grandmother     ALLERGIES:  is allergic to levofloxacin  and other.  MEDICATIONS:  Current Outpatient Medications  Medication Sig Dispense Refill   clobetasol ointment (TEMOVATE) 0.05 % Apply 1 Application topically daily.  donepezil (ARICEPT) 5 MG tablet Take 1 tablet by mouth at bedtime.     ezetimibe  (ZETIA ) 10 MG tablet Take 1 tablet (10 mg total) by mouth daily. 90 tablet 3   finasteride  (PROSCAR ) 5 MG tablet Take 1 tablet (5 mg total) by mouth daily. 90 tablet 3   folic acid  (FOLVITE ) 1 MG tablet Take 3 mg by mouth daily.     ketoconazole (NIZORAL) 2 % shampoo Apply 1 Application topically every other day.     methotrexate  (RHEUMATREX) 2.5 MG tablet Take 10 mg by mouth once a week.     Multiple Vitamins-Minerals (MULTIVITAMIN WITH MINERALS) tablet Take 1 tablet by mouth daily.     omeprazole (PRILOSEC) 20 MG capsule Take 20 mg by mouth daily.     silodosin  (RAPAFLO ) 8 MG CAPS capsule Take 1 capsule (8 mg total) by mouth daily with breakfast. 90 capsule 3   Testosterone 1.62 % GEL Apply 3 Pump topically daily.     valACYclovir (VALTREX) 1000 MG tablet Take 500 mg by mouth daily.     eltrombopag  (PROMACTA ) 50 MG tablet Take 1 tablet (50 mg total)  by mouth daily. Take on an empty stomach 1 hour before a meal or 2 hours after 90 tablet 1   No current facility-administered medications for this visit.      Aaron Aas  PHYSICAL EXAMINATION:   Vitals:   01/29/24 0927  BP: (!) 143/64  Pulse: 64  Resp: 16  Temp: 97.8 F (36.6 C)  SpO2: 100%     Filed Weights   01/29/24 0927  Weight: 173 lb (78.5 kg)      Physical Exam HENT:     Head: Normocephalic and atraumatic.     Mouth/Throat:     Pharynx: No oropharyngeal exudate.  Eyes:     Pupils: Pupils are equal, round, and reactive to light.  Cardiovascular:     Rate and Rhythm: Normal rate and regular rhythm.  Pulmonary:     Effort: No respiratory distress.     Breath sounds: No wheezing.  Abdominal:     General: Bowel sounds are normal. There is no distension.     Palpations: Abdomen is soft. There is no mass.     Tenderness: There is no abdominal tenderness. There is no guarding or rebound.  Musculoskeletal:        General: No tenderness. Normal range of motion.     Cervical back: Normal range of motion and neck supple.  Skin:    General: Skin is warm.  Neurological:     Mental Status: He is alert and oriented to person, place, and time.  Psychiatric:        Mood and Affect: Affect normal.      LABORATORY DATA:  I have reviewed the data as listed Lab Results  Component Value Date   WBC 4.7 01/29/2024   HGB 15.6 01/29/2024   HCT 45.4 01/29/2024   MCV 95.2 01/29/2024   PLT 80 (L) 01/29/2024   Recent Labs    08/23/23 1247 10/21/23 1333 01/29/24 0929  NA 140 142 140  K 4.3 3.7 3.9  CL 103 107 105  CO2 27 28 27   GLUCOSE 107* 151* 131*  BUN 18 17 21   CREATININE 1.10 1.05 1.14  CALCIUM  9.1 8.4* 8.4*  GFRNONAA >60 >60 >60  PROT 7.2 7.0 6.5  ALBUMIN 4.5 4.4 4.1  AST 36 27 29  ALT 37 25 26  ALKPHOS 53 61 56  BILITOT 0.9 0.6 1.3*  ASSESSMENT & PLAN:   Thrombocytopenia (HCC) # Chronic thrombocytopenia isolated- > 100 [since 2015]; Likely ITP  [although mild splenomegaly]; less likely from alcohol/MDS.  No bone marrow biopsy. NOV 2024- NO improvement with dex 20 mg daily x5. Currently on promacta .  Goal > 100 [subdural hematoma]  Today platelets in 80- promacta  at 50 mg/ every other day- Since < 100  plan promacta  every day. Duration is currently open at this time.   # subdural hematoma-right meningeal artery embolization [needing- platelet transfusion at 80s in [facility]- Aug 2024; Dr.Cooke/Dr.Shah] -CT NOV  2024- improved.- As per patient further follow-ups with neurosurgery.  Clinically stable.  # Psoriasis- on MXT- Stable.  # DISPOSITION:  # follow up in 4 months - MD/labs-cbc/cmp/ldh- Dr.B  Cc; Dr.Bronstein   Encounter Diagnosis  Name Primary?   Thrombocytopenia (HCC) Yes      Kenneth Leos, MD 01/29/2024 10:33 AM

## 2024-01-30 ENCOUNTER — Telehealth: Payer: Self-pay | Admitting: Pharmacy Technician

## 2024-01-30 ENCOUNTER — Other Ambulatory Visit (HOSPITAL_COMMUNITY): Payer: Self-pay

## 2024-01-30 ENCOUNTER — Other Ambulatory Visit: Payer: Self-pay

## 2024-01-30 NOTE — Telephone Encounter (Signed)
 Oral Oncology Patient Advocate Encounter  Prior Authorization for brand Promacta  has been approved.    PA# [id number] Effective dates: 01/30/2024 through 07/28/2024  Patient can continue filling at [facility].   Brylin Stanislawski ([name]) [name], CPhT  [facility], [place], [place], [place] Oral Chemotherapy Patient Advocate Phone: [phone]  Fax: [phone]

## 2024-01-30 NOTE — Telephone Encounter (Signed)
 Oral Oncology Patient Advocate Encounter   Received notification that prior authorization for brand Promacta  is required.   PA submitted on 01/30/2024 Key [id number] Status is pending     [name] ([name]) [name], CPhT  [facility], [place], [place], [place] Oral Chemotherapy Patient Advocate Phone: [phone]  Fax: [phone]

## 2024-01-30 NOTE — Telephone Encounter (Signed)
 Oral Oncology Patient Advocate Encounter  Received notification that the request for prior authorization for eltrombopag  has been denied due to not being formulary.     There was an issue submitting the request through CoverMyMeds which defaulted my selection to the generic instead of the brand.  Resubmitting PA for only brand. Please see new encounter.   Roiza Wiedel ([name]) [name], CPhT  [facility], [place], [place], [place] Oral Chemotherapy Patient Advocate Phone: [phone]  Fax: [phone]

## 2024-02-19 ENCOUNTER — Other Ambulatory Visit: Payer: Self-pay

## 2024-02-24 ENCOUNTER — Other Ambulatory Visit: Payer: Self-pay | Admitting: Pharmacy Technician

## 2024-02-24 ENCOUNTER — Other Ambulatory Visit: Payer: Self-pay

## 2024-02-24 NOTE — Progress Notes (Signed)
 Specialty Pharmacy Refill Coordination Note  [name] is a 89 y.o. male contacted today regarding refills of specialty medication(s) Eltrombopag  Olamine (PROMACTA )  Spoke with Wife  Patient requested Delivery   Delivery date: 02/27/24   Verified address: [address] [place]   Medication will be filled on 02/26/24.

## 2024-02-25 ENCOUNTER — Other Ambulatory Visit: Payer: Self-pay

## 2024-02-25 NOTE — Progress Notes (Signed)
 Specialty Pharmacy Ongoing Clinical Assessment Note  [name] is a 89 y.o. male who is being followed by the specialty pharmacy service for RxSp Oncology   Patient's specialty medication(s) reviewed today: Eltrombopag  Olamine (PROMACTA )   Missed doses in the last 4 weeks: 0   Patient/Caregiver did not have any additional questions or concerns.   Therapeutic benefit summary: Patient is achieving benefit   Adverse events/side effects summary: No adverse events/side effects   Patient's therapy is appropriate to: Continue    Goals Addressed             This Visit's Progress    Laboratory improvement       Patient is on track. Patient will maintain adherence and adhere to provider and/or lab appointments.  Platelets were 80 on 01/29/24 so provider increased Promacta  to daily at that time.           Follow up: 3 months  [name] M Sylus Stgermain Specialty Pharmacist

## 2024-03-24 ENCOUNTER — Other Ambulatory Visit: Payer: Self-pay

## 2024-03-24 NOTE — Progress Notes (Signed)
 Specialty Pharmacy Refill Coordination Note  [name] is a 89 y.o. male contacted today regarding refills of specialty medication(s) Eltrombopag  Olamine (PROMACTA )   Patient requested Delivery   Delivery date: 03/27/24   Verified address: [address] [place]   Medication will be filled on 03/26/24.

## 2024-03-25 ENCOUNTER — Other Ambulatory Visit: Payer: Self-pay

## 2024-04-14 ENCOUNTER — Other Ambulatory Visit: Payer: Self-pay

## 2024-04-17 ENCOUNTER — Other Ambulatory Visit: Payer: Self-pay

## 2024-04-20 ENCOUNTER — Other Ambulatory Visit: Payer: Self-pay

## 2024-05-01 ENCOUNTER — Other Ambulatory Visit: Payer: Self-pay

## 2024-05-05 ENCOUNTER — Other Ambulatory Visit: Payer: Self-pay

## 2024-05-05 NOTE — Progress Notes (Signed)
 Specialty Pharmacy Refill Coordination Note  [name] is a 89 y.o. male contacted today regarding refills of specialty medication(s) Eltrombopag  Olamine (PROMACTA )   Patient requested Delivery   Delivery date: 05/19/24   Verified address: [address]  [place]   Medication will be filled on 05/18/24.

## 2024-05-12 ENCOUNTER — Other Ambulatory Visit: Payer: Self-pay

## 2024-05-14 ENCOUNTER — Other Ambulatory Visit: Payer: Self-pay | Admitting: Cardiovascular Disease

## 2024-05-18 ENCOUNTER — Other Ambulatory Visit: Payer: Self-pay

## 2024-05-18 NOTE — Progress Notes (Signed)
 Specialty Pharmacy Ongoing Clinical Assessment Note  [name] is a 89 y.o. male who is being followed by the specialty pharmacy service for RxSp Oncology   Patient's specialty medication(s) reviewed today: Eltrombopag  Olamine (PROMACTA )   Missed doses in the last 4 weeks: 0   Patient/Caregiver did not have any additional questions or concerns.   Therapeutic benefit summary: Patient is achieving benefit   Adverse events/side effects summary: No adverse events/side effects   Patient's therapy is appropriate to: Continue    Goals Addressed             This Visit's Progress    Laboratory improvement   On track    Patient is on track. Patient will maintain adherence and adhere to provider and/or lab appointments.  Platelets were 80 on 01/29/24 so provider increased Promacta  to daily at that time.           Follow up: 6 months  Silvano LOISE Dolly Specialty Pharmacist

## 2024-06-01 ENCOUNTER — Inpatient Hospital Stay: Attending: Internal Medicine

## 2024-06-01 ENCOUNTER — Other Ambulatory Visit: Payer: Self-pay

## 2024-06-01 ENCOUNTER — Encounter: Payer: Self-pay | Admitting: Internal Medicine

## 2024-06-01 ENCOUNTER — Other Ambulatory Visit (HOSPITAL_COMMUNITY): Payer: Self-pay

## 2024-06-01 ENCOUNTER — Inpatient Hospital Stay: Admitting: Internal Medicine

## 2024-06-01 VITALS — BP 136/69 | HR 85 | Temp 97.8°F | Resp 16 | Ht 70.0 in | Wt 168.0 lb

## 2024-06-01 DIAGNOSIS — Z79899 Other long term (current) drug therapy: Secondary | ICD-10-CM | POA: Diagnosis not present

## 2024-06-01 DIAGNOSIS — D693 Immune thrombocytopenic purpura: Secondary | ICD-10-CM | POA: Insufficient documentation

## 2024-06-01 DIAGNOSIS — Z79631 Long term (current) use of antimetabolite agent: Secondary | ICD-10-CM | POA: Insufficient documentation

## 2024-06-01 DIAGNOSIS — D696 Thrombocytopenia, unspecified: Secondary | ICD-10-CM | POA: Diagnosis not present

## 2024-06-01 DIAGNOSIS — Z79890 Hormone replacement therapy (postmenopausal): Secondary | ICD-10-CM | POA: Diagnosis not present

## 2024-06-01 DIAGNOSIS — Z79624 Long term (current) use of inhibitors of nucleotide synthesis: Secondary | ICD-10-CM | POA: Insufficient documentation

## 2024-06-01 DIAGNOSIS — Z860101 Personal history of adenomatous and serrated colon polyps: Secondary | ICD-10-CM | POA: Insufficient documentation

## 2024-06-01 DIAGNOSIS — I10 Essential (primary) hypertension: Secondary | ICD-10-CM | POA: Insufficient documentation

## 2024-06-01 LAB — CMP (CANCER CENTER ONLY)
ALT: 19 U/L (ref 0–44)
AST: 26 U/L (ref 15–41)
Albumin: 4.4 g/dL (ref 3.5–5.0)
Alkaline Phosphatase: 56 U/L (ref 38–126)
Anion gap: 10 (ref 5–15)
BUN: 19 mg/dL (ref 8–23)
CO2: 27 mmol/L (ref 22–32)
Calcium: 9.1 mg/dL (ref 8.9–10.3)
Chloride: 105 mmol/L (ref 98–111)
Creatinine: 1.21 mg/dL (ref 0.61–1.24)
GFR, Estimated: 58 mL/min — ABNORMAL LOW (ref >60)
Glucose, Bld: 138 mg/dL — ABNORMAL HIGH (ref 70–99)
Potassium: 4 mmol/L (ref 3.5–5.1)
Sodium: 142 mmol/L (ref 135–145)
Total Bilirubin: 1.1 mg/dL (ref 0.0–1.2)
Total Protein: 6.8 g/dL (ref 6.5–8.1)

## 2024-06-01 LAB — CBC WITH DIFFERENTIAL (CANCER CENTER ONLY)
Abs Immature Granulocytes: 0.03 K/uL (ref 0.00–0.07)
Basophils Absolute: 0 K/uL (ref 0.0–0.1)
Basophils Relative: 1 %
Eosinophils Absolute: 0.1 K/uL (ref 0.0–0.5)
Eosinophils Relative: 3 %
HCT: 44.8 % (ref 39.0–52.0)
Hemoglobin: 15.6 g/dL (ref 13.0–17.0)
Immature Granulocytes: 1 %
Lymphocytes Relative: 41 %
Lymphs Abs: 1.7 K/uL (ref 0.7–4.0)
MCH: 33.6 pg (ref 26.0–34.0)
MCHC: 34.8 g/dL (ref 30.0–36.0)
MCV: 96.6 fL (ref 80.0–100.0)
Monocytes Absolute: 0.3 K/uL (ref 0.1–1.0)
Monocytes Relative: 6 %
Neutro Abs: 2 K/uL (ref 1.7–7.7)
Neutrophils Relative %: 48 %
Platelet Count: 109 K/uL — ABNORMAL LOW (ref 150–400)
RBC: 4.64 MIL/uL (ref 4.22–5.81)
RDW: 14 % (ref 11.5–15.5)
WBC Count: 4.1 K/uL (ref 4.0–10.5)
nRBC: 0 % (ref 0.0–0.2)

## 2024-06-01 LAB — LACTATE DEHYDROGENASE: LDH: 169 U/L (ref 98–192)

## 2024-06-01 MED ORDER — ELTROMBOPAG OLAMINE 25 MG PO TABS
25.0000 mg | ORAL_TABLET | Freq: Every day | ORAL | 1 refills | Status: AC
  Filled 2024-06-01: qty 30, 30d supply, fill #0
  Filled 2024-07-16 – 2024-07-27 (×3): qty 30, 30d supply, fill #1
  Filled 2024-09-07: qty 30, 30d supply, fill #2
  Filled 2024-09-30: qty 30, 30d supply, fill #3

## 2024-06-01 NOTE — Progress Notes (Signed)
 Specialty Pharmacy Refill Coordination Note  Spoke with Brixon Zhen is a 89 y.o. male contacted today regarding refills of specialty medication(s) Eltrombopag  Olamine (PROMACTA )  Doses on hand: 16  Patient requested: Delivery   Delivery date: 06/26/24   Verified address: [address] [place] [address]  Medication will be filled on 06/25/24.  **New dose/dose decrease. Patient was instructed by MD to take 50mg  tablet every other day. Patient should have enough tablets to last until end of October/beginning of Novemember. Patient was advised to call Specialty Pharmacy if needed sooner.**

## 2024-06-01 NOTE — Progress Notes (Signed)
 [facility] CONSULT NOTE  Patient Care Team: Glover Lenis, MD as PCP - General (Family Medicine) Perla Evalene PARAS, MD as Consulting Physician (Cardiology) Glover Lenis, MD (Family Medicine) Rennie Cindy SAUNDERS, MD as Consulting Physician (Oncology)  CHIEF COMPLAINTS/PURPOSE OF CONSULTATION: Thrombocytopenia  # 2013- CHRONIC ISOLATED THROMBOCYTOPENIA- 130-99; May 2017- 115 US - 2015- MILD splenomegaly; liver Elastography- Dr.Skulskie-N/ no cirrhosis; hepatitis B/C- NEG; Monoclonal work up-NEG- NOV 2024[place] hemorrhage]- Patient s/p Dex- no improvement noted; November 2024-Promacta  50 mg/day [given brain ]  # On testosterone supplementation; 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  Alone.  [name] 89 y.o.  male here for follow-up because of his thrombocytopenia clinically ITP; s/p  with subdural hematoma [s/p fall] on promacta  50 mg  every day is here for a follow up.  C/o diarrhea from 1-3 times per day, since being on promacta .  Denies any head aches. No further episodes of brain bleeding.   He denies any  stool changes, melena stools. No other pains. He continues to be physically active, but no cycling sec to recent fall/brain bleed.    Review of Systems  Constitutional:  Negative for chills, diaphoresis, fever, malaise/fatigue and weight loss.  HENT:  Negative for nosebleeds and sore throat.   Eyes:  Negative for double vision.  Respiratory:  Negative for cough, hemoptysis, sputum production, shortness of breath and wheezing.   Cardiovascular:  Negative for chest pain, palpitations, orthopnea and leg swelling.  Gastrointestinal:  Negative for abdominal pain, blood in stool, constipation, diarrhea, heartburn, melena, nausea and vomiting.  Genitourinary:  Negative for dysuria, frequency and urgency.  Musculoskeletal:  Negative for back pain and joint pain.  Skin: Negative.  Negative  for itching and rash.  Neurological:  Negative for dizziness, tingling, focal weakness, weakness and headaches.  Endo/Heme/Allergies:  Does not bruise/bleed easily.  Psychiatric/Behavioral:  Negative for depression. The patient is not nervous/anxious and does not have insomnia.      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, 12/26/10, 06/21/08, 04/20/06, 04/11/03, 01/05/03   Cancer (HCC)    melanoma   Cardiac arrhythmia    Cataract    Chicken pox    Colon polyp 12/29/10, 04/18/06, 01/05/03   Diplopia    Dysrhythmia    Essential hypertension    Exotropia, left eye    GERD (gastroesophageal reflux disease)    Gout    H/O: CVA (cerebrovascular accident)    Hemorrhoid    Hepatic cyst    History of kidney stones    Low testosterone    Melanoma in situ (HCC)    left ear, right cheek   Migraine    Near syncope    Nephrolithiasis    Nephrolithiasis    Occlusion and stenosis of vertebral artery    Psoriasis    Reflux    Splenomegaly    Strabismus    Subclavian steal syndrome    Syncope and collapse    Thrombocytopenia    Ventricular premature depolarization    Vertebral artery stenosis     SURGICAL HISTORY: Past Surgical History:  Procedure Laterality Date   APPENDECTOMY     COLONOSCOPY     COLONOSCOPY WITH PROPOFOL  N/A 03/13/2016   Procedure: COLONOSCOPY WITH PROPOFOL ;  Surgeon: Gladis RAYMOND Mariner, MD;  Location: [facility] ENDOSCOPY;  Service: Endoscopy;  Laterality: N/A;   diplopia  ESOPHAGOGASTRODUODENOSCOPY  08/2014, 06/14/2012   ESOPHAGOGASTRODUODENOSCOPY (EGD) WITH PROPOFOL  N/A 03/13/2016   Procedure: ESOPHAGOGASTRODUODENOSCOPY (EGD) WITH PROPOFOL ;  Surgeon: Gladis RAYMOND Mariner, MD;  Location: [facility] ENDOSCOPY;  Service: Endoscopy;  Laterality: N/A;   ESOPHAGOGASTRODUODENOSCOPY (EGD) WITH PROPOFOL  N/A 07/15/2018   Procedure: ESOPHAGOGASTRODUODENOSCOPY (EGD) WITH PROPOFOL ;   Surgeon: Mariner Gladis RAYMOND, MD;  Location[facility] ENDOSCOPY;  Service: Endoscopy;  Laterality: N/A;   EYE SURGERY  08/16/2003   eye sug   MOHS SURGERY  2011, 2012   left ear and right cheek   STRABISMUS SURGERY  10/20/2015   2 horizontal muscles-left   TONSILLECTOMY     WISDOM TOOTH EXTRACTION      SOCIAL HISTORY: Social History   Socioeconomic History   Marital status: Married    Spouse name: Not on file   Number of children: Not on file   Years of education: Not on file   Highest education level: Not on file  Occupational History   Not on file  Tobacco Use   Smoking status: Never   Smokeless tobacco: Never  Vaping Use   Vaping status: Never Used  Substance and Sexual Activity   Alcohol use: Yes    Comment: social drinker   Drug use: Never   Sexual activity: Yes  Other Topics Concern   Not on file  Social History Narrative   ** Merged History Encounter **        Lives in [place]; no smoking; 4-5 cocktails a week; 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   Social Drivers of Corporate investment banker Strain: Patient Declined (05/03/2023)   Received from [facility]   Overall Financial Resource Strain (CARDIA)    Difficulty of Paying Living Expenses: Patient declined  Recent Concern: Physicist, medical Strain - High Risk (04/28/2023)   Received from [facility]   Overall Financial Resource Strain (CARDIA)    Difficulty of Paying Living Expenses: Very hard  Food Insecurity: Patient Declined (05/03/2023)   Received from [facility]   Hunger Vital Sign    Within the past 12 months, you worried that your food would run out before you got the money to buy more.: Patient declined    Within the past 12 months, the food you bought just didn't last and you didn't have money to get more.: Patient declined  Transportation Needs: Patient Declined (05/03/2023)   Received from [facility]    PRAPARE - Transportation    In the past 12 months, has lack of transportation kept you from medical appointments or from getting medications?: Patient declined    Lack of Transportation (Non-Medical): Patient declined  Physical Activity: Not on file  Stress: Not on file  Social Connections: Not on file  Intimate Partner Violence: Not on file    FAMILY HISTORY: Family History  Problem Relation Age of Onset   Heart failure Mother    Osteoporosis Mother    Stroke Mother    Heart attack Father    Gout Father    Diabetes Mellitus II Father    Renal Disease Father    Alcohol abuse Father    Osteoporosis Maternal Grandfather    Osteoporosis Maternal Grandmother     ALLERGIES:  is allergic to levofloxacin  and other.  MEDICATIONS:  Current Outpatient Medications  Medication Sig Dispense Refill   clobetasol ointment (TEMOVATE) 0.05 % Apply 1 Application topically daily.     donepezil (ARICEPT) 5 MG tablet Take 1 tablet  by mouth at bedtime.     ezetimibe  (ZETIA ) 10 MG tablet TAKE ONE TABLET BY MOUTH EVERY DAY 30 tablet 0   finasteride  (PROSCAR ) 5 MG tablet Take 1 tablet (5 mg total) by mouth daily. 90 tablet 3   folic acid  (FOLVITE ) 1 MG tablet Take 3 mg by mouth daily.     ketoconazole (NIZORAL) 2 % shampoo Apply 1 Application topically every other day.     methotrexate  (RHEUMATREX) 2.5 MG tablet Take 10 mg by mouth once a week.     Multiple Vitamins-Minerals (MULTIVITAMIN WITH MINERALS) tablet Take 1 tablet by mouth daily.     omeprazole (PRILOSEC) 20 MG capsule Take 20 mg by mouth daily.     silodosin  (RAPAFLO ) 8 MG CAPS capsule Take 1 capsule (8 mg total) by mouth daily with breakfast. 90 capsule 3   Testosterone 1.62 % GEL Apply 3 Pump topically daily.     eltrombopag  (PROMACTA ) 25 MG tablet Take 1 tablet (25 mg total) by mouth daily. Take on an empty stomach 1 hour before a meal or 2 hours after 90 tablet 1   valACYclovir (VALTREX) 1000 MG tablet Take 500 mg by mouth daily.      No current facility-administered medications for this visit.      SABRA  PHYSICAL EXAMINATION:   Vitals:   06/01/24 0938  BP: 136/69  Pulse: 85  Resp: 16  Temp: 97.8 F (36.6 C)  SpO2: 97%     Filed Weights   06/01/24 0938  Weight: 168 lb (76.2 kg)      Physical Exam HENT:     Head: Normocephalic and atraumatic.     Mouth/Throat:     Pharynx: No oropharyngeal exudate.  Eyes:     Pupils: Pupils are equal, round, and reactive to light.  Cardiovascular:     Rate and Rhythm: Normal rate and regular rhythm.  Pulmonary:     Effort: No respiratory distress.     Breath sounds: No wheezing.  Abdominal:     General: Bowel sounds are normal. There is no distension.     Palpations: Abdomen is soft. There is no mass.     Tenderness: There is no abdominal tenderness. There is no guarding or rebound.  Musculoskeletal:        General: No tenderness. Normal range of motion.     Cervical back: Normal range of motion and neck supple.  Skin:    General: Skin is warm.  Neurological:     Mental Status: He is alert and oriented to person, place, and time.  Psychiatric:        Mood and Affect: Affect normal.      LABORATORY DATA:  I have reviewed the data as listed Lab Results  Component Value Date   WBC 4.1 06/01/2024   HGB 15.6 06/01/2024   HCT 44.8 06/01/2024   MCV 96.6 06/01/2024   PLT 109 (L) 06/01/2024   Recent Labs    08/23/23 1247 10/21/23 1333 01/29/24 0929  NA 140 142 140  K 4.3 3.7 3.9  CL 103 107 105  CO2 27 28 27   GLUCOSE 107* 151* 131*  BUN 18 17 21   CREATININE 1.10 1.05 1.14  CALCIUM  9.1 8.4* 8.4*  GFRNONAA >60 >60 >60  PROT 7.2 7.0 6.5  ALBUMIN 4.5 4.4 4.1  AST 36 27 29  ALT 37 25 26  ALKPHOS 53 61 56  BILITOT 0.9 0.6 1.3*     ASSESSMENT & PLAN:   Thrombocytopenia #  Chronic thrombocytopenia isolated- > 100 [since 2015]; Likely ITP [although mild splenomegaly]; less likely from alcohol/MDS.  No bone marrow biopsy. NOV 2024- NO  improvement with dex 20 mg daily x5. Currently on promacta .  Goal > 100 [subdural hematoma]  # Today platelets in 108  given ongoing diarrhea- recommend cutting the dose of promacta  at 25 mg- every day-plan indefinitely for now. Discussed with pharmacy.   # Diarrhea- G-1-2: sec to promacta - recommend imomdium prn.   # subdural hematoma-right meningeal artery embolization [s/p fall-needing- platelet transfusion at 80s in [facility]- Aug 2024; Dr.Cooke/Dr.Shah] -CT NOV  2024- stable.   # Psoriasis- on MXT- Stable.  # DISPOSITION:  # follow up in 4  months - MD/labs-cbc/cmp/ldh- Dr.B  Cc; Dr.Bronstein   Encounter Diagnosis  Name Primary?   Thrombocytopenia Yes      Cindy JONELLE Joe, MD 06/01/2024 10:47 AM

## 2024-06-01 NOTE — Assessment & Plan Note (Addendum)
#   Chronic thrombocytopenia isolated- > 100 [since 2015]; Likely ITP [although mild splenomegaly]; less likely from alcohol/MDS.  No bone marrow biopsy. NOV 2024- NO improvement with dex 20 mg daily x5. Currently on promacta .  Goal > 100 [subdural hematoma]  # Today platelets in 108  given ongoing diarrhea- recommend cutting the dose of promacta  at 25 mg- every day-plan indefinitely for now. Discussed with pharmacy.   # Diarrhea- G-1-2: sec to promacta - recommend imomdium prn.   # subdural hematoma-right meningeal artery embolization [s/p fall-needing- platelet transfusion at 80[facility] in [facility]- Aug 2024; Dr.Cooke/Dr.Shah] -CT NOV  2024- stable.   # Psoriasis- on MXT- Stable.  # DISPOSITION:  # follow up in 4  months - MD/labs-cbc/cmp/ldh- Dr.B  Cc; Dr.Bronstein

## 2024-06-01 NOTE — Progress Notes (Signed)
 Easy bruising: no  Petechiae (bleeding under skin): no  Gingival bleeding (gums): no  Epistaxis (nose bleeds): no  Hematochezia (blood in stools): no Hematuria (blood in urine): no  C/o diarrhea from 1-3 times per day, since being on promacta .

## 2024-06-19 ENCOUNTER — Other Ambulatory Visit: Payer: Medicare PPO

## 2024-06-19 ENCOUNTER — Ambulatory Visit: Payer: Medicare PPO | Admitting: Internal Medicine
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
## 2024-06-23 ENCOUNTER — Encounter: Payer: Self-pay | Admitting: Cardiovascular Disease

## 2024-06-23 ENCOUNTER — Ambulatory Visit: Attending: Cardiovascular Disease | Admitting: Cardiovascular Disease

## 2024-06-23 VITALS — BP 122/70 | HR 71 | Ht 70.0 in | Wt 171.0 lb

## 2024-06-23 DIAGNOSIS — I499 Cardiac arrhythmia, unspecified: Secondary | ICD-10-CM

## 2024-06-23 DIAGNOSIS — Z8679 Personal history of other diseases of the circulatory system: Secondary | ICD-10-CM

## 2024-06-23 DIAGNOSIS — I10 Essential (primary) hypertension: Secondary | ICD-10-CM | POA: Diagnosis not present

## 2024-06-23 DIAGNOSIS — I739 Peripheral vascular disease, unspecified: Secondary | ICD-10-CM | POA: Diagnosis not present

## 2024-06-23 DIAGNOSIS — G458 Other transient cerebral ischemic attacks and related syndromes: Secondary | ICD-10-CM

## 2024-06-23 DIAGNOSIS — I2584 Coronary atherosclerosis due to calcified coronary lesion: Secondary | ICD-10-CM

## 2024-06-23 DIAGNOSIS — I493 Ventricular premature depolarization: Secondary | ICD-10-CM | POA: Diagnosis not present

## 2024-06-23 DIAGNOSIS — R55 Syncope and collapse: Secondary | ICD-10-CM | POA: Diagnosis not present

## 2024-06-23 DIAGNOSIS — I6501 Occlusion and stenosis of right vertebral artery: Secondary | ICD-10-CM

## 2024-06-23 NOTE — Patient Instructions (Signed)
 Research reveal loop monitor device  Medication Instructions:  No changes  If you need a refill on your cardiac medications before your next appointment, please call your pharmacy.   Lab work: No new labs needed  Testing/Procedures: No new testing needed  Follow-Up: At [facility], you and your health needs are our priority.  As part of our continuing mission to provide you with exceptional heart care, we have created designated Provider Care Teams.  These Care Teams include your primary Cardiologist (physician) and Advanced Practice Providers (APPs -  Physician Assistants and Nurse Practitioners) who all work together to provide you with the care you need, when you need it.  You will need a follow up appointment in 12 months  Providers on your designated Care Team:   Lonni Meager, NP Bernardino Bring, PA-C Cadence [place], [place]  COVID-19 Vaccine Information can be found at: [email or web address] For questions related to vaccine distribution or appointments, please email [email or web address] or call [phone].

## 2024-07-01 ENCOUNTER — Other Ambulatory Visit: Payer: Self-pay | Admitting: Cardiovascular Disease

## 2024-07-16 ENCOUNTER — Other Ambulatory Visit: Payer: Self-pay

## 2024-07-21 ENCOUNTER — Other Ambulatory Visit: Payer: Self-pay

## 2024-07-23 ENCOUNTER — Other Ambulatory Visit (HOSPITAL_COMMUNITY): Payer: Self-pay

## 2024-07-27 ENCOUNTER — Other Ambulatory Visit (HOSPITAL_COMMUNITY): Payer: Self-pay

## 2024-07-27 ENCOUNTER — Other Ambulatory Visit: Payer: Self-pay

## 2024-07-27 NOTE — Progress Notes (Signed)
 Specialty Pharmacy Refill Coordination Note  [name] is a 89 y.o. male contacted today regarding refills of specialty medication(s) Eltrombopag  Olamine (PROMACTA )   Patient requested Delivery   Delivery date: 08/04/24   Verified address: [address] [place] [address]   Medication will be filled on: 08/03/24

## 2024-08-03 ENCOUNTER — Telehealth: Payer: Self-pay | Admitting: Pharmacy Technician

## 2024-08-03 ENCOUNTER — Other Ambulatory Visit (HOSPITAL_COMMUNITY): Payer: Self-pay

## 2024-08-03 ENCOUNTER — Other Ambulatory Visit: Payer: Self-pay | Admitting: Pharmacy Technician

## 2024-08-03 ENCOUNTER — Other Ambulatory Visit: Payer: Self-pay

## 2024-08-03 NOTE — Progress Notes (Signed)
 Benefits Investigation Started  Reason: Prior Authorization Required  Routed to: [facility]

## 2024-08-03 NOTE — Telephone Encounter (Signed)
 Oral Oncology Patient Advocate Encounter   Re-authorization   Received notification that prior authorization for brand Promacta  is required.   PA submitted on CMM via Latent Key [id number] Status is pending     Cheikh Bramble ([name]) Chet Burnet, CPhT  [facility], [place], [facility] - Oral Chemotherapy Patient Advocate Specialist III Phone: [phone]  Fax: [phone]

## 2024-08-03 NOTE — Telephone Encounter (Signed)
 Oral Oncology Patient Advocate Encounter  Prior Authorization for brand Promacta  has been approved.    PA# [id number] Effective dates: 08/03/2024 through 01/30/2025  Patients co-pay is $0.    [name] ([name]) Chet Burnet, CPhT  [facility], [place], [facility] - Oral Chemotherapy Patient Advocate Specialist III Phone: [phone]  Fax: [phone]

## 2024-08-03 NOTE — Progress Notes (Signed)
 Oral Oncology Patient Advocate Encounter  Prior authorization renewal approved for brand Promacta .  Taurus Alamo ([name]) Chet Burnet, CPhT  [facility], [place], [facility] - Oral Chemotherapy Patient Advocate Specialist III Phone: [phone]  Fax: [phone]

## 2024-08-26 ENCOUNTER — Other Ambulatory Visit (HOSPITAL_COMMUNITY): Payer: Self-pay

## 2024-09-07 ENCOUNTER — Other Ambulatory Visit: Payer: Self-pay

## 2024-09-07 NOTE — Progress Notes (Signed)
 Specialty Pharmacy Refill Coordination Note  [name] is a 89 y.o. male contacted today regarding refills of specialty medication(s) Eltrombopag  Olamine (PROMACTA )   Patient requested Delivery   Delivery date: 09/08/24   Verified address: [address] [place] [address]   Medication will be filled on: 09/07/24

## 2024-09-25 ENCOUNTER — Inpatient Hospital Stay: Attending: Internal Medicine

## 2024-09-25 ENCOUNTER — Encounter: Payer: Self-pay | Admitting: Internal Medicine

## 2024-09-25 DIAGNOSIS — D693 Immune thrombocytopenic purpura: Secondary | ICD-10-CM | POA: Diagnosis present

## 2024-09-25 DIAGNOSIS — D696 Thrombocytopenia, unspecified: Secondary | ICD-10-CM

## 2024-09-25 LAB — CBC WITH DIFFERENTIAL (CANCER CENTER ONLY)
Abs Immature Granulocytes: 0.04 K/uL (ref 0.00–0.07)
Basophils Absolute: 0 K/uL (ref 0.0–0.1)
Basophils Relative: 0 %
Eosinophils Absolute: 0.2 K/uL (ref 0.0–0.5)
Eosinophils Relative: 3 %
HCT: 45.3 % (ref 39.0–52.0)
Hemoglobin: 15.8 g/dL (ref 13.0–17.0)
Immature Granulocytes: 1 %
Lymphocytes Relative: 42 %
Lymphs Abs: 2.2 K/uL (ref 0.7–4.0)
MCH: 33.6 pg (ref 26.0–34.0)
MCHC: 34.9 g/dL (ref 30.0–36.0)
MCV: 96.4 fL (ref 80.0–100.0)
Monocytes Absolute: 0.5 K/uL (ref 0.1–1.0)
Monocytes Relative: 9 %
Neutro Abs: 2.4 K/uL (ref 1.7–7.7)
Neutrophils Relative %: 45 %
Platelet Count: 89 K/uL — ABNORMAL LOW (ref 150–400)
RBC: 4.7 MIL/uL (ref 4.22–5.81)
RDW: 13.2 % (ref 11.5–15.5)
WBC Count: 5.2 K/uL (ref 4.0–10.5)
nRBC: 0 % (ref 0.0–0.2)

## 2024-09-25 LAB — CMP (CANCER CENTER ONLY)
ALT: 23 U/L (ref 0–44)
AST: 30 U/L (ref 15–41)
Albumin: 4.4 g/dL (ref 3.5–5.0)
Alkaline Phosphatase: 63 U/L (ref 38–126)
Anion gap: 11 (ref 5–15)
BUN: 14 mg/dL (ref 8–23)
CO2: 27 mmol/L (ref 22–32)
Calcium: 9 mg/dL (ref 8.9–10.3)
Chloride: 106 mmol/L (ref 98–111)
Creatinine: 1.22 mg/dL (ref 0.61–1.24)
GFR, Estimated: 57 mL/min — ABNORMAL LOW
Glucose, Bld: 147 mg/dL — ABNORMAL HIGH (ref 70–99)
Potassium: 3.7 mmol/L (ref 3.5–5.1)
Sodium: 144 mmol/L (ref 135–145)
Total Bilirubin: 1.1 mg/dL (ref 0.0–1.2)
Total Protein: 6.5 g/dL (ref 6.5–8.1)

## 2024-09-25 LAB — LACTATE DEHYDROGENASE: LDH: 211 U/L (ref 105–235)

## 2024-09-25 NOTE — Progress Notes (Signed)
 Patients wife has questions regarding medication and the side effects he may be experiencing. Main concerns are unsteadiness and having BM accidents.

## 2024-09-28 ENCOUNTER — Other Ambulatory Visit

## 2024-09-28 ENCOUNTER — Inpatient Hospital Stay: Admitting: Internal Medicine

## 2024-09-28 VITALS — Ht 70.0 in

## 2024-09-28 DIAGNOSIS — D696 Thrombocytopenia, unspecified: Secondary | ICD-10-CM

## 2024-09-28 NOTE — Progress Notes (Signed)
 I connected with [name] on 09/28/24 at 10:30 AM EST by video enabled telemedicine visit and verified that I am speaking with the correct person using two identifiers.  I discussed the limitations, risks, security and privacy concerns of performing an evaluation and management service by telemedicine and the availability of in-person appointments. I also discussed with the patient that there may be a patient responsible charge related to this service. The patient expressed understanding and agreed to proceed.    Other persons participating in the visit and their role in the encounter: RN/medical reconciliation Patients location: home Providers location: home  Oncology History   No problem history exists.     Chief Complaint: Thrombocytopenia    History of present illness:[name] 89 y.o.  male with history of ITP; and Hx of subdural hematoma is here for a follow up.  Discussed the use of AI scribe software for clinical note transcription with the patient, who gave verbal consent to proceed.  History of Present Illness   [name] is an 89 year old male with immune thrombocytopenic purpura (ITP) who presents for hematology follow-up to assess disease stability and treatment side effects.  He remains on daily eltrombopag  for ITP, with platelet counts consistently maintained between 80 and 100 x10^9/L. He experiences mild diarrhea approximately once per week, which he attributes to eltrombopag . He denies new bleeding symptoms.  He continues to experience unsteadiness and brief episodes of dizziness occurring unpredictably about once per day, which have persisted since a prior subdural hematoma. He has not noted any change or worsening in these neurological symptoms.       Observation/objective: Alert & oriented x 3. In No acute distress.   Assessment and plan: Thrombocytopenia # Chronic thrombocytopenia isolated- > 100 [since 2015]; Likely ITP [although  mild splenomegaly]; less likely from alcohol/MDS.  No bone marrow biopsy. NOV 2024- NO improvement with dex 20 mg daily x5. Currently on promacta .  Goal: 80-100 [subdural hematoma]  # Today platelets in 89 - continue lower dose of promacta  at 25 mg- every day-plan indefinitely for now. Tolerating well except for mild diarrhea.   # Diarrhea- G-1-a week: sec to promacta - recommend imomdium prn.   # subdural hematoma-right meningeal artery embolization [s/p fall-needing- platelet transfusion at 80s in [facility]- Aug 2024; Dr.Cooke/Dr.Shah] -CT NOV  2024- stable. Intermittent dizziness- ? Related to fall- if worse- recommend reaching out to PCP/neurosurgery.    # DISPOSITION:  # follow up in 4  months - MD/labs-cbc/cmp/ldh- Dr.B  Cc; Dr.Bronstein  Follow-up instructions:  I discussed the assessment and treatment plan with the patient.  The patient was provided an opportunity to ask questions and all were answered.  The patient agreed with the plan and demonstrated understanding of instructions.  The patient was advised to call back or seek an in person evaluation if the symptoms worsen or if the condition fails to improve as anticipated.    Dr. Cindy Joe [facility] 09/28/2024 10:52 AM

## 2024-09-28 NOTE — Assessment & Plan Note (Addendum)
#   Chronic thrombocytopenia isolated- > 100 [since 2015]; Likely ITP [although mild splenomegaly]; less likely from alcohol/MDS.  No bone marrow biopsy. NOV 2024- NO improvement with dex 20 mg daily x5. Currently on promacta .  Goal: 80-100 [subdural hematoma]  # Today platelets in 89 - continue lower dose of promacta  at 25 mg- every day-plan indefinitely for now. Tolerating well except for mild diarrhea.   # Diarrhea- G-1-a week: sec to promacta - recommend imomdium prn.   # subdural hematoma-right meningeal artery embolization [s/p fall-needing- platelet transfusion at 80s in [facility]- Aug 2024; Dr.Cooke/Dr.Shah] -CT NOV  2024- stable. Intermittent dizziness- ? Related to fall- if worse- recommend reaching out to PCP/neurosurgery.    # DISPOSITION:  # follow up in 4  months - MD/labs-cbc/cmp/ldh- Dr.B  Cc; Dr.Bronstein

## 2024-09-29 NOTE — Addendum Note (Signed)
 Addended by: JOSHUA ALFONSO CROME on: 09/29/2024 10:59 AM   Modules accepted: Orders

## 2024-09-30 ENCOUNTER — Other Ambulatory Visit: Payer: Self-pay

## 2024-10-02 ENCOUNTER — Other Ambulatory Visit: Payer: Self-pay

## 2024-10-02 ENCOUNTER — Other Ambulatory Visit: Payer: Self-pay | Admitting: Pharmacy Technician

## 2024-10-02 NOTE — Progress Notes (Signed)
 Specialty Pharmacy Refill Coordination Note  [name] is a 89 y.o. male contacted today regarding refills of specialty medication(s) Eltrombopag  Olamine (PROMACTA )   Patient requested Delivery   Delivery date: 10/05/24   Verified address: [address] [place]   Medication will be filled on: 10/02/24

## 2024-10-06 ENCOUNTER — Other Ambulatory Visit: Payer: Self-pay | Admitting: Urology

## 2024-10-06 DIAGNOSIS — N401 Enlarged prostate with lower urinary tract symptoms: Secondary | ICD-10-CM

## 2024-12-11 ENCOUNTER — Ambulatory Visit: Admitting: Urology

## 2025-01-26 ENCOUNTER — Inpatient Hospital Stay

## 2025-01-26 ENCOUNTER — Inpatient Hospital Stay: Admitting: Internal Medicine
